# Patient Record
Sex: Male | Born: 1940 | Race: White | Hispanic: No | Marital: Married | Smoking: Former smoker
Health system: Southern US, Community
[De-identification: ages and names within clinical notes are randomized; demographics above are authoritative.]

## PROBLEM LIST (undated history)

## (undated) DIAGNOSIS — J449 Chronic obstructive pulmonary disease, unspecified: Secondary | ICD-10-CM

## (undated) DIAGNOSIS — D689 Coagulation defect, unspecified: Secondary | ICD-10-CM

## (undated) DIAGNOSIS — M1990 Unspecified osteoarthritis, unspecified site: Secondary | ICD-10-CM

## (undated) DIAGNOSIS — E785 Hyperlipidemia, unspecified: Secondary | ICD-10-CM

## (undated) DIAGNOSIS — I10 Essential (primary) hypertension: Secondary | ICD-10-CM

## (undated) DIAGNOSIS — I872 Venous insufficiency (chronic) (peripheral): Secondary | ICD-10-CM

## (undated) DIAGNOSIS — I719 Aortic aneurysm of unspecified site, without rupture: Secondary | ICD-10-CM

## (undated) DIAGNOSIS — K819 Cholecystitis, unspecified: Secondary | ICD-10-CM

## (undated) DIAGNOSIS — I714 Abdominal aortic aneurysm, without rupture, unspecified: Secondary | ICD-10-CM

## (undated) DIAGNOSIS — R918 Other nonspecific abnormal finding of lung field: Secondary | ICD-10-CM

## (undated) DIAGNOSIS — E669 Obesity, unspecified: Secondary | ICD-10-CM

## (undated) HISTORY — DX: Unspecified osteoarthritis, unspecified site: M19.90

## (undated) HISTORY — DX: Venous insufficiency (chronic) (peripheral): I87.2

## (undated) HISTORY — PX: CHOLECYSTECTOMY: SHX55

## (undated) HISTORY — DX: Other nonspecific abnormal finding of lung field: R91.8

## (undated) HISTORY — DX: Coagulation defect, unspecified: D68.9

## (undated) HISTORY — DX: Aortic aneurysm of unspecified site, without rupture: I71.9

## (undated) HISTORY — DX: Obesity, unspecified: E66.9

## (undated) HISTORY — DX: Abdominal aortic aneurysm, without rupture, unspecified: I71.40

## (undated) HISTORY — DX: Hyperlipidemia, unspecified: E78.5

## (undated) HISTORY — DX: Essential (primary) hypertension: I10

## (undated) HISTORY — DX: Abdominal aortic aneurysm, without rupture: I71.4

## (undated) HISTORY — DX: Cholecystitis, unspecified: K81.9

## (undated) HISTORY — PX: INGUINAL HERNIA REPAIR: SUR1180

---

## 2001-08-27 ENCOUNTER — Ambulatory Visit (HOSPITAL_COMMUNITY): Admission: RE | Admit: 2001-08-27 | Discharge: 2001-08-27 | Payer: Self-pay | Admitting: Internal Medicine

## 2001-08-27 ENCOUNTER — Encounter: Payer: Self-pay | Admitting: Internal Medicine

## 2001-09-27 ENCOUNTER — Ambulatory Visit (HOSPITAL_COMMUNITY): Admission: RE | Admit: 2001-09-27 | Discharge: 2001-09-27 | Payer: Self-pay | Admitting: Internal Medicine

## 2001-09-27 ENCOUNTER — Encounter: Payer: Self-pay | Admitting: Internal Medicine

## 2002-02-14 HISTORY — PX: TESTICLE SURGERY: SHX794

## 2002-04-06 ENCOUNTER — Emergency Department (HOSPITAL_COMMUNITY): Admission: EM | Admit: 2002-04-06 | Discharge: 2002-04-06 | Payer: Self-pay | Admitting: *Deleted

## 2002-06-04 ENCOUNTER — Ambulatory Visit (HOSPITAL_COMMUNITY): Admission: RE | Admit: 2002-06-04 | Discharge: 2002-06-04 | Payer: Self-pay | Admitting: Internal Medicine

## 2002-06-04 ENCOUNTER — Encounter: Payer: Self-pay | Admitting: Internal Medicine

## 2003-07-03 ENCOUNTER — Ambulatory Visit (HOSPITAL_COMMUNITY): Admission: RE | Admit: 2003-07-03 | Discharge: 2003-07-03 | Payer: Self-pay | Admitting: General Surgery

## 2003-07-04 ENCOUNTER — Ambulatory Visit (HOSPITAL_COMMUNITY): Admission: RE | Admit: 2003-07-04 | Discharge: 2003-07-04 | Payer: Self-pay | Admitting: General Surgery

## 2003-07-24 ENCOUNTER — Ambulatory Visit (HOSPITAL_COMMUNITY): Admission: RE | Admit: 2003-07-24 | Discharge: 2003-07-24 | Payer: Self-pay | Admitting: General Surgery

## 2003-12-29 ENCOUNTER — Ambulatory Visit (HOSPITAL_COMMUNITY): Admission: RE | Admit: 2003-12-29 | Discharge: 2003-12-29 | Payer: Self-pay | Admitting: Internal Medicine

## 2004-02-11 ENCOUNTER — Ambulatory Visit: Payer: Self-pay | Admitting: *Deleted

## 2004-03-15 ENCOUNTER — Ambulatory Visit (HOSPITAL_COMMUNITY): Admission: RE | Admit: 2004-03-15 | Discharge: 2004-03-15 | Payer: Self-pay | Admitting: Internal Medicine

## 2004-05-04 ENCOUNTER — Encounter (HOSPITAL_COMMUNITY): Admission: RE | Admit: 2004-05-04 | Discharge: 2004-06-03 | Payer: Self-pay | Admitting: Neurosurgery

## 2005-04-29 ENCOUNTER — Encounter: Payer: Self-pay | Admitting: Cardiology

## 2005-08-18 ENCOUNTER — Inpatient Hospital Stay (HOSPITAL_COMMUNITY): Admission: AD | Admit: 2005-08-18 | Discharge: 2005-08-27 | Payer: Self-pay | Admitting: Internal Medicine

## 2005-08-28 ENCOUNTER — Ambulatory Visit (HOSPITAL_COMMUNITY): Admission: RE | Admit: 2005-08-28 | Discharge: 2005-08-28 | Payer: Self-pay | Admitting: Urology

## 2005-08-29 ENCOUNTER — Ambulatory Visit (HOSPITAL_COMMUNITY): Payer: Self-pay | Admitting: Internal Medicine

## 2005-08-30 ENCOUNTER — Ambulatory Visit (HOSPITAL_COMMUNITY): Payer: Self-pay | Admitting: Oncology

## 2005-08-30 ENCOUNTER — Encounter (HOSPITAL_COMMUNITY): Admission: RE | Admit: 2005-08-30 | Discharge: 2005-09-29 | Payer: Self-pay | Admitting: Internal Medicine

## 2006-01-03 ENCOUNTER — Encounter (INDEPENDENT_AMBULATORY_CARE_PROVIDER_SITE_OTHER): Payer: Self-pay | Admitting: *Deleted

## 2006-01-03 ENCOUNTER — Ambulatory Visit (HOSPITAL_COMMUNITY): Admission: RE | Admit: 2006-01-03 | Discharge: 2006-01-03 | Payer: Self-pay | Admitting: Urology

## 2007-06-01 ENCOUNTER — Ambulatory Visit: Payer: Self-pay | Admitting: Vascular Surgery

## 2008-01-03 ENCOUNTER — Ambulatory Visit (HOSPITAL_COMMUNITY): Admission: RE | Admit: 2008-01-03 | Discharge: 2008-01-03 | Payer: Self-pay | Admitting: Ophthalmology

## 2008-07-18 ENCOUNTER — Ambulatory Visit: Payer: Self-pay | Admitting: Vascular Surgery

## 2008-10-11 ENCOUNTER — Inpatient Hospital Stay (HOSPITAL_COMMUNITY): Admission: EM | Admit: 2008-10-11 | Discharge: 2008-10-13 | Payer: Self-pay | Admitting: Emergency Medicine

## 2008-10-22 ENCOUNTER — Encounter (INDEPENDENT_AMBULATORY_CARE_PROVIDER_SITE_OTHER): Payer: Self-pay | Admitting: General Surgery

## 2008-10-22 ENCOUNTER — Ambulatory Visit (HOSPITAL_COMMUNITY): Admission: RE | Admit: 2008-10-22 | Discharge: 2008-10-22 | Payer: Self-pay | Admitting: General Surgery

## 2010-03-26 ENCOUNTER — Ambulatory Visit (HOSPITAL_COMMUNITY)
Admission: RE | Admit: 2010-03-26 | Discharge: 2010-03-26 | Disposition: A | Discharge status: Home / Self Care | Payer: Medicare Other | Source: Ambulatory Visit | Attending: Family Medicine | Admitting: Family Medicine

## 2010-03-26 ENCOUNTER — Encounter (HOSPITAL_COMMUNITY): Payer: Self-pay

## 2010-03-26 ENCOUNTER — Other Ambulatory Visit (HOSPITAL_COMMUNITY): Payer: Self-pay | Admitting: Family Medicine

## 2010-03-26 DIAGNOSIS — R0789 Other chest pain: Secondary | ICD-10-CM | POA: Insufficient documentation

## 2010-03-26 DIAGNOSIS — W19XXXA Unspecified fall, initial encounter: Secondary | ICD-10-CM | POA: Insufficient documentation

## 2010-03-26 DIAGNOSIS — S8000XA Contusion of unspecified knee, initial encounter: Secondary | ICD-10-CM | POA: Insufficient documentation

## 2010-03-26 DIAGNOSIS — M25569 Pain in unspecified knee: Secondary | ICD-10-CM | POA: Insufficient documentation

## 2010-03-26 DIAGNOSIS — S298XXA Other specified injuries of thorax, initial encounter: Secondary | ICD-10-CM | POA: Insufficient documentation

## 2010-04-12 ENCOUNTER — Other Ambulatory Visit (HOSPITAL_COMMUNITY): Payer: Self-pay | Admitting: Internal Medicine

## 2010-04-12 DIAGNOSIS — R0781 Pleurodynia: Secondary | ICD-10-CM

## 2010-04-14 ENCOUNTER — Ambulatory Visit (HOSPITAL_COMMUNITY)
Admission: RE | Admit: 2010-04-14 | Discharge: 2010-04-14 | Disposition: A | Discharge status: Home / Self Care | Payer: Medicare Other | Source: Ambulatory Visit | Attending: Internal Medicine | Admitting: Internal Medicine

## 2010-04-14 DIAGNOSIS — R0781 Pleurodynia: Secondary | ICD-10-CM

## 2010-04-14 DIAGNOSIS — S2249XA Multiple fractures of ribs, unspecified side, initial encounter for closed fracture: Secondary | ICD-10-CM | POA: Insufficient documentation

## 2010-04-14 DIAGNOSIS — R918 Other nonspecific abnormal finding of lung field: Secondary | ICD-10-CM | POA: Insufficient documentation

## 2010-04-14 DIAGNOSIS — W19XXXA Unspecified fall, initial encounter: Secondary | ICD-10-CM | POA: Insufficient documentation

## 2010-04-14 DIAGNOSIS — R0789 Other chest pain: Secondary | ICD-10-CM | POA: Insufficient documentation

## 2010-04-14 DIAGNOSIS — I712 Thoracic aortic aneurysm, without rupture, unspecified: Secondary | ICD-10-CM | POA: Insufficient documentation

## 2010-04-14 DIAGNOSIS — M549 Dorsalgia, unspecified: Secondary | ICD-10-CM | POA: Insufficient documentation

## 2010-04-14 MED ORDER — IOHEXOL 350 MG/ML SOLN: 100.0000 mL | Freq: Once | INTRAVENOUS | Status: AC | PRN

## 2010-04-21 ENCOUNTER — Encounter: Payer: Medicare Other | Admitting: Thoracic Surgery (Cardiothoracic Vascular Surgery)

## 2010-04-28 ENCOUNTER — Encounter (INDEPENDENT_AMBULATORY_CARE_PROVIDER_SITE_OTHER): Payer: Medicare Other | Admitting: Thoracic Surgery (Cardiothoracic Vascular Surgery)

## 2010-04-28 DIAGNOSIS — I712 Thoracic aortic aneurysm, without rupture: Secondary | ICD-10-CM

## 2010-04-28 DIAGNOSIS — D381 Neoplasm of uncertain behavior of trachea, bronchus and lung: Secondary | ICD-10-CM

## 2010-04-29 NOTE — Letter (Signed)
April 28, 2010  Madelin Rear. Sherwood Gambler, MD [address] [place], [place] [address]  Re:  [name], [name]                DOB:  [birth year]  Dear Dr. Sherwood Gambler:  Thank you very much for sending the patient for consultation.  As you know, he recently had a CT scan after suffering a fall, which showed a 4.2 cm ascending aorta.  He has already been followed for an abdominal aortic aneurysm by Dr. Gretta Began, but that has not which is necessitating surgery.  He also was noted to have a couple of small lung nodules 5 mm and 3 mm respectively on the CT scan.  There is no indication for surgery on either the lung nodules or the ascending aorta at this point in time, and I have recommended that we will repeat a CT angio in 6 months to look at both the aorta and lung nodules, and I will plan to see him back in the office at that time.  Again, thank you for referring the patient, and please do not hesitate to contact me if I could be of any further assistance with his care in the interim.  Salvatore Decent Dorris Fetch, M.D. Electronically Signed  SCH/MEDQ  D:  04/28/2010  T:  04/29/2010  Job:  [id number]  cc:   Ramon Dredge L. Juanetta Gosling, M.D.

## 2010-04-29 NOTE — Consult Note (Signed)
NEW PATIENT CONSULTATION  [name], [name] DOB:  May 31, 1940                                        April 28, 2010 CHART #:  [id number]  REASON FOR CONSULTATION:  Ascending aortic aneurysm.  HISTORY OF PRESENT ILLNESS:  The patient is a 70 year old gentleman with a history of abdominal aortic aneurysm which is followed by Dr. Tawanna Cooler Early, hypercholesterolemia, and DVT and venous insufficiency.  He was in his usual state of health until off 4 weeks ago when he lost his balance and fell, he hit his left knee, and the left side of his chest, he suffered multiple rib fractures, and evaluating him after that fall, a CT was done on April 14, 2010, which showed an enlarged ascending aorta at 4.2 cm.  Multiple rib fractures were noted, nondisplaced.  He was also noted to have 2 small lung nodules, one being 5 mm in size in the right lower lobe and one being 3 mm in size in the left lower lobe. There were no other significant findings.  PAST MEDICAL HISTORY:  Significant for abdominal aortic aneurysm, size unknown followed by Dr. Tawanna Cooler Early, hypercholesterolemia, DVT, varicose veins, chronic venous insufficiency, arthritis, remote tobacco abuse.  CURRENT MEDICATIONS: 1. Coumadin 9.5 mg daily. 2. Gabapentin 100 mg p.o. t.i.d. 3. Lasix 20 mg p.o. t.i.d.  He has allergies to PENICILLIN, SULFA, CLARITIN, and ASPIRIN.  FAMILY HISTORY:  Significant for his mother having heart attack at age 58.  SOCIAL HISTORY:  He is married with two grown children.  Lives with his wife.  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  He is a former smoker, he quit in 2001, and he does not drink alcohol.  REVIEW OF SYSTEMS:  Peripheral edema, pain in his legs, tiredness in his legs from varicose veins, back pain requiring gabapentin.  All other systems are negative.  PHYSICAL EXAMINATION:  GENERAL:  The patient is an obese 70 year old gentleman in no acute distress. VITAL  SIGNS:  Blood pressure is 116/81, pulse 51, respirations are 20, oxygen saturation is 94% on room air. NEUROLOGICAL:  He is alert and oriented x3 with no focal deficits. HEENT:  Unremarkable. NECK:  Supple without thyromegaly, adenopathy, or bruits. CARDIAC:  Has regular rate and rhythm.  Normal S1 and S2.  No murmurs, rubs, or gallops. LUNGS:  Clear with diminished, but equal breath sounds bilaterally.  He has severe venous insufficiency and severe varicosities in both lower extremities, not able to palpate dorsalis pedis or posterior tibial pulses in either leg.  CT of the chest is reviewed.  Findings as previously noted.  Other laboratory data showed a hemoglobin of 12.9, platelet count 339, sodium 140, potassium 4.6, BUN 21, creatinine 0.93, white count is 5.2.  TSH 0.771.  PSA 1.63, hemoglobin A1c 6.1%.  IMPRESSION:  The patient is a 70 year old gentleman, found on CT scan incidentally to have a 4.2-cm ascending aorta, this is definitely enlarged.  There is no indication for surgical treatment, but does need to be followed to rule out subsequent enlargement.  He is currently already being followed for an abdominal aortic aneurysm by Dr. Tawanna Cooler Early with 58-month ultrasounds.  He also was noted to have two small lung nodules; 5 mm in the right lower lobe, and 3 mm in the left lower lobe.  These lesions also  should be followed given his smoking history. Given the size of the lesions in his lungs and the smoking history, I think he should have a repeat CT scan in 6 months.  Apparently, Dr. Juanetta Gosling is recommend a scan at 3 months which is fine, but he will need a CT at 6 months for his aorta in any event, so hopefully we can just do one scan at that time, but we will defer to Dr. Juanetta Gosling, to see this physician following the lung lesions specifically.  I will plan to see the patient back in 6 months with a CT angio of the chest at that time.  Salvatore Decent Dorris Fetch, M.D. Electronically  Signed  SCH/MEDQ  D:  04/28/2010  T:  04/29/2010  Job:  [id number]  cc:   Madelin Rear. Sherwood Gambler, MD Oneal Deputy Juanetta Gosling, M.D.

## 2010-05-18 ENCOUNTER — Ambulatory Visit (HOSPITAL_COMMUNITY)
Admission: RE | Admit: 2010-05-18 | Discharge: 2010-05-18 | Disposition: A | Discharge status: Home / Self Care | Payer: Medicare Other | Source: Ambulatory Visit | Attending: Orthopaedic Surgery | Admitting: Orthopaedic Surgery

## 2010-05-18 DIAGNOSIS — S81809A Unspecified open wound, unspecified lower leg, initial encounter: Secondary | ICD-10-CM | POA: Insufficient documentation

## 2010-05-18 DIAGNOSIS — IMO0001 Reserved for inherently not codable concepts without codable children: Secondary | ICD-10-CM | POA: Insufficient documentation

## 2010-05-18 DIAGNOSIS — S81009A Unspecified open wound, unspecified knee, initial encounter: Secondary | ICD-10-CM | POA: Insufficient documentation

## 2010-05-20 ENCOUNTER — Ambulatory Visit (HOSPITAL_COMMUNITY)
Admission: RE | Admit: 2010-05-20 | Discharge: 2010-05-20 | Disposition: A | Discharge status: Home / Self Care | Payer: Medicare Other | Source: Ambulatory Visit | Admitting: Physical Therapy

## 2010-05-21 LAB — PROTIME-INR: INR: 1 (ref 0.00–1.49)

## 2010-05-22 LAB — URINALYSIS, MICROSCOPIC ONLY
Bilirubin Urine: NEGATIVE
Ketones, ur: NEGATIVE mg/dL
Leukocytes, UA: NEGATIVE
Nitrite: NEGATIVE
Specific Gravity, Urine: 1.035 — ABNORMAL HIGH (ref 1.005–1.030)
Urobilinogen, UA: 0.2 mg/dL (ref 0.0–1.0)

## 2010-05-22 LAB — DIFFERENTIAL
Basophils Absolute: 0 10*3/uL (ref 0.0–0.1)
Basophils Relative: 0 % (ref 0–1)
Eosinophils Absolute: 0 10*3/uL (ref 0.0–0.7)
Eosinophils Relative: 1 % (ref 0–5)
Lymphocytes Relative: 11 % — ABNORMAL LOW (ref 12–46)
Lymphs Abs: 0.9 10*3/uL (ref 0.7–4.0)
Monocytes Absolute: 0.5 10*3/uL (ref 0.1–1.0)
Monocytes Relative: 7 % (ref 3–12)
Neutrophils Relative %: 81 % — ABNORMAL HIGH (ref 43–77)

## 2010-05-22 LAB — BASIC METABOLIC PANEL
BUN: 12 mg/dL (ref 6–23)
Creatinine, Ser: 1.05 mg/dL (ref 0.4–1.5)
GFR calc non Af Amer: >60 mL/min (ref >60)
GFR calc non Af Amer: >60 mL/min (ref >60)
Glucose, Bld: 102 mg/dL — ABNORMAL HIGH (ref 70–99)
Glucose, Bld: 123 mg/dL — ABNORMAL HIGH (ref 70–99)
Potassium: 4.1 mEq/L (ref 3.5–5.1)
Potassium: 4.3 mEq/L (ref 3.5–5.1)
Sodium: 138 mEq/L (ref 135–145)

## 2010-05-22 LAB — HEPATIC FUNCTION PANEL
ALT: 20 U/L (ref 0–53)
AST: 32 U/L (ref 0–37)
Albumin: 3.6 g/dL (ref 3.5–5.2)
Alkaline Phosphatase: 41 U/L (ref 39–117)
Bilirubin, Direct: 0.2 mg/dL (ref 0.0–0.3)
Total Bilirubin: 1 mg/dL (ref 0.3–1.2)

## 2010-05-22 LAB — BRAIN NATRIURETIC PEPTIDE: Pro B Natriuretic peptide (BNP): 96 pg/mL (ref 0.0–100.0)

## 2010-05-22 LAB — CK TOTAL AND CKMB (NOT AT ARMC)
CK, MB: 1.9 ng/mL (ref 0.3–4.0)
Relative Index: 1.2 (ref 0.0–2.5)
Relative Index: 1.7 (ref 0.0–2.5)
Total CK: 163 U/L (ref 7–232)

## 2010-05-22 LAB — TROPONIN I
Troponin I: 0.01 ng/mL (ref 0.00–0.06)
Troponin I: 0.02 ng/mL (ref 0.00–0.06)

## 2010-05-22 LAB — CBC
HCT: 42.6 % (ref 39.0–52.0)
Hemoglobin: 14.4 g/dL (ref 13.0–17.0)
RDW: 14.6 % (ref 11.5–15.5)
WBC: 7.7 10*3/uL (ref 4.0–10.5)

## 2010-05-22 LAB — MAGNESIUM: Magnesium: 1.8 mg/dL (ref 1.5–2.5)

## 2010-05-22 LAB — POCT CARDIAC MARKERS: Troponin i, poc: <0.05 ng/mL (ref 0.00–0.09)

## 2010-05-22 LAB — PROTIME-INR: INR: 3 — ABNORMAL HIGH (ref 0.00–1.49)

## 2010-05-24 ENCOUNTER — Ambulatory Visit (HOSPITAL_COMMUNITY)
Admission: RE | Admit: 2010-05-24 | Discharge: 2010-05-24 | Disposition: A | Discharge status: Home / Self Care | Payer: Medicare Other | Source: Ambulatory Visit | Attending: *Deleted | Admitting: *Deleted

## 2010-05-26 ENCOUNTER — Ambulatory Visit (HOSPITAL_COMMUNITY)
Admission: RE | Admit: 2010-05-26 | Discharge: 2010-05-26 | Disposition: A | Discharge status: Home / Self Care | Payer: Medicare Other | Source: Ambulatory Visit | Attending: *Deleted | Admitting: *Deleted

## 2010-05-28 ENCOUNTER — Ambulatory Visit (HOSPITAL_COMMUNITY)
Admission: RE | Admit: 2010-05-28 | Discharge: 2010-05-28 | Disposition: A | Discharge status: Home / Self Care | Payer: Medicare Other | Source: Ambulatory Visit

## 2010-05-28 ENCOUNTER — Encounter: Payer: Self-pay | Admitting: Cardiology

## 2010-05-31 ENCOUNTER — Encounter: Payer: Self-pay | Admitting: Cardiology

## 2010-05-31 ENCOUNTER — Ambulatory Visit (INDEPENDENT_AMBULATORY_CARE_PROVIDER_SITE_OTHER): Payer: Medicare Other | Admitting: Cardiology

## 2010-05-31 ENCOUNTER — Ambulatory Visit (HOSPITAL_COMMUNITY)
Admission: RE | Admit: 2010-05-31 | Discharge: 2010-05-31 | Disposition: A | Discharge status: Home / Self Care | Payer: Medicare Other | Source: Ambulatory Visit | Attending: Internal Medicine | Admitting: Internal Medicine

## 2010-05-31 VITALS — BP 142/72 | HR 71 | Ht 70.0 in | Wt 272.0 lb

## 2010-05-31 DIAGNOSIS — R0600 Dyspnea, unspecified: Secondary | ICD-10-CM

## 2010-05-31 DIAGNOSIS — R0602 Shortness of breath: Secondary | ICD-10-CM

## 2010-05-31 NOTE — Patient Instructions (Addendum)
Your physician recommends that you schedule a follow-up appointment as needed   Your physician has requested that you have a lexiscan myoview. For further information please visit [email or web address]. Please follow instruction sheet, as given.    

## 2010-05-31 NOTE — Progress Notes (Signed)
   Patient ID: [name], male    DOB: Nov 20, 1940, 70 y.o.   MRN: [id number]  HPI Mr Buckwalter is a 70 yo MWM referred by Dr Sherwood Gambler for DOE. He has multiple CRF's including remote tobacco, obesity, family history, and sedentary life style. He recently fell in February and broke several ribs and injured his left knee. He has suffered from chronic lower extremity edema fro chronic venous insufficiency. He has a history of DVT and is on Coumadin. Recent Chest CT on Feb 29/12 showed no pulmonary embolus, AAA measuring 4.3 x 4.2 which is followed by Dr Dorris Fetch, and bilateral lowere lobe nodules followed by Dr Juanetta Gosling.  He denies angina but continues to have significant DOE.  EKG today shows NSR with LAD.    Review of Systems  Constitutional: Positive for activity change.  Eyes: Negative.   Respiratory: Positive for shortness of breath. Negative for chest tightness.   Cardiovascular: Negative for palpitations and leg swelling.  Genitourinary: Negative.   Musculoskeletal: Positive for gait problem.  Neurological: Negative for dizziness, syncope, speech difficulty, weakness and light-headedness.  Hematological: Negative.   Psychiatric/Behavioral: Negative.       Physical Exam  Constitutional: He is oriented to person, place, and time. He appears well-developed and well-nourished. No distress.       Morbidly obese  HENT:  Head: Atraumatic.  Eyes: EOM are normal. Pupils are equal, round, and reactive to light.  Neck: Normal range of motion. No JVD present. No tracheal deviation present. No thyromegaly present.       No bruits  Cardiovascular: Normal rate, regular rhythm, normal heart sounds and intact distal pulses.   No murmur heard. Pulmonary/Chest: Effort normal and breath sounds normal.  Abdominal: Soft. Bowel sounds are normal.       No bruit  Musculoskeletal: He exhibits edema.       Decreased ROM Chronic edematous changes  Neurological: He is alert and oriented to person,  place, and time.  Skin: Skin is warm and dry.

## 2010-06-02 ENCOUNTER — Ambulatory Visit (HOSPITAL_COMMUNITY)
Admission: RE | Admit: 2010-06-02 | Discharge: 2010-06-02 | Disposition: A | Discharge status: Home / Self Care | Payer: Medicare Other | Source: Ambulatory Visit | Attending: Internal Medicine | Admitting: Internal Medicine

## 2010-06-04 ENCOUNTER — Ambulatory Visit (HOSPITAL_COMMUNITY)
Admission: RE | Admit: 2010-06-04 | Discharge: 2010-06-04 | Disposition: A | Discharge status: Home / Self Care | Payer: Medicare Other | Source: Ambulatory Visit | Admitting: Physical Therapy

## 2010-06-07 ENCOUNTER — Ambulatory Visit (HOSPITAL_COMMUNITY)
Admission: RE | Admit: 2010-06-07 | Discharge: 2010-06-07 | Disposition: A | Discharge status: Home / Self Care | Payer: Medicare Other | Source: Ambulatory Visit | Attending: Internal Medicine | Admitting: Internal Medicine

## 2010-06-09 ENCOUNTER — Ambulatory Visit (HOSPITAL_COMMUNITY)
Admission: RE | Admit: 2010-06-09 | Discharge: 2010-06-09 | Disposition: A | Discharge status: Home / Self Care | Payer: Medicare Other | Source: Ambulatory Visit | Attending: Internal Medicine | Admitting: Internal Medicine

## 2010-06-09 ENCOUNTER — Other Ambulatory Visit (HOSPITAL_COMMUNITY): Payer: Medicare Other | Admitting: Radiology

## 2010-06-11 ENCOUNTER — Ambulatory Visit (HOSPITAL_COMMUNITY)
Admission: RE | Admit: 2010-06-11 | Discharge: 2010-06-11 | Disposition: A | Discharge status: Home / Self Care | Payer: Medicare Other | Source: Ambulatory Visit | Attending: Internal Medicine | Admitting: Internal Medicine

## 2010-06-15 ENCOUNTER — Ambulatory Visit (HOSPITAL_COMMUNITY)
Admission: RE | Admit: 2010-06-15 | Discharge: 2010-06-15 | Disposition: A | Discharge status: Home / Self Care | Payer: Medicare Other | Source: Ambulatory Visit | Attending: Orthopaedic Surgery | Admitting: Orthopaedic Surgery

## 2010-06-15 DIAGNOSIS — S81009A Unspecified open wound, unspecified knee, initial encounter: Secondary | ICD-10-CM | POA: Insufficient documentation

## 2010-06-15 DIAGNOSIS — IMO0001 Reserved for inherently not codable concepts without codable children: Secondary | ICD-10-CM | POA: Insufficient documentation

## 2010-06-16 ENCOUNTER — Ambulatory Visit (HOSPITAL_COMMUNITY)
Admission: RE | Admit: 2010-06-16 | Discharge: 2010-06-16 | Disposition: A | Discharge status: Home / Self Care | Payer: Medicare Other | Source: Ambulatory Visit | Attending: Internal Medicine | Admitting: Internal Medicine

## 2010-06-17 ENCOUNTER — Ambulatory Visit (HOSPITAL_COMMUNITY)
Admission: RE | Admit: 2010-06-17 | Discharge: 2010-06-17 | Disposition: A | Discharge status: Home / Self Care | Payer: Medicare Other | Source: Ambulatory Visit | Attending: Internal Medicine | Admitting: Internal Medicine

## 2010-06-21 ENCOUNTER — Ambulatory Visit (HOSPITAL_COMMUNITY)
Admission: RE | Admit: 2010-06-21 | Discharge: 2010-06-21 | Disposition: A | Discharge status: Home / Self Care | Payer: Medicare Other | Source: Ambulatory Visit | Attending: Internal Medicine | Admitting: Internal Medicine

## 2010-06-23 ENCOUNTER — Ambulatory Visit (HOSPITAL_COMMUNITY): Payer: Medicare Other | Admitting: *Deleted

## 2010-06-24 ENCOUNTER — Ambulatory Visit (HOSPITAL_COMMUNITY)
Admission: RE | Admit: 2010-06-24 | Discharge: 2010-06-24 | Disposition: A | Discharge status: Home / Self Care | Payer: Medicare Other | Source: Ambulatory Visit | Attending: Internal Medicine | Admitting: Internal Medicine

## 2010-06-25 ENCOUNTER — Ambulatory Visit (HOSPITAL_COMMUNITY): Payer: Medicare Other | Admitting: Physical Therapy

## 2010-06-28 ENCOUNTER — Ambulatory Visit (HOSPITAL_COMMUNITY): Payer: Medicare Other | Attending: Cardiology | Admitting: Radiology

## 2010-06-28 VITALS — Ht 69.5 in | Wt 280.0 lb

## 2010-06-28 DIAGNOSIS — R0602 Shortness of breath: Secondary | ICD-10-CM

## 2010-06-28 DIAGNOSIS — R0989 Other specified symptoms and signs involving the circulatory and respiratory systems: Secondary | ICD-10-CM | POA: Insufficient documentation

## 2010-06-28 DIAGNOSIS — R0609 Other forms of dyspnea: Secondary | ICD-10-CM | POA: Insufficient documentation

## 2010-06-28 DIAGNOSIS — R0600 Dyspnea, unspecified: Secondary | ICD-10-CM

## 2010-06-28 MED ORDER — TECHNETIUM TC 99M TETROFOSMIN IV KIT
33.0000 | PACK | Freq: Once | INTRAVENOUS | Status: AC | PRN
  Administered 2010-06-28: 33 via INTRAVENOUS

## 2010-06-28 MED ORDER — TECHNETIUM TC 99M TETROFOSMIN IV KIT
10.9000 | PACK | Freq: Once | INTRAVENOUS | Status: AC | PRN
  Administered 2010-06-28: 10.9 via INTRAVENOUS

## 2010-06-28 MED ORDER — REGADENOSON 0.4 MG/5ML IV SOLN
0.4000 mg | Freq: Once | INTRAVENOUS | Status: AC
  Administered 2010-06-28: 0.4 mg via INTRAVENOUS

## 2010-06-28 NOTE — Progress Notes (Signed)
[facility] SITE 3 NUCLEAR MED [address] [place] [address] [phone]  Cardiology Nuclear Med Study  [name] is a 70 y.o. male [id number] [birth year]   Nuclear Med Background Indication for Stress Test:  Evaluation for Ischemia History:  AAA  4.3 Cardiac Risk Factors: Family History - CAD, History of Smoking, Lipids and NIDDM  Symptoms:  DOE and SOB   Nuclear Pre-Procedure Caffeine/Decaff Intake:  None NPO After: 7:00am   Lungs:  clear IV 0.9% NS with Angio Cath:  20g  IV Site: R Antecubital  IV Started by:  Stanton Kidney, EMT-P  Chest Size (in):  52 Cup Size: n/a  Height: 5' 9.5" (1.765 m)  Weight:  280 lb (127.007 kg)  BMI:  Body mass index is 40.76 kg/(m^2). Tech Comments:  NA    Nuclear Med Study 1 or 2 day study: 1 day  Stress Test Type:  Eugenie Birks  Reading MD: Dietrich Pates, MD  Order Authorizing Provider:  T.Wall  Resting Radionuclide: Technetium 82m Tetrofosmin  Resting Radionuclide Dose: 10.9 mCi   Stress Radionuclide:  Technetium 60m Tetrofosmin  Stress Radionuclide Dose: 33 mCi           Stress Protocol Rest HR: 67 Stress HR: 76  Rest BP: 128/72 Stress BP: 135/71  Exercise Time (min): n/a METS: n/a   Predicted Max HR: 151 bpm % Max HR: 50.33 bpm Rate Pressure Product: 21308   Dose of Adenosine (mg):  n/a Dose of Lexiscan: 0.4 mg  Dose of Atropine (mg): n/a Dose of Dobutamine: n/a mcg/kg/min (at max HR)  Stress Test Technologist: Milana Na, EMT-P  Nuclear Technologist:  Domenic Polite, CNMT     Rest Procedure:  Myocardial perfusion imaging was performed at rest 45 minutes following the intravenous administration of Technetium 91m Tetrofosmin. Rest ECG: NSR  Stress Procedure:  The patient received IV Lexiscan 0.4 mg over 15-seconds.  Technetium 47m Tetrofosmin injected at 30-seconds.  There were no significant changes and occ pacs with Lexiscan.  Quantitative spect images were obtained after a 45 minute  delay. Stress ECG: No significant change from baseline ECG  QPS Raw Data Images:  Soft tissue (diaphragm) underlies heart. Stress Images:  Thinning with decreased counts in the inferior/inferolateral wall (base, mid); minimally at apex. Rest Images:  Comparison with the stress images reveals no significant change. Subtraction (SDS):  No evidence of ischemia. Transient Ischemic Dilatation (Normal <1.22):  1.19 Lung/Heart Ratio (Normal <0.45):  .28   Quantitative Gated Spect Images QGS EDV:  110 ml QGS ESV:  42 ml QGS cine images:  NL LV Function; NL Wall Motion QGS EF: 62%  Impression Exercise Capacity:  Lexiscan with no exercise. BP Response:  Normal blood pressure response. Clinical Symptoms:  No chest pain. ECG Impression:  No significant change from baseline. Comparison with Prior Nuclear Study: No previous nuclear study performed  Overall Impression:  Probable normal perfusion and mild soft tissue attenuation (diaphragm).  No ischemia.

## 2010-06-29 ENCOUNTER — Ambulatory Visit (HOSPITAL_COMMUNITY)
Admission: RE | Admit: 2010-06-29 | Discharge: 2010-06-29 | Disposition: A | Discharge status: Home / Self Care | Payer: Medicare Other | Source: Ambulatory Visit | Attending: Internal Medicine | Admitting: Internal Medicine

## 2010-06-29 NOTE — Discharge Summary (Signed)
NAMEADAMA, FERBER                ACCOUNT NO.:  [id number]   MEDICAL RECORD NO.:  [id number]          PATIENT TYPE:  INP   LOCATION:  3736                         FACILITY:  [facility]   PHYSICIAN:  Charlestine Massed, MDDATE OF BIRTH:  11/02/1940   DATE OF ADMISSION:  10/11/2008  DATE OF DISCHARGE:  10/13/2008                               DISCHARGE SUMMARY   PRIMARY CARE PHYSICIAN:  Madelin Rear. Sherwood Gambler, MD.   Leandra KernSandria Bales. Ezzard Standing, M.D.  Dalia Heading, M.D. at [facility].  Larina Earthly, M.D., vascular surgeon.   REASON FOR ADMISSION:  Pain in the epigastrium/chest.   DISCHARGE DIAGNOSIS:  1. Acute biliary colic, currently resolved.  2. Multiple gallstones with gallbladder outlet obstruction, currently      resolved, cholelithiasis.  3. Obesity.  4. Chronic lower extremity venous insufficiency on prophylactic      Coumadin therapy.  5. Abdominal aortic aneurysm at 3 cm, currently stable.  6. Previous history of smoking, currently no lung issues.  7. Previous history of left hydrocelectomy, circumcision and left      internal hernia repair.   DISCHARGE MEDICATIONS:  1. Cipro 500 mg p.o. b.i.d. for 3 days.  2. Tylenol 650 mg p.o. 4 times daily as needed for pain.  3. Coumadin 9.5 mg p.o. daily.  4. Lasix 60 mg p.o. daily.  5. Gabapentin 100 mg p.o. t.i.d.   HOSPITAL COURSE:  1. Cholelithiasis, acute biliary colic.  Mr. Pookela Sellin is a 70-year-      old gentleman who is obese and who came to the emergency room      because he experienced sudden onset of pain in the lower chest at      the epigastric area which was radiation between the shoulder blades      and to the midabdomen since the 11 o'clock on the night of the 27th      and so he came to the emergency room.  Pain was very sharp in      nature with a severity of 9-10/10 with no aggravating or relieving      factors.  He has not experienced this kind of pain before  The      patient was examined in the emergency  room.  The initial thought      was to rule out any dissection of the aneurysm as patient has prior      history of abdominal aortic aneurysm and the pain was in the chest      as well as the abdomen.  With the patient's history of lower      extremity venous insufficiency, a PE was also thought by the      emergency room but his INR was therapeutic.  The patient had a CT      chest done which was negative for pulmonary embolism and a CT      angiogram done for the dissection ruled out any thoracic or      abdominal aortic dissection and the aneurysm of his abdominal aorta      was stable.  The  patient's CAT scan also revealed evidence of a      distended gallbladder with a stone at the neck of the gallbladder.      So the diagnosis was made as acute biliary colic/chronic      cholecystitis.  There was no evidence of an acute cholecystitis      such as leukocytosis or fever or stranding of the pericholecystic      fluid.  Patient had a  HIDA scan done the next day which revealed  nonvisualization of gallbladder and gallbladder disease.  The patient  had an ultrasonogram done which also showed numerous gallstones and  stone in the neck of the gallbladder.  The patient was seen by Dr.  Ezzard Standing of [facility] who suggested the patient needs to  have a laparoscopic cholecystectomy.  In view of the fact that he is on  Coumadin, he needs to be admitted and reversed before he undergoes the  surgery.  The patient stated that he has had previous surgery with Dr.  Franky Macho at [facility] and he would like to have his  surgery over there.  So the patient currently is pain free, there is no  urgent need to transfer the patient to the hospital and he can have  elective surgery done over at [facility].  He has been advised to call  Dr. Lovell Sheehan today himself and get admitted for the surgery.  The patient  expressed understanding about it and he understands that the fact he can   get an other colic attack before he gets the surgery, at any time, and  he understands the urgency in calling the surgeon as soon as possible  and get himself operated.  1. Obesity.  The patient has been advised about weight loss and diet      instructions were to get diet counseling when he gets admitted      there.  We ordered counseling here and it is still pending.  2. Abdominal aortic aneurysm.  The patient has been followed up      routinely by ultrasound.  He had a CT scan that has been reported      and his last ultrasound reported it at 3.01 cm of the abdomen      aortic aneurysm.  The CAT scan done to rule out dissection also      showed there is no distention of the aortic aneurysm and it is at 3      cm at this time.  He will need to follow up with Dr. Tawanna Cooler Early      regularly for further follow-up of the aneurysm.  Blood pressures      were stable and was not hypertensive at any time.   DISCHARGE DISPOSITION:  Discharged back home.   FOLLOW UP:  1. Follow up with Dr. Franky Macho at [facility] for surgery      as soon as possible.  2. Follow up with Dr. Tawanna Cooler Early as instructed.  3. Follow up with Dr. Sherwood Gambler, PMD,  once the patient has completed      surgery.   DISCHARGE INSTRUCTIONS:  1. Call Dr. Franky Macho a.s.a.p. to get admitted for surgery.  2. Adhere to medications and if pain recurs go to the emergency room      a.s.a.p.   A total of 40 minutes spent on today's discharge.      Charlestine Massed, MD  Electronically Signed  UT/MEDQ  D:  10/13/2008  T:  10/13/2008  Job:  [id number]   cc:   Dalia Heading, M.D.  Madelin Rear. Sherwood Gambler, MD  Sandria Bales. Ezzard Standing, M.D.  Larina Earthly, M.D.

## 2010-06-29 NOTE — Consult Note (Signed)
[name], [name]                ACCOUNT NO.:  [id number]   MEDICAL RECORD NO.:  [id number]          PATIENT TYPE:  INP   LOCATION:  3736                         FACILITY:  [facility]   PHYSICIAN:  Sandria Bales. Ezzard Standing, M.D.  DATE OF BIRTH:  01/09/1941   DATE OF CONSULTATION:  10/12/2008  DATE OF DISCHARGE:                                 CONSULTATION   REASON FOR CONSULTATION:  Possible gallbladder disease.   CONSULTING PHYSICIAN:  Charlestine Massed, MD   HISTORY OF PRESENT ILLNESS:  Mr. Danis is a 70 year old white male and  who is followed by Dr. Sherwood Gambler in [place].  On Saturday, October 11, 2008, he developed severe chest pain, which went to his epigastrum and  radiated to his back.  He was admitted for a possible acute cardiac or  aortic event.  He had a myocardial infarction ruled out.  CT scan of his  abdomen showed a small aortic aneurysm about 3.1 cm, but not acute  process  (such as leaking), but he was found to have gallstones.   The gallstones are evaluated with a ultrasound, which does show  gallstones, but no thickening of gallbladder wall.  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  He denies any history of peptic ulcer  disease, liver disease, prior knowledge of gallbladder disease, or  pancreatic disease.  Apparently about 3 years ago, he was treated for  some diverticulitis by Dr. Sherwood Gambler, sound like as an outpatient.  He  underwent a colonoscopy about the same time by Dr. Franky Macho and he  was found to have a colonic polyp but recommended followup colonoscopy  in 5 years.   PAST MEDICAL HISTORY:  He is allergic to ASPIRIN, PENICILLIN, SULFA, AND  ANTIHISTAMINES.  His allergy to ASPIRIN caused prostate swelling some 40  years ago and PENICILLIN broke out over 40 years ago, and the SULFA  caused some urinary problems also.  On admission, he was on Coumadin, Lasix, and gabapentin.   REVIEW OF SYSTEMS:  NEUROLOGIC:  He has had no stroke or seizure.  He  does have chronic back pain with spinal problems.  He saw Dr. Hilda Lias about 3 years ago.  He takes gabapentin twice a day, he has been  doing this for about 7 months with no anticipated surgery.  PULMONARY:  He quit smoking about 9 years ago.  He has had no lung  disease or chest pain.  CARDIAC:  He has had no prior cardiac catheterization, heart attack.  He  was seen in [facility] about 3 years ago, just for an evaluation  by Dr. Dorethea Clan.  He is not chronically followed by [facility].  He has never  had a heart catheterization.  GASTROINTESTINAL:  See history of present illness.  UROLOGIC:  He had a left inguinal hernia by Dr. Lovell Sheehan in May 2005.  He  had a left hydrocelectomy and circumcision in November 2007 by Dr.  Jerre Simon.  He has had some urinary tract infections remotely.  He has had  circumcision.   He is retired from [employer] about 2-3 years ago.  His wife and  daughter are in the room on exam.   PHYSICAL EXAMINATION:  VITAL SIGNS:  His temperature is 97.8, pulse 63,  blood pressure 146/83.  GENERAL:  He is a well-nourished, but obese male.  He says his weight is  292, his height 5 feet 10 inches.  So, his calculated BMI is about 42.  HEENT:  Unremarkable.  NECK:  Supple.  No mass.  No thyromegaly.  LUNGS:  Clear to auscultation.  HEART:  Regular rate and rhythm without murmur or rub.  ABDOMEN:  Obese man with a pannus.  He has no localized tenderness,  guarding, or rebound.  EXTREMITIES:  He does have bruising on both arms and lower extremities  from the Coumadin he has been on and he has venous stasis disease of  both lower extremities.   LABORATORY DATA:  Labs that I have show a white blood count of 7700,  hemoglobin of 14.4, hematocrit of 42.6.  His creatinine is 1.15, serum  calcium 10.2.  He has had a series of troponins, most recently today was  0.02.  He had a lipase, which  was 35.  He has a bilirubin of 1.0 and  alkaline phosphatase of 42 with normal liver functions.   IMPRESSION:  1. 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  There is no evidence of any acute infection      and he does not need anything done acutely.  Since, he is on      Coumadin chronically, I think it will be worth him going home and      being set up for elective surgery in the next 3-6 weeks.   I discussed with him the indication and potential risk of gallbladder  surgery.  Potential risks include, but not limited to, bleeding,  infection, common bile duct injury and the possiblity of open surgery.  If we try to do a surgery laparoscopically, he would certainly be at  risk for open surgery and with his history of DVT, he will be at risk of  pulmonary embolism or cardiac issues.   He is familiar with Dr. Lovell Sheehan, appreciates his care in the past and he  is going to followup with Dr. Lovell Sheehan for his gallbladder surgery.  I  told him I would help in anyway to get information to Dr. Lovell Sheehan as  necessary, and Dr. Lovell Sheehan can access this information through the [facility]  System.  He will in contact with [place] only if he needs further assistance.  1. 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  2. Coumadin.  3. Chronic deep vein thrombosis.  His PT is 30, INR is 3.0.  I told      him he would probably have to stop his Coumadin 5-7 days      preoperatively and could probably restart a couple of days postop      depending on how the surgery goes.  4. Morbid obesity with a calculated BMI of approximately 42.  5. Chronic back pain.  He is probably seeing Dr. Jeral Fruit but it has      been a while.  6. Small abdominal aneurysm, which is asymptomatic, follow Dr. Arbie Cookey      on  an annual basis.  7. History of diverticulosis.      Sandria Bales. Ezzard Standing,  M.D.  Electronically Signed     DHN/MEDQ  D:  10/12/2008  T:  10/12/2008  Job:  [id number]   cc:   Charlestine Massed, MD  Dalia Heading, M.D.  Larina Earthly, M.D.  Hilda Lias, M.D.

## 2010-06-29 NOTE — H&P (Signed)
[name], [name]                ACCOUNT NO.:  [id number]   MEDICAL RECORD NO.:  [id number]          PATIENT TYPE:  AMB   LOCATION:  DAY                           FACILITY:  [facility]   PHYSICIAN:  Dalia Heading, M.D.  DATE OF BIRTH:  March 22, 1940   DATE OF ADMISSION:  10/14/2008  DATE OF DISCHARGE:  LH                              HISTORY & PHYSICAL   CHIEF COMPLAINT:  Cholecystitis, cholelithiasis.   HISTORY OF PRESENT ILLNESS:  The patient is a morbidly obese 70 year old  white male, who is referred for evaluation and treatment of biliary  colic secondary to cholelithiasis.  He was at [facility] for the past  few days with for workup of upper abdominal pain and chest pain.  Workup  was negative for pulmonary embolus or a cardiac etiology for his pain.  He was found to have cholelithiasis with a stone lodged the neck of the  gallbladder.  His liver enzyme tests were normal.  He was on Coumadin  for lower extremity stasis dermatitis and varicosities.  He has been  having intermittent right upper quadrant abdominal pain with radiation  to the flank, indigestion, and nausea for the past few months.  It was  made worse with fatty foods.  No fever, chills, or jaundice have been  noted.   PAST MEDICAL HISTORY:  1. Morbid obesity.  2. Venous insufficiency of the lower extremities.   PAST SURGICAL HISTORY:  Left inguinal herniorrhaphy in the remote past.   CURRENT MEDICATIONS:  1. Lasix 60 mg p.o. daily.  2. Warfarin 9.5 mg p.o. daily.  3. Gabapentin 1 tablet p.o. daily   ALLERGIES:  SULFA, PENICILLIN, ANTIHISTAMINES, ASPIRIN.   REVIEW OF SYSTEMS:  The patient denies drinking or smoking.  Denies any  recent MI, CVA, or diabetes mellitus.  Denies any bleeding disorders.   PHYSICAL EXAMINATION:  GENERAL:  The patient is a morbidly obese white  male in no acute distress.  HEENT:  No scleral icterus.  LUNGS:  Clear to auscultation with equal breath sounds bilaterally.  HEART:  Regular  rate and rhythm without S3, S4, or murmurs.  ABDOMEN:  Soft, nontender, nondistended.  No hepatosplenomegaly, masses,  hernias are identified.  CT scan of the abdomen reveals cholelithiasis  with a normal common bile duct.   IMPRESSION:  Cholecystitis, cholelithiasis.   PLAN:  The patient is scheduled for a laparoscopic cholecystectomy on  October 22, 2008.  Risks and benefits of the procedure including  bleeding, infection, hepatobiliary injury, cardiopulmonary difficulties,  the possibility of an open procedure were fully explained to the  patient, gave informed consent.  He is to stop his Coumadin on October 17, 2008, as he only takes it for a venous insufficiency of the lower  extremities.      Dalia Heading, M.D.  Electronically Signed     MAJ/MEDQ  D:  10/14/2008  T:  10/15/2008  Job:  [id number]   cc:   Madelin Rear. Sherwood Gambler, MD  Fax: [phone]

## 2010-06-29 NOTE — H&P (Signed)
[name], [name]                ACCOUNT NO.:  [id number]   MEDICAL RECORD NO.:  [id number]          PATIENT TYPE:  EMS   LOCATION:  MAJO                         FACILITY:  [facility]   PHYSICIAN:  Charlestine Massed, MDDATE OF BIRTH:  Apr 10, 1940   DATE OF ADMISSION:  10/11/2008  DATE OF DISCHARGE:                              HISTORY & PHYSICAL   PRIMARY CARE PHYSICIAN:  Unassigned to [facility].  PMD- Dr.Lawrence Bentley   VASCULAR SURGERY:  Being followed up  surgeons at [facility]- Dr. Tawanna Cooler Bentley   CHIEF COMPLAINT:  Pain in the chest and the lower epigastric area.   HISTORY OF PRESENT ILLNESS:  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 Shelva Bentley, of [facility] who  presented the emergency room with complaint of chest pain.  The patient  states that at 11:00 at night, suddenly he started getting pain in the  chest.  The pain is mainly present in the lower end of the sternum and  the epigastric area.  It is no radiating.  It is severity of 9 to 10 out  of 10 and the pain is constant.  There are no associated symptoms such  as shortness of breath, loss of consciousness or palpitations.  The pain  is sharp in nature and is radiating to the area between both shoulder  blades and it is also radiating to the lower abdomen and the back.  The  patient says the pain is continuously present and there was no relief  with anything he could do and with any medications.  There is no nausea,  vomiting or diarrhea.  No diaphoresis.  No palpitations.  No cough, no  sputum production.  No fever.  No chills.   The patient has never had this kind of chest pain before.  He states  that the pain really scared him because of the severity.   The patient called EMS and EMS transferred the patient over to here.  The patient says that he was walking  around with the pain yesterday  morning and there was no change in the pain and rest did not  take the  pain away and the pain did occur first at rest.   PAST MEDICAL HISTORY:  1. Obesity.  2. Venous insufficiency in both legs and has been put on continuous      Coumadin prophylaxis by vascular surgeon.  The patient has NEVER      been diagnosed with DVT or PE.   PAST SURGICAL HISTORY:  1. Left hydrocele, circumcision, hydrocelectomy on the left.  2. Left inguinal hernia repair.   SOCIAL HISTORY:  He  quit smoking 9 years ago.  Before that he smoked  for a total of 50 years, one pack per day.  No alcohol intake and no  drug use.  He lives with his wife and is totally independent.   FAMILY HISTORY:  Mother had MI at age of 73, but no other  significant  cardiac history.  No history of cancer in the family.   FAMILY HISTORY:  No family history of cancer.   ALLERGIES:  ASPIRIN, PENICILLIN, SULFA AND ANTIHISTAMINES.   REVIEW OF SYSTEMS:  A 10-point review of all systems done.  Positive  pertinent features mentioned in the history of present illness and is  negative otherwise.   PHYSICAL EXAMINATION:  VITAL SIGNS:  Blood pressure 143/65, heart rate  56 per minute, respirations 18-20 per minute, temperature 98.2, O2  saturation 96%.  GENERAL:  The patient is alert, awake, oriented, not in any distress.  Pain is much better after pain medications.  Afebrile.  HEAD AND NECK:  Pupils reactive to light bilaterally.  No JVD.  Thick  neck No bruit heard.  No bleeding seen on nasal mucosa.  No oral thrush.  CHEST:  Bilateral air entry is good anteriorly and posteriorly.  No  palpable tenderness on the chest.  There are no rales or wheeze heard.  CARDIAC:  S1-S2 heard, regular, no murmurs.  ABDOMEN:  Soft.  There is no reproducible tenderness in epigastrium.  There is no tenderness in the right upper quadrant.  Bowel sounds are  normally heard.  Palpation of the abdomen, midabdomen because  of  aneurysm was not performed because of the fear of rupture and  the  patient did not complain of pain in that area.  No costovertebral angle  tenderness.  EXTREMITIES:  There is  chronic pedal edema.  There are distal veins  seen.  No ulcerations seen.  Chronic edematous changes present.  No  tenderness present.  CNS:  No obvious motor or sensory deficits.  Speech and comprehension  intact.  MUSCULOSKELETAL:  No tenderness present on the cervical spine or the  thoracic spine or lumbar spine.  Range of motion normal in all  extremities tested.  PSYCHIATRIC:  Stable mental status.   LABORATORY DATA:  1. BNP is 96.  2. Lipase is 35.  3. LFTs, bilirubin 1.0, indirect  0.8, alk phos 41, AST 32, ALT 20,      total protein 7.2, albumin 2.6.  4. Prothrombin time INR is 2.6.  5. CK is 103, MB is 1.8.  6. Troponin is 0.01.  7. Sodium 138, potassium 4.6, chloride 100, bicarb  27, BUN 18,      creatinine 1.15,  glucose 123 and calcium 10.2.  8. CBC:  WBC 7.7, hemoglobin 14.4, hematocrit 42.6 and platelet count      188.   1. CAT scan of the chest done  with IV contrast,  rule out  PE:      Negative for pulmonary embolism,  mild cardiomegaly.  2. CAT scan chest, abdomen and pelvis to rule out thoracic  aneurysm      or dissection was done.  No evidence of dissection. Some small      nodules seen in the lower lobe, for follow-up CT scan in 85M.  The      gallbladder is distended and there is a stone at the neck of the      gallbladder present and there is some questionable stranding      present.  So possible  findings of cholecystitis and the abdominal      aortic aneurysm is stable at 3.0 cm.  No other abdominal pathology      identified.   ASSESSMENT/PLAN:  1. Chest pain rule out myocardial infarction.  Will check CK, troponin      in view  of the risk factors the patient has.  2. 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  The      patient has choledocholithiasis.  Will do HIDA scan in a.m. and      decide about surgery after speaking with the  surgery team      tomorrow.  The patient will eventually need laparoscopic      cholecystectomy.  3. Previous  history of lower extremity venous insufficiency on      Coumadin therapy.  Will continue Coumadin for now and if surgery is      not warranted then will put the patient on Lovenox and Lovenox      bridging with stopping of Coumadin.  4. DVT prophylaxis.  Currently on Coumadin AND inr therapeutic.  5. Previous history of radiculopathy.  Continue Gabapentin as before.      Further outpatient follow-up.  6. History of  abdominal aortic aneurysm.  No further management at      this time.  Routine follow-up with vascular surgery warranted.  7.      Education:  Will get a proper diet education for weight reduction.  7. To prevent  contrast nephropathy - the patient has had two CAT      scans today.  Will continue IV fluids at 150 ml/ hour for 24 hours.      Will check BMET in the morning and watch his labs.   Total of 1 hour spent on admission.      Charlestine Massed, MD  Electronically Signed     UT/MEDQ  D:  10/11/2008  T:  10/11/2008  Job:  [id number]   cc:   Dalia Heading, M.D.  Sandria Bales. Ezzard Standing, M.D.  Larina Earthly, M.D.  Madelin Rear. Sherwood Gambler, MD

## 2010-06-29 NOTE — Procedures (Signed)
DUPLEX ULTRASOUND OF ABDOMINAL AORTA   INDICATION:  Followup abdominal aortic aneurysm.   HISTORY:  Diabetes:  No.  Cardiac:  No.  Hypertension:  No.  Smoking:  Previous.  Connective Tissue Disorder:  Family History:  No.  Previous Surgery:  No.   DUPLEX EXAM:         AP (cm)                   TRANSVERSE (cm)  Proximal             2.15 cm                   2.05 cm  Mid                  2.76 cm                   3.01 cm  Distal               1.85 cm                   1.94 cm  Right Iliac          1.29 cm                   1.34 cm  Left Iliac           1.28 cm                   1.40 cm   PREVIOUS:  Date:  06/01/2007  AP:  2.62  TRANSVERSE:  3.04   IMPRESSION:  1. Stable abdominal aortic aneurysm with largest diameter of 2.76 cm x      3.01 cm.  2. Note, technically difficult study due to body habitus, vessel depth      and bowel gas.   ___________________________________________  Larina Earthly, M.D.   AS/MEDQ  D:  07/18/2008  T:  07/18/2008  Job:  [id number]

## 2010-06-29 NOTE — Progress Notes (Signed)
NUCLEAR STUDY ROUTED TO DR.WALL.  [name], [name]

## 2010-06-29 NOTE — Procedures (Signed)
DUPLEX ULTRASOUND OF ABDOMINAL AORTA   INDICATION:  Followup of known AAA.   HISTORY:  Diabetes:  No.  Cardiac:  No.  Hypertension:  No.  Smoking:  No.  Connective Tissue Disorder:  Family History:  No.  Previous Surgery:  No.   DUPLEX EXAM:         AP (cm)                   TRANSVERSE (cm)  Proximal             1.39 cm                   1.39 cm  Mid                  2.55 cm                   2.55 cm  Distal               2.62 cm                   3.04 cm  Right Iliac          1.13 cm                   1.19 cm  Left Iliac           1.06 cm                   1.06 cm   PREVIOUS:  Date: 05/02/2005  AP:  3.0  TRANSVERSE:  2.8   IMPRESSION:  1. Technically difficult study due to patient's body habitus.  2. Essentially stable abdominal aortic aneurysm with maximum diameter      measuring 2.62 cm (AP) x 3.04 cm.  3. Ectatic CIAs bilaterally, right greater than left.   ___________________________________________  Larina Earthly, M.D.   PB/MEDQ  D:  06/01/2007  T:  06/01/2007  Job:  [id number]

## 2010-06-29 NOTE — Assessment & Plan Note (Signed)
OFFICE VISIT   [name], [name]  DOB:  02-06-1941                                       07/18/2008  NWGNF#:[id number]   The patient presents today for continued followup of a small infrarenal  abdominal aortic aneurysm.  He reports that he continues to remain in  good health with no new major medical difficulties.  His main disability  is arthritis.  He is on chronic Coumadin therapy for multiple prior  lower extremity DVTs and is extremely compliant with his graduated  compression garments.   PHYSICAL EXAMINATION:  Blood pressure is 130/88, pulse 56, respirations  16.  His radial pulses are 2+.  He does have palpable femoral and  popliteal pulses.  He does have some mild swelling.  I do not palpate  pedal pulses.  Abdominal exam reveals obesity.  I do not feel an  aneurysm.   He underwent ultrasound today and this reveals maximal diameter of 3.1  cm.  This is unchanged from his prior studies even dating back to 2006.  I have recommended we see him for 2 year intervals for noninvasive  ultrasound followup of his aorta.  I explained that his risk for  difficulty with this small size approaches zero and would recommend  continued followup.  Also of note, the wife is here today and she  reports that she has had a positive moderate narrowing in her carotid  arteries at a [facility] screen and requests ultrasound here at our  office.  She reports that she has a strong family history of stroke and  is quite concerned regarding this.  We will schedule her for a followup  ultrasound lab only at her convenience.   Larina Earthly, M.D.  Electronically Signed   TFE/MEDQ  D:  07/18/2008  T:  07/18/2008  Job:  [id number]   cc:   Madelin Rear. Sherwood Gambler, MD

## 2010-07-01 ENCOUNTER — Ambulatory Visit (HOSPITAL_COMMUNITY)
Admission: RE | Admit: 2010-07-01 | Discharge: 2010-07-01 | Disposition: A | Discharge status: Home / Self Care | Payer: Medicare Other | Source: Ambulatory Visit | Attending: Internal Medicine | Admitting: Internal Medicine

## 2010-07-02 NOTE — H&P (Signed)
[name], [name]                            ACCOUNT NO.:  [id number]   MEDICAL RECORD NO.:  [id number]                  PATIENT TYPE:   LOCATION:                                       FACILITY:   PHYSICIAN:  Dalia Heading, M.D.               DATE OF BIRTH:  [birth year]   DATE OF ADMISSION:  DATE OF DISCHARGE:                                HISTORY & PHYSICAL   CHIEF COMPLAINT:  Hematochezia.   HISTORY OF PRESENT ILLNESS:  The patient is a 70 year old white male who is  referred for evaluation and treatment of hematochezia.  This has been  present intermittently for many months.  There is no family history of colon  carcinoma, diarrhea, melena, weight loss or abdominal pain.  There is no  history of hemorrhoidal disease.  Occasional constipation is noted.  He has  never had a colonoscopy.   PAST MEDICAL HISTORY:  Includes venous stasis disease and hypertension.   PAST SURGICAL HISTORY:  Urologic surgery by Dr. Rito Ehrlich.   CURRENT MEDICATIONS:  1. Coumadin 7.5 mg p.o. daily.  2. Lasix 60 mg p.o. daily.   ALLERGIES:  PENICILLIN, SULFA, CLARITIN, ANTIHISTAMINES.   REVIEW OF SYSTEMS:  The patient denies drinking or smoking.  Denies any  recent angina, MI, CVA, diabetes mellitus or bleeding disorders except for  being on Coumadin.   PHYSICAL EXAMINATION:  GENERAL:  The patient is a moderately-obese white  male, in no acute distress.  VITAL SIGNS:  He is afebrile.  Vital signs are stable.  LUNGS:  Clear to auscultation with equal breath sounds bilaterally.  HEART:  Examination reveals regular rate and rhythm without S3, S4 or  murmurs.  ABDOMEN:  Soft, nontender, nondistended.  A reducible left inguinal hernia  is noted.  No right inguinal hernia is noted.  GU/RECTAL:  Examinations deferred to the procedure.   IMPRESSION:  Hematochezia.   PLAN:  1. The patient was scheduled for a colonoscopy on Jul 03, 2003.  2. The risks and benefits of the procedure including bleeding,  infection and     perforation were fully explained to the patient who gave informed     consent.  He will be stopping his Coumadin on Jun 30, 2003.     ___________________________________________                                         Dalia Heading, M.D.   MAJ/MEDQ  D:  06/19/2003  T:  06/19/2003  Job:  [id number]   cc:   Madelin Rear. Sherwood Gambler, M.D.  [address]  [place]  [place] [address]  Fax: [phone]

## 2010-07-02 NOTE — H&P (Signed)
[name], [name]                ACCOUNT NO.:  [id number]   MEDICAL RECORD NO.:  [id number]          PATIENT TYPE:  INP   LOCATION:  A326                          FACILITY:  [facility]   PHYSICIAN:  Madelin Rear. Sherwood Gambler, MD  DATE OF BIRTH:  1940-05-18   DATE OF ADMISSION:  08/18/2005  DATE OF DISCHARGE:  LH                                HISTORY & PHYSICAL   CHIEF COMPLAINT:  Fever, rigors and chills.   HISTORY OF PRESENT ILLNESS:  The patient was in his usual state of health  and developed sudden onset of fever, dysuria and true rigors over a 12-24  period of time.  He denies any vomiting.  He has had a little bit of  achiness and a sense of a polymyalgia.  No arthralgia or arthritis.   PAST MEDICAL HISTORY:  Severe venous insufficiency due to chronic deep  system disease, maintained on Coumadin for life-prophylaxis.  He is known to  be allergic to PENICILLIN and SULFA drugs.  He has also had a documented  abdominal aortic aneurysm at 3.1 cm as of surveillance March 15, 2004.  He  has been having ultrasounds and being followed by Vascular Surgery for this.   SOCIAL HISTORY:  Nonsmoker and nondrinker.  Married and lives with his wife.   FAMILY HISTORY:  Positive for diabetes and peripheral vascular disease, as  well as coronary artery disease.  He does have a sibling with breast cancer.  He has also had arthritis run in the family.   REVIEW OF SYSTEMS:  Under HPI.  All else is negative.   PHYSICAL EXAMINATION:  GENERAL:  He appears ill in the office.  HEAD AND NECK:  No JVD or adenopathy.  Neck is supple.  CHEST:  Clear.  CARDIAC EXAM:  Regular rhythm without murmur, gallop or rub.  ABDOMEN:  Soft.  No organomegaly or masses.  EXTREMITIES:  Chronic stasis changes and marked edema, which is his  baseline.  NEURO EXAM:  Nonfocal.   LABORATORY:  Pending at present and will be reviewed when available.   IMPRESSION:  Urosepsis with rapid course and clinical illness.   The patient  will be admitted for prompt IV antibiotic coverage, pending  cultures.  Expectant observation on his other problems of deep venous  insufficiency, chronic edema and Coumadin use.      Madelin Rear. Sherwood Gambler, MD  Electronically Signed     LJF/MEDQ  D:  08/18/2005  T:  08/18/2005  Job:  [id number]

## 2010-07-02 NOTE — H&P (Signed)
NAME:  [name], [name]                ACCOUNT NO.:  [id number]   MEDICAL RECORD NO.:  [id number]          PATIENT TYPE:  AMB   LOCATION:                                FACILITY:  [facility]   PHYSICIAN:  Ky Barban, M.D.DATE OF BIRTH:  [birth year]   DATE OF ADMISSION:  DATE OF DISCHARGE:  LH                              HISTORY & PHYSICAL   CHIEF COMPLAINT:  Left hydrocele and phimosis.   HISTORY:  A 70 year old gentleman who was initially seen in the hospital  for his urinary tract infection, and he has recovered from that  completely.  He has no voiding problem.  He does have a large hydrocele  which has gradually gotten worse, and he wants something done about  that.  It is uncomfortable he states and also wanted to get circumcised  at the same time, so he is coming as an outpatient to undergo left  hydrocelectomy with circumcision under anesthesia as an outpatient.  I  have discussed in detail procedures, limitations, complications with the  patient and his wife, then the same wanted me to go ahead and schedule  that.   PAST MEDICAL HISTORY:  He had right hydrocelectomy done several years  ago.  No history of diabetes or hypertension.  He does have abdominal  aortic aneurysm, which he did not know about that.  He has severe venous  insufficiency in both legs and has been on Coumadin prophylaxis.   ALLERGIES:  PENICILLIN AND SULFA DRUGS.   PHYSICAL EXAMINATION:  GENERAL:  Moderately obese.  VITAL SIGNS:  Blood pressure 130/80.  Temperature is normal.  CENTRAL NERVOUS SYSTEM:  Negative.  HEAD, NECK, ENT:  Negative.  CHEST:  Clear.  HEART:  Regular sinus rhythm, no murmur.  ABDOMEN:  Soft, flat.  Liver, spleen, kidneys are not palpable.  EXTERNAL GENITALIA:  Uncircumcised, tight foreskin, meatus is adequate.  Right testicle is normal.  Left side has a large hydrocele which  transilluminates.  RECTAL:  Essentially negative.  EXTREMITIES:  He is cording over the lower extremity  from venous  insufficiency.   IMPRESSION:  Left hydrocele phimosis.   PLAN:  Circumcision and left hydrocelectomy under anesthesia as an  outpatient.      Ky Barban, M.D.  Electronically Signed     MIJ/MEDQ  D:  01/02/2006  T:  01/02/2006  Job:  [id number]

## 2010-07-02 NOTE — Op Note (Signed)
NAME:  [name], [name]                          ACCOUNT NO.:  [id number]   MEDICAL RECORD NO.:  [id number]                   PATIENT TYPE:  AMB   LOCATION:  DAY                                  FACILITY:  [facility]   PHYSICIAN:  Dalia Heading, M.D.               DATE OF BIRTH:  1940-09-23   DATE OF PROCEDURE:  07/04/2003  DATE OF DISCHARGE:                                 OPERATIVE REPORT   PREOPERATIVE DIAGNOSIS:  Left inguinal hernia.   POSTOPERATIVE DIAGNOSIS:  Left inguinal hernia, indirect.   PROCEDURE:  Left inguinal herniorrhaphy.   SURGEON:  Dalia Heading, M.D.   ANESTHESIA:  Spinal.   INDICATIONS FOR PROCEDURE:  The patient is a 70 year old white male who  presents with a symptomatic left inguinal hernia.  The risks and benefits of  the procedure, including bleeding, infection, and recurrence of the hernia  were fully explained to the patient who gave informed consent.   DESCRIPTION OF PROCEDURE:  The patient was placed in the supine position  after spinal anesthesia was administered.  The left groin region was prepped  and draped using the usual sterile technique with Betadine.  Surgical site  confirmation was performed.   A transverse incision was made in the left groin region down to the external  oblique aponeurosis.  The aponeurosis was incised to the external ring.  A  Penrose drain was placed around the spermatic cord.  The ilioinguinal nerve  was identified and retracted superiorly from the operative field.  A large  indirect hernia sac was freed away from the spermatic cord.  A high ligation  at the perineal reflection was performed using a 2-0 Prolene pursestring  suture.  The excess hernia sac was then excised and sent to pathology for  further examination.  A large size polypropylene plug was then placed in  this region.  An onlay polypropylene mesh patch was then placed along the  floor of the inguinal canal and secured superiorly to the conjoined tendon  and inferiorly to the shelving edge of Poupart's ligament using a 2-0  Novofil interrupted suture.  The internal ring was recreated using a 2-0  Novofil interrupted suture.  The external oblique aponeurosis was  reapproximated using a 2-0 Vicryl running suture.  The subcutaneous layer  was reapproximated using a 3-0 Vicryl interrupted suture.  Sensorcaine 0.5%  was instilled into the surrounding wound.  Staples, Betadine ointment, and a  dry sterile dressing were then applied.   All tape and needle counts were correct at the end of the procedure.  The  patient was transferred to the PACU in stable condition.   COMPLICATIONS:  None.   SPECIMENS:  Left inguinal hernia sac.   ESTIMATED BLOOD LOSS:  Minimal.      ___________________________________________  Dalia Heading, M.D.   MAJ/MEDQ  D:  07/04/2003  T:  07/04/2003  Job:  [id number]   cc:   Madelin Rear. Sherwood Gambler, M.D.  [address]  [place]  [place] [address]  Fax: [phone]

## 2010-07-02 NOTE — Discharge Summary (Signed)
[name], [name]                ACCOUNT NO.:  [id number]   MEDICAL RECORD NO.:  [id number]          PATIENT TYPE:  INP   LOCATION:  A326                          FACILITY:  [facility]   PHYSICIAN:  Madelin Rear. Sherwood Gambler, MD  DATE OF BIRTH:  Jan 15, 1941   DATE OF ADMISSION:  08/18/2005  DATE OF DISCHARGE:  07/14/2007LH                                 DISCHARGE SUMMARY   DIAGNOSIS:  1. Escherichia coli septicemia with positive blood cultures.  2. Systemic inflammatory response syndrome due to sepsis.  3. Long-term use of anticoagulants.  4. Abdominal aortic aneurysm, under surveillance.  5. Orchitis/epididymitis.  6. Hydrocele.  7. Prostatitis.   DISCHARGE MEDICATIONS:  1. Tylenol p.r.n.  2. Lasix 60 mg p.o. daily.  3. Coumadin 7.5 mg p.o. daily.  4. Cipro 500 mg p.o. b.i.d.  5. Flagyl 500 mg p.o. q.i.d.  6. Outpatient gentamycin x10 days.   SUMMARY:  The patient was admitted with fever, rigors and chills, and  abdominal pain.  He was noted on exam and laboratory studies to have acute  diverticulitis as well as a urinary tract infection.  He was seen in  consultation for a hydrocele and elevated PSA by Dr. Jerre Simon of urology.  During his course his fever gradually regressed.  He responded well to  antibiotics targeting both prostatitis and diverticulitis as well as urinary  tract infection.  Cultures showed E. coli positive blood culture.  He  continued to improve and was discharged on gentamycin outpatient and  followup in office with myself as well as Dr. Jerre Simon.      Madelin Rear. Sherwood Gambler, MD  Electronically Signed     LJF/MEDQ  D:  09/22/2005  T:  09/22/2005  Job:  [id number]

## 2010-07-02 NOTE — Consult Note (Signed)
[name], [name]                ACCOUNT NO.:  [id number]   MEDICAL RECORD NO.:  [id number]          PATIENT TYPE:  INP   LOCATION:  A326                          FACILITY:  [facility]   PHYSICIAN:  Ky Barban, M.D.DATE OF BIRTH:  [birth year]   DATE OF CONSULTATION:  DATE OF DISCHARGE:                                   CONSULTATION   CHIEF COMPLAINT:  High fever and chills.  A 70 year old gentleman was  admitted by Dr. Sherwood Gambler with high fever, chills, dysuria, and he said he is  not able to empty his bladder completely, but urinary flow is satisfactory.  He has no nocturia.  He had similar episode some time ago.  No history of  gross hematuria.  He was started on IV Levaquin.  His temperature is now  down.  He is feeling better.  His PSA was also found to be 22.67, markedly  elevated.  I was called in because of this, to see this patient.   PAST MEDICAL HISTORY:  1.  He had a similar episode several years ago.  2.  He had right hydrocelectomy several years ago, and now he feels that he      is having similar problems on the left side.  3.  No history of diabetes or hypertension.  4.  He does have abdominal aortic aneurysm, 3.1 cm, and being followed with      ultrasound by a vascular surgeon in [place].  5.  Severe venous insufficiency in both legs, and is on Coumadin for      prophylaxis.   ALLERGIES:  PENICILLIN AND SULFA DRUGS.   He is taking Coumadin.   PERSONAL HISTORY:  Does not smoke or drink.   FAMILY HISTORY:  Positive for diabetes, peripheral vascular disease, and  also coronary artery disease.   EXAMINATION:  Obese male, not in acute distress, fully conscious, alert and  oriented.  Blood pressure 125/76, temperature 99.9, pulse 76.  Abdomen is obese.  Liver, spleen, kidneys are not palpable.  External genitalia is circumcised, meatus adequate.  Right testicle is  normal.  Left side has high transilluminate hydrocele, slightly tender  testicle.  Rectal exam  is deferred.  Extremities show severe scarring in the lower legs from venous  insufficiency.   LABORATORY WORKUP:  Urine culture do show E. coli of 100,000 colonies.  Sensitivities are pending.  WBC count is 9000.  Initially on admission it  was 11,000.  Neutrophil count is still high at 90.  Hematocrit is 43.4.  Pro-  time is 19.9.  Electrolytes are sodium 135, potassium 3.9, chloride 102, CO2  is 26, BUN is 9, creatinine 1.1.  PSA is 22.9.   IMPRESSION:  1.  Urinary tract infection, possible benign prostatic hypertrophy.  2.  Elevated PSA.  3.  Left hydrocele.  4.  Chronic venous insufficiency, both extremities.   RECOMMENDATIONS:  CT scan, with and without contrast, continue IV  antibiotics.  Depending on CT report, will probably need cystoscopy and  prostate ultrasound and biopsy.   I appreciate Dr. Sherwood Gambler for letting me see this patient.  IMPRESSION:      Ky Barban, M.D.  Electronically Signed     MIJ/MEDQ  D:  08/21/2005  T:  08/21/2005  Job:  [id number]   cc:   Madelin Rear. Sherwood Gambler, MD  Fax: [phone]

## 2010-07-02 NOTE — Op Note (Signed)
[name], [name]                ACCOUNT NO.:  [id number]   MEDICAL RECORD NO.:  [id number]          PATIENT TYPE:  AMB   LOCATION:  DAY                           FACILITY:  [facility]   PHYSICIAN:  Ky Barban, M.D.DATE OF BIRTH:  10/08/1940   DATE OF PROCEDURE:  01/03/2006  DATE OF DISCHARGE:                               OPERATIVE REPORT   PREOPERATIVE DIAGNOSES:  1. Left hydrocele.  2. Phimosis.   POSTOPERATIVE DIAGNOSES:  1. Left hydrocele.  2. Phimosis.   PROCEDURES:  1. Left hydrocelectomy.  2. Circumcision.   ANESTHESIA:  Spinal.   The patient was given spinal anesthesia, placed in lithotomy position  after the usual prep and drape.  The left hemiscrotum was held by the  assistant.  Then using a #20 needle, it was aspirated clear.  Amber-  colored fluid came, out so I proceeded to do the hydrocelectomy.  A  transverse incision across the mid scrotum is made, carried down through  the fascial layers with the help of the cautery, the tunica vaginalis  exposed.  It was opened.  Amber-colored fluid came out.  The testicle  was delivered through this incision and the tunica vaginalis was  everted.  I can see there is still considerable inflammation on the  epididymis.  The testicle grossly looks normal.  The appendix testis was  excised.  The everted tunica vaginalis, I proceeded to imbricate that  with a running stitch of 2-0 Vicryl, and it was completely indicated in  complete circumference.  There was no bleeding going on.  The testicle  was replaced into the scrotum.  Fascial layers were closed with a  continuous stitch of 2-0 Vicryl.  Skin is closed with 2-0 chromic  horizontal mattress stitches.  The wound was cleaned and then applied  bacitracin ointment.  Now I proceeded to do the circumcision.  The  dorsal slit is made and the redundant prepuce circumferentially excised  leaving about 5 mm mucosa.  The bleeders were coagulated.  The skin and  the mucosa was  closed together with interrupted sutures of 2-0 chromic.  At the end the wound was cleaned and bacitracin was applied and it was  wrapped in a Vaseline gauze and then 2-inch plain.  The patient left the  operating room in satisfactory condition.      Ky Barban, M.D.  Electronically Signed    MIJ/MEDQ  D:  01/03/2006  T:  01/03/2006  Job:  [id number]

## 2010-07-05 ENCOUNTER — Ambulatory Visit (HOSPITAL_COMMUNITY)
Admission: RE | Admit: 2010-07-05 | Discharge: 2010-07-05 | Disposition: A | Discharge status: Home / Self Care | Payer: Medicare Other | Source: Ambulatory Visit | Admitting: Physical Therapy

## 2010-07-07 ENCOUNTER — Telehealth: Payer: Self-pay | Admitting: Cardiology

## 2010-07-07 NOTE — Telephone Encounter (Signed)
Pt had stress test a week ago Monday and has not gotten any test results.  Please call pt with same.

## 2010-07-08 ENCOUNTER — Ambulatory Visit (HOSPITAL_COMMUNITY)
Admission: RE | Admit: 2010-07-08 | Discharge: 2010-07-08 | Disposition: A | Discharge status: Home / Self Care | Payer: Medicare Other | Source: Ambulatory Visit | Admitting: *Deleted

## 2010-07-08 NOTE — Telephone Encounter (Signed)
Spoke with Dorathy Daft in medical records who received phone call from pt's wife requesting results of stress test.  No comments on stress test from Dr. Daleen Squibb. Will review with DOD or Dr. Tenny Craw who read test and call wife with results.  Wife's number is [phone].

## 2010-07-08 NOTE — Telephone Encounter (Signed)
Results reviewed with Dr. Riley Kill. Also paged Dr. Daleen Squibb to discuss. Left message for pt to call back.

## 2010-07-08 NOTE — Telephone Encounter (Signed)
Patient given  echo results. Patient aware that Dr. Daleen Squibb needs to review results and make recommendations in needed.

## 2010-07-09 NOTE — Telephone Encounter (Signed)
Pt rtn call from yesterday [phone]

## 2010-07-14 ENCOUNTER — Ambulatory Visit (HOSPITAL_COMMUNITY)
Admission: RE | Admit: 2010-07-14 | Discharge: 2010-07-14 | Disposition: A | Discharge status: Home / Self Care | Payer: Medicare Other | Source: Ambulatory Visit | Attending: Internal Medicine | Admitting: Internal Medicine

## 2010-07-21 ENCOUNTER — Ambulatory Visit (HOSPITAL_COMMUNITY)
Admission: RE | Admit: 2010-07-21 | Discharge: 2010-07-21 | Disposition: A | Discharge status: Home / Self Care | Payer: Medicare Other | Source: Ambulatory Visit | Attending: Orthopaedic Surgery | Admitting: Orthopaedic Surgery

## 2010-07-21 DIAGNOSIS — S81009A Unspecified open wound, unspecified knee, initial encounter: Secondary | ICD-10-CM | POA: Insufficient documentation

## 2010-07-21 DIAGNOSIS — S91009A Unspecified open wound, unspecified ankle, initial encounter: Secondary | ICD-10-CM | POA: Insufficient documentation

## 2010-07-21 DIAGNOSIS — IMO0001 Reserved for inherently not codable concepts without codable children: Secondary | ICD-10-CM | POA: Insufficient documentation

## 2010-07-28 ENCOUNTER — Inpatient Hospital Stay (HOSPITAL_COMMUNITY)
Admission: RE | Admit: 2010-07-28 | Discharge: 2010-07-28 | Disposition: A | Discharge status: Home / Self Care | Payer: Medicare Other | Source: Ambulatory Visit | Attending: Physical Therapy | Admitting: Physical Therapy

## 2010-08-04 ENCOUNTER — Ambulatory Visit (HOSPITAL_COMMUNITY)
Admission: RE | Admit: 2010-08-04 | Discharge: 2010-08-04 | Disposition: A | Discharge status: Home / Self Care | Payer: Medicare Other | Source: Ambulatory Visit | Attending: Internal Medicine | Admitting: Internal Medicine

## 2010-08-05 ENCOUNTER — Encounter (INDEPENDENT_AMBULATORY_CARE_PROVIDER_SITE_OTHER): Payer: Medicare Other

## 2010-08-05 DIAGNOSIS — I714 Abdominal aortic aneurysm, without rupture: Secondary | ICD-10-CM

## 2010-08-11 ENCOUNTER — Other Ambulatory Visit (HOSPITAL_COMMUNITY): Payer: Self-pay | Admitting: Pulmonary Disease

## 2010-08-11 DIAGNOSIS — R911 Solitary pulmonary nodule: Secondary | ICD-10-CM

## 2010-08-13 ENCOUNTER — Ambulatory Visit (HOSPITAL_COMMUNITY)
Admission: RE | Admit: 2010-08-13 | Discharge: 2010-08-13 | Disposition: A | Discharge status: Home / Self Care | Payer: Medicare Other | Source: Ambulatory Visit | Attending: Pulmonary Disease | Admitting: Pulmonary Disease

## 2010-08-13 DIAGNOSIS — Z87891 Personal history of nicotine dependence: Secondary | ICD-10-CM | POA: Insufficient documentation

## 2010-08-13 DIAGNOSIS — J984 Other disorders of lung: Secondary | ICD-10-CM | POA: Insufficient documentation

## 2010-08-13 DIAGNOSIS — R911 Solitary pulmonary nodule: Secondary | ICD-10-CM

## 2010-08-20 NOTE — Procedures (Unsigned)
DUPLEX ULTRASOUND OF ABDOMINAL AORTA  INDICATION:  AAA.  HISTORY: Diabetes:  No. Cardiac:  No. Hypertension:  No. Smoking:  Previously. Connective Tissue Disorder: Family History:  No. Previous Surgery:  No. Other:  Hyperlipidemia.  DUPLEX EXAM:         AP (cm)                   TRANSVERSE (cm) Proximal             Not visualized            Not visualized Mid                  2.53 cm                   2.50 cm Distal               1.86 cm                   1.75 cm Right Iliac          Not visualized            Not visualized Left Iliac           Not visualized            Not visualized  PREVIOUS:  Date: 07/18/2008  AP:  2.76  TRANSVERSE:  3.01  IMPRESSION: 1. Aneurysmal dilatation of the mid abdominal aorta with largest     measurements today of 2.53 X 2.50 cm. 2. Limited visualization due to patient body habitus, vessel depth,     and overlying bowel gas.  ___________________________________________ Larina Earthly, M.D.  EM/MEDQ  D:  08/05/2010  T:  08/05/2010  Job:  [id number]

## 2010-10-06 ENCOUNTER — Other Ambulatory Visit: Payer: Self-pay | Admitting: Thoracic Surgery (Cardiothoracic Vascular Surgery)

## 2010-10-06 DIAGNOSIS — I712 Thoracic aortic aneurysm, without rupture: Secondary | ICD-10-CM

## 2010-10-06 DIAGNOSIS — R911 Solitary pulmonary nodule: Secondary | ICD-10-CM

## 2010-10-14 ENCOUNTER — Encounter (HOSPITAL_COMMUNITY): Payer: Self-pay

## 2010-10-14 ENCOUNTER — Encounter (HOSPITAL_COMMUNITY)
Admission: RE | Admit: 2010-10-14 | Discharge: 2010-10-14 | Disposition: A | Discharge status: Home / Self Care | Payer: Medicare Other | Source: Ambulatory Visit | Attending: Ophthalmology | Admitting: Ophthalmology

## 2010-10-14 LAB — CBC
Hemoglobin: 15.1 g/dL (ref 13.0–17.0)
MCHC: 33.3 g/dL (ref 30.0–36.0)
Platelets: 209 10*3/uL (ref 150–400)
RDW: 14.7 % (ref 11.5–15.5)

## 2010-10-14 LAB — BASIC METABOLIC PANEL
GFR calc Af Amer: >60 mL/min (ref >60)
GFR calc non Af Amer: >60 mL/min (ref >60)
Potassium: 4 mEq/L (ref 3.5–5.1)
Sodium: 136 mEq/L (ref 135–145)

## 2010-10-14 NOTE — Patient Instructions (Addendum)
20 [name]  10/14/2010   Your procedure is scheduled on:  10/21/2010  Report to [facility] at  615  AM.  Call this number if you have problems the morning of surgery: [phone]   Remember:   Do not eat food:After Midnight.  Do not drink clear liquids: After Midnight.  Take these medicines the morning of surgery with A SIP OF WATER: prilosec   Do not wear jewelry, make-up or nail polish.  Do not wear lotions, powders, or perfumes. You may wear deodorant.  Do not shave 48 hours prior to surgery.  Do not bring valuables to the hospital.  Contacts, dentures or bridgework may not be worn into surgery.  Leave suitcase in the car. After surgery it may be brought to your room.  For patients admitted to the hospital, checkout time is 11:00 AM the day of discharge.   Patients discharged the day of surgery will not be allowed to drive home.  Name and phone number of your driver: none  Special Instructions: N/A   Please read over the following fact sheets that you were given: Pain Booklet, Surgical Site Infection Prevention, Anesthesia Post-op Instructions and Care and Recovery After Surgery PATIENT INSTRUCTIONS POST-ANESTHESIA  IMMEDIATELY FOLLOWING SURGERY:  Do not drive or operate machinery for the first twenty four hours after surgery.  Do not make any important decisions for twenty four hours after surgery or while taking narcotic pain medications or sedatives.  If you develop intractable nausea and vomiting or a severe headache please notify your doctor immediately.  FOLLOW-UP:  Please make an appointment with your surgeon as instructed. You do not need to follow up with anesthesia unless specifically instructed to do so.  WOUND CARE INSTRUCTIONS (if applicable):  Keep a dry clean dressing on the anesthesia/puncture wound site if there is drainage.  Once the wound has quit draining you may leave it open to air.  Generally you should leave the bandage intact for twenty four hours unless  there is drainage.  If the epidural site drains for more than 36-48 hours please call the anesthesia department.  QUESTIONS?:  Please feel free to call your physician or the hospital operator if you have any questions, and they will be happy to assist you.     [facility] Anesthesia Department [address] [place] [phone]

## 2010-10-21 ENCOUNTER — Encounter (HOSPITAL_COMMUNITY)
Admission: RE | Disposition: A | Discharge status: Home / Self Care | Payer: Self-pay | Source: Ambulatory Visit | Attending: Ophthalmology

## 2010-10-21 ENCOUNTER — Ambulatory Visit (HOSPITAL_COMMUNITY): Payer: Medicare Other | Admitting: Anesthesiology

## 2010-10-21 ENCOUNTER — Encounter (HOSPITAL_COMMUNITY): Payer: Self-pay | Admitting: Anesthesiology

## 2010-10-21 ENCOUNTER — Encounter (HOSPITAL_COMMUNITY): Payer: Self-pay | Admitting: *Deleted

## 2010-10-21 ENCOUNTER — Ambulatory Visit (HOSPITAL_COMMUNITY)
Admission: RE | Admit: 2010-10-21 | Discharge: 2010-10-21 | Disposition: A | Discharge status: Home / Self Care | Payer: Medicare Other | Source: Ambulatory Visit | Attending: Ophthalmology | Admitting: Ophthalmology

## 2010-10-21 ENCOUNTER — Encounter (HOSPITAL_COMMUNITY): Payer: Self-pay | Admitting: Ophthalmology

## 2010-10-21 DIAGNOSIS — H2510 Age-related nuclear cataract, unspecified eye: Secondary | ICD-10-CM | POA: Insufficient documentation

## 2010-10-21 DIAGNOSIS — E119 Type 2 diabetes mellitus without complications: Secondary | ICD-10-CM | POA: Insufficient documentation

## 2010-10-21 DIAGNOSIS — Z7901 Long term (current) use of anticoagulants: Secondary | ICD-10-CM | POA: Insufficient documentation

## 2010-10-21 DIAGNOSIS — Z01810 Encounter for preprocedural cardiovascular examination: Secondary | ICD-10-CM | POA: Insufficient documentation

## 2010-10-21 DIAGNOSIS — Z01812 Encounter for preprocedural laboratory examination: Secondary | ICD-10-CM | POA: Insufficient documentation

## 2010-10-21 DIAGNOSIS — E785 Hyperlipidemia, unspecified: Secondary | ICD-10-CM | POA: Insufficient documentation

## 2010-10-21 HISTORY — PX: CATARACT EXTRACTION W/PHACO: SHX586

## 2010-10-21 SURGERY — PHACOEMULSIFICATION, CATARACT, WITH IOL INSERTION
Anesthesia: Monitor Anesthesia Care | Site: Eye | Laterality: Left | Wound class: Clean

## 2010-10-21 MED ORDER — CYCLOPENTOLATE-PHENYLEPHRINE 0.2-1 % OP SOLN
1.0000 [drp] | OPHTHALMIC | Status: AC
  Administered 2010-10-21 (×3): 1 [drp] via OPHTHALMIC

## 2010-10-21 MED ORDER — LACTATED RINGERS IV SOLN
INTRAVENOUS | Status: DC
  Administered 2010-10-21: 1000 mL via INTRAVENOUS

## 2010-10-21 MED ORDER — NEOMYCIN-POLYMYXIN-DEXAMETH 3.5-10000-0.1 OP OINT
TOPICAL_OINTMENT | OPHTHALMIC | Status: AC
  Filled 2010-10-21: qty 3.5

## 2010-10-21 MED ORDER — LIDOCAINE HCL 3.5 % OP GEL
1.0000 "application " | Freq: Once | OPHTHALMIC | Status: AC
  Administered 2010-10-21: 1 via OPHTHALMIC

## 2010-10-21 MED ORDER — CARBACHOL 0.01 % IO SOLN
INTRAOCULAR | Status: AC
  Filled 2010-10-21: qty 1.5

## 2010-10-21 MED ORDER — CYCLOPENTOLATE-PHENYLEPHRINE 0.2-1 % OP SOLN
OPHTHALMIC | Status: AC
  Administered 2010-10-21: 1 [drp] via OPHTHALMIC
  Filled 2010-10-21: qty 2

## 2010-10-21 MED ORDER — BSS IO SOLN
INTRAOCULAR | Status: DC | PRN
  Administered 2010-10-21: 15 mL via OPHTHALMIC

## 2010-10-21 MED ORDER — NEOMYCIN-POLYMYXIN-DEXAMETH 0.1 % OP OINT
TOPICAL_OINTMENT | OPHTHALMIC | Status: DC | PRN
  Administered 2010-10-21: 1 via OPHTHALMIC

## 2010-10-21 MED ORDER — PHENYLEPHRINE HCL 2.5 % OP SOLN
OPHTHALMIC | Status: AC
  Administered 2010-10-21: 1 [drp] via OPHTHALMIC
  Filled 2010-10-21: qty 2

## 2010-10-21 MED ORDER — GLYCOPYRROLATE 0.2 MG/ML IJ SOLN
INTRAMUSCULAR | Status: DC | PRN
  Administered 2010-10-21: .2 mg via INTRAVENOUS

## 2010-10-21 MED ORDER — MIDAZOLAM HCL 2 MG/2ML IJ SOLN
1.0000 mg | INTRAMUSCULAR | Status: DC | PRN
  Administered 2010-10-21: 2 mg via INTRAVENOUS

## 2010-10-21 MED ORDER — TETRACAINE HCL 0.5 % OP SOLN
1.0000 [drp] | OPHTHALMIC | Status: AC
  Administered 2010-10-21 (×3): 1 [drp] via OPHTHALMIC

## 2010-10-21 MED ORDER — LACTATED RINGERS IV SOLN: INTRAVENOUS | Status: DC

## 2010-10-21 MED ORDER — PROVISC 10 MG/ML IO SOLN
INTRAOCULAR | Status: DC | PRN
  Administered 2010-10-21: 8.5 mg via OPHTHALMIC

## 2010-10-21 MED ORDER — MIDAZOLAM HCL 2 MG/2ML IJ SOLN
INTRAMUSCULAR | Status: AC
  Filled 2010-10-21: qty 2

## 2010-10-21 MED ORDER — LIDOCAINE HCL (PF) 1 % IJ SOLN
INTRAMUSCULAR | Status: DC | PRN
  Administered 2010-10-21: .3 mL

## 2010-10-21 MED ORDER — EPINEPHRINE HCL 1 MG/ML IJ SOLN
INTRAMUSCULAR | Status: AC
  Filled 2010-10-21: qty 1

## 2010-10-21 MED ORDER — LIDOCAINE HCL 3.5 % OP GEL
OPHTHALMIC | Status: DC | PRN
  Administered 2010-10-21: 1 via OPHTHALMIC

## 2010-10-21 MED ORDER — TETRACAINE HCL 0.5 % OP SOLN
OPHTHALMIC | Status: AC
  Administered 2010-10-21: 1 [drp] via OPHTHALMIC
  Filled 2010-10-21: qty 2

## 2010-10-21 MED ORDER — GLYCOPYRROLATE 0.2 MG/ML IJ SOLN
INTRAMUSCULAR | Status: AC
  Filled 2010-10-21: qty 1

## 2010-10-21 MED ORDER — LIDOCAINE HCL 3.5 % OP GEL
OPHTHALMIC | Status: AC
  Administered 2010-10-21: 1 via OPHTHALMIC
  Filled 2010-10-21: qty 5

## 2010-10-21 MED ORDER — PHENYLEPHRINE HCL 2.5 % OP SOLN
1.0000 [drp] | OPHTHALMIC | Status: AC
  Administered 2010-10-21 (×3): 1 [drp] via OPHTHALMIC

## 2010-10-21 MED ORDER — LIDOCAINE HCL (PF) 1 % IJ SOLN
INTRAMUSCULAR | Status: AC
  Filled 2010-10-21: qty 2

## 2010-10-21 MED ORDER — EPINEPHRINE HCL 1 MG/ML IJ SOLN
INTRAOCULAR | Status: DC | PRN
  Administered 2010-10-21: 08:00:00

## 2010-10-21 SURGICAL SUPPLY — 32 items
CAPSULAR TENSION RING-AMO (OPHTHALMIC RELATED) IMPLANT
CLOTH BEACON ORANGE TIMEOUT ST (SAFETY) ×1 IMPLANT
DUOVISC SYSTEM (INTRAOCULAR LENS)
EYE SHIELD UNIVERSAL CLEAR (GAUZE/BANDAGES/DRESSINGS) ×1 IMPLANT
GLOVE BIO SURGEON STRL SZ 6.5 (GLOVE) IMPLANT
GLOVE BIOGEL PI IND STRL 6.5 (GLOVE) IMPLANT
GLOVE BIOGEL PI IND STRL 7.0 (GLOVE) IMPLANT
GLOVE BIOGEL PI IND STRL 7.5 (GLOVE) IMPLANT
GLOVE BIOGEL PI INDICATOR 6.5 (GLOVE) ×1
GLOVE BIOGEL PI INDICATOR 7.0 (GLOVE)
GLOVE BIOGEL PI INDICATOR 7.5 (GLOVE)
GLOVE ECLIPSE 6.5 STRL STRAW (GLOVE) IMPLANT
GLOVE ECLIPSE 7.0 STRL STRAW (GLOVE) IMPLANT
GLOVE ECLIPSE 7.5 STRL STRAW (GLOVE) IMPLANT
GLOVE EXAM NITRILE LRG STRL (GLOVE) ×1 IMPLANT
GLOVE EXAM NITRILE MD LF STRL (GLOVE) IMPLANT
GLOVE SKINSENSE NS SZ6.5 (GLOVE)
GLOVE SKINSENSE NS SZ7.0 (GLOVE)
GLOVE SKINSENSE STRL SZ6.5 (GLOVE) IMPLANT
GLOVE SKINSENSE STRL SZ7.0 (GLOVE) IMPLANT
KIT VITRECTOMY (OPHTHALMIC RELATED) IMPLANT
PAD ARMBOARD 7.5X6 YLW CONV (MISCELLANEOUS) ×1 IMPLANT
PROC W NO LENS (INTRAOCULAR LENS)
PROC W SPEC LENS (INTRAOCULAR LENS)
PROCESS W NO LENS (INTRAOCULAR LENS) IMPLANT
PROCESS W SPEC LENS (INTRAOCULAR LENS) IMPLANT
RING MALYGIN (MISCELLANEOUS) IMPLANT
SIGHTPATH CAT PROC W REG LENS (Ophthalmic Related) ×2 IMPLANT
SYR TB 1ML LL NO SAFETY (SYRINGE) ×1 IMPLANT
SYSTEM DUOVISC (INTRAOCULAR LENS) IMPLANT
VISCOELASTIC ADDITIONAL (OPHTHALMIC RELATED) IMPLANT
WATER STERILE IRR 250ML POUR (IV SOLUTION) ×1 IMPLANT

## 2010-10-21 NOTE — Anesthesia Preprocedure Evaluation (Addendum)
Anesthesia Evaluation  Name, MR# and DOB Patient awake  General Assessment Comment  Reviewed: Allergy & Precautions, H&P , NPO status , Patient's Chart, lab work & pertinent test results  Airway Mallampati: II      Dental  (+) Edentulous Upper   Pulmonary (+) shortness of breath COPD  clear to auscultation  breath sounds clear to auscultation none    Cardiovascular + Peripheral Vascular Disease (AA repaired) Regular Normal    Neuro/Psych   GI/Hepatic/Renal        GERD      Endo/Other    Abdominal   Musculoskeletal   Hematology   Peds  Reproductive/Obstetrics    Anesthesia Other Findings             Anesthesia Physical Anesthesia Plan  ASA: III  Anesthesia Plan: MAC   Post-op Pain Management:    Induction: Intravenous  Airway Management Planned: Nasal Cannula  Additional Equipment:   Intra-op Plan:   Post-operative Plan:   Informed Consent: I have reviewed the patients History and Physical, chart, labs and discussed the procedure including the risks, benefits and alternatives for the proposed anesthesia with the patient or authorized representative who has indicated his/her understanding and acceptance.     Plan Discussed with:   Anesthesia Plan Comments:         Anesthesia Quick Evaluation

## 2010-10-21 NOTE — Anesthesia Postprocedure Evaluation (Signed)
  Anesthesia Post-op Note  Patient: [name]  Procedure(s) Performed:  CATARACT EXTRACTION PHACO AND INTRAOCULAR LENS PLACEMENT (IOC) - CDE:16.80  Patient Location:  Short Stay  Anesthesia Type: MAC  Level of Consciousness: awake  Airway and Oxygen Therapy: Patient Spontanous Breathing  Post-op Pain: none  Post-op Assessment: Post-op Vital signs reviewed, Patient's Cardiovascular Status Stable, Respiratory Function Stable, Patent Airway, No signs of Nausea or vomiting and Pain level controlled  Post-op Vital Signs: Reviewed and stable  Complications: No apparent anesthesia complications

## 2010-10-21 NOTE — OR Nursing (Signed)
Phase II Aldrete score 10

## 2010-10-21 NOTE — Anesthesia Procedure Notes (Addendum)
Procedure Name: MAC Date/Time: 10/21/2010 7:25 AM Performed by: Minerva Areola Oxygen Delivery Method: Nasal Cannula   Performed by: Minerva Areola Pre-anesthesia Checklist: Patient identified, Patient being monitored, Emergency Drugs available, Timeout performed and Suction available

## 2010-10-21 NOTE — Op Note (Signed)
NAMEBRAULIO, [name]                ACCOUNT NO.:  [id number]  MEDICAL RECORD NO.:  [id number]  LOCATION:  APPO                          FACILITY:  [facility]  PHYSICIAN:  Susanne Greenhouse, MD       DATE OF BIRTH:  1940-09-18  DATE OF PROCEDURE:  10/21/2010 DATE OF DISCHARGE:  10/21/2010                              OPERATIVE REPORT   PREOPERATIVE DIAGNOSIS:  Nuclear cataract, left eye, diagnosis code 366.16.  POSTOPERATIVE DIAGNOSIS:  Nuclear cataract, left eye, diagnosis code 366.16.  OPERATION PERFORMED:  Phacoemulsification with posterior chamber intraocular lens implantation, left eye.  SURGEON:  Bonne Dolores. Khadijah Mastrianni, MD  ANESTHESIA:  General endotracheal anesthesia.  OPERATIVE SUMMARY:  In the preoperative area, dilating drops were placed into the left eye.  The patient was then brought into the operating room where he was placed under general anesthesia.  The eye was then prepped and draped.  Beginning with a 75 blade, a paracentesis port was made at the surgeon's 2 o'clock position.  The anterior chamber was then filled with a 1% nonpreserved lidocaine solution with epinephrine.  This was followed by Viscoat to deepen the chamber.  A small fornix-based peritomy was performed superiorly.  Next, a single iris hook was placed through the limbus superiorly.  A 2.4-mm keratome blade was then used to make a clear corneal incision over the iris hook.  A bent cystotome needle and Utrata forceps were used to create a continuous tear capsulotomy.  Hydrodissection was performed using balanced salt solution on a fine cannula.  The lens nucleus was then removed using phacoemulsification in a quadrant cracking technique.  The cortical material was then removed with irrigation and aspiration.  The capsular bag and anterior chamber were refilled with Provisc.  The wound was widened to approximately 3 mm and a posterior chamber intraocular lens was placed into the capsular bag without difficulty using  an [employer] lens injecting system.  A single 10-0 nylon suture was then used to close the incision as well as stromal hydration.  The Provisc was removed from the anterior chamber and capsular bag with irrigation and aspiration.  At this point, the wounds were tested for leak, which were negative.  The anterior chamber remained deep and stable.  The patient tolerated the procedure well.  There were no operative complications, and he awoke from general anesthesia without problem.  No surgical specimens.  Prosthetic device used is a Alcon AcrySof posterior chamber lens model [id number], power of 19.5, serial number is [id number].          ______________________________ Susanne Greenhouse, MD     KEH/MEDQ  D:  10/21/2010  T:  10/21/2010  Job:  [id number]

## 2010-10-21 NOTE — Brief Op Note (Signed)
Pre-Op Dx: Cataract OS Post-Op Dx: Cataract OS Surgeon: Dayannara Pascal Anesthesia: Topical with MAC Implant: Lenstec, Model Softec HD Specimen: None Complications: None 

## 2010-10-21 NOTE — Transfer of Care (Signed)
Immediate Anesthesia Transfer of Care Note  Patient: [name]  Procedure(s) Performed:  CATARACT EXTRACTION PHACO AND INTRAOCULAR LENS PLACEMENT (IOC) - CDE:16.80  Patient Location: Shortstay  Anesthesia Type: MAC  Level of Consciousness: awake  Airway & Oxygen Therapy: Patient Spontanous Breathing   Post-op Assessment: Report given to PACU RN, Post -op Vital signs reviewed and stable and Patient moving all extremities  Post vital signs: Reviewed and stable  Complications: No apparent anesthesia complications

## 2010-10-21 NOTE — H&P (Signed)
I have evaluated the patient preoperatively, and have identified no interval changes in medical condition and plan of care since the history and physical of record 

## 2010-10-22 MED ORDER — ONDANSETRON HCL 4 MG/2ML IJ SOLN: 4.0000 mg | Freq: Once | INTRAMUSCULAR | Status: AC | PRN

## 2010-10-22 MED ORDER — FENTANYL CITRATE 0.05 MG/ML IJ SOLN: 25.0000 ug | INTRAMUSCULAR | Status: AC | PRN

## 2010-10-27 ENCOUNTER — Encounter (HOSPITAL_COMMUNITY): Payer: Self-pay | Admitting: Ophthalmology

## 2010-11-16 LAB — BASIC METABOLIC PANEL
BUN: 23
Chloride: 101
Creatinine, Ser: 1.08

## 2010-11-16 LAB — HEMOGLOBIN AND HEMATOCRIT, BLOOD: Hemoglobin: 14.8

## 2010-11-23 ENCOUNTER — Encounter: Payer: Medicare Other | Admitting: Thoracic Surgery (Cardiothoracic Vascular Surgery)

## 2010-11-29 DIAGNOSIS — D689 Coagulation defect, unspecified: Secondary | ICD-10-CM | POA: Insufficient documentation

## 2010-11-29 DIAGNOSIS — M1990 Unspecified osteoarthritis, unspecified site: Secondary | ICD-10-CM | POA: Insufficient documentation

## 2010-11-29 DIAGNOSIS — I719 Aortic aneurysm of unspecified site, without rupture: Secondary | ICD-10-CM | POA: Insufficient documentation

## 2010-11-29 DIAGNOSIS — R918 Other nonspecific abnormal finding of lung field: Secondary | ICD-10-CM | POA: Insufficient documentation

## 2010-11-29 DIAGNOSIS — I714 Abdominal aortic aneurysm, without rupture: Secondary | ICD-10-CM | POA: Insufficient documentation

## 2010-11-29 DIAGNOSIS — E785 Hyperlipidemia, unspecified: Secondary | ICD-10-CM | POA: Insufficient documentation

## 2010-11-30 ENCOUNTER — Ambulatory Visit
Admission: RE | Admit: 2010-11-30 | Discharge: 2010-11-30 | Disposition: A | Discharge status: Home / Self Care | Payer: Medicare Other | Source: Ambulatory Visit | Attending: Thoracic Surgery (Cardiothoracic Vascular Surgery) | Admitting: Thoracic Surgery (Cardiothoracic Vascular Surgery)

## 2010-11-30 ENCOUNTER — Encounter: Payer: Self-pay | Admitting: Thoracic Surgery (Cardiothoracic Vascular Surgery)

## 2010-11-30 ENCOUNTER — Other Ambulatory Visit: Payer: Self-pay | Admitting: Thoracic Surgery (Cardiothoracic Vascular Surgery)

## 2010-11-30 ENCOUNTER — Ambulatory Visit (INDEPENDENT_AMBULATORY_CARE_PROVIDER_SITE_OTHER): Payer: Medicare Other | Admitting: Thoracic Surgery (Cardiothoracic Vascular Surgery)

## 2010-11-30 VITALS — BP 110/74 | HR 76 | Resp 16 | Ht 69.0 in | Wt 266.0 lb

## 2010-11-30 DIAGNOSIS — I712 Thoracic aortic aneurysm, without rupture: Secondary | ICD-10-CM

## 2010-11-30 DIAGNOSIS — R918 Other nonspecific abnormal finding of lung field: Secondary | ICD-10-CM

## 2010-11-30 DIAGNOSIS — R911 Solitary pulmonary nodule: Secondary | ICD-10-CM

## 2010-11-30 MED ORDER — IOHEXOL 300 MG/ML  SOLN
100.0000 mL | Freq: Once | INTRAMUSCULAR | Status: AC | PRN
  Administered 2010-11-30: 100 mL via INTRAVENOUS

## 2010-11-30 NOTE — Progress Notes (Signed)
PCP is Cassell Smiles., MD Referring Provider is Cassell Smiles., MD  Chief Complaint  Patient presents with  . Follow-up    TAA and LUNG NODULES WITH CTA CHEST-6 MONTH    HPI: [name] returns today for a 6 month followup visit. He was seen in the office on March 14 of this year after a CT scan showed a 4.2 cm a sitting aorta and 2 small lung nodules 5 mm and 3 mm respectively. [name] states that about 2 weeks ago he developed cough, sore throat and congestion, that has now resolved. He denies any chest pain or shortness of breath. He has been exercising on a regular basis and has lost about 40 pounds.  Past Medical History  Diagnosis Date  . Diabetes mellitus     type II  . Obesity   . Osteoarthritis   . Venous insufficiency     lower extremities  . Cholecystitis   . AAA (abdominal aortic aneurysm)     followed by Dr. Tawanna Cooler Early  . Hyperlipidemia   . Clotting disorder     history of DVT  . Arthritis   . Lung nodules   . Aortic aneurysm without rupture     Past Surgical History  Procedure Date  . Cholecystectomy   . Inguinal hernia repair   . Testicle surgery 2004    Dr. Jerre Simon  . Cataract extraction w/phaco 10/21/2010    Procedure: CATARACT EXTRACTION PHACO AND INTRAOCULAR LENS PLACEMENT (IOC);  Surgeon: Gemma Payor;  Location: [facility];  Service: Ophthalmology;  Laterality: Left;  CDE:16.80    Family History  Problem Relation Age of Onset  . Heart disease Mother   . Cervical cancer Mother   . Stroke Father   . Diabetes Father   . Arthritis Sister   . Cancer Brother     bladder  . Cancer Sister     cervical  . Pancreatic cancer Other   . Prostate cancer Other   . Anesthesia problems Neg Hx   . Hypotension Neg Hx   . Malignant hyperthermia Neg Hx   . Pseudochol deficiency Neg Hx     Social History History  Substance Use Topics  . Smoking status: Former Smoker    Quit date: 07/24/1999  . Smokeless tobacco: Not on file  . Alcohol Use: No     Current Outpatient Prescriptions  Medication Sig Dispense Refill  . atorvastatin (LIPITOR) 20 MG tablet Take 20 mg by mouth at bedtime.        . furosemide (LASIX) 20 MG tablet Take 60 mg by mouth daily.       Marland Kitchen gabapentin (NEURONTIN) 100 MG tablet Take 100 mg by mouth 2 (two) times daily.       Marland Kitchen omeprazole (PRILOSEC) 20 MG capsule Take 20 mg by mouth daily.        . pentazocine-acetaminophen (TALACEN) 25-650 MG TABS Take 1 tablet by mouth as needed. For pain      . warfarin (COUMADIN) 5 MG tablet Take 5 mg by mouth daily. Takes a 7.5 mg and a 2 mg to make  9.5 mg      . HYDROcodone-acetaminophen (LORTAB) 10-500 MG per tablet        No current facility-administered medications for this visit.   Facility-Administered Medications Ordered in Other Visits  Medication Dose Route Frequency Provider Last Rate Last Dose  . fentaNYL (SUBLIMAZE) injection 25-50 mcg  25-50 mcg Intravenous Q5 min PRN Laurene Footman      .  iohexol (OMNIPAQUE) 300 MG/ML injection 100 mL  100 mL Intravenous Once PRN Medication Radiologist   100 mL at 11/30/10 1158    Allergies  Allergen Reactions  . Aspirin Other (See Comments)    Swells my prostate  . Claritin-D (Loratadine-Pseudoephedrine)   . Penicillins   . Sulfa Antibiotics     Review of Systems: 40 pound weight loss, intentional. Cough congestion and sore throat-resolving Denies chest pain or shortness of breath Persistent peripheral edema  BP 110/74  Pulse 76  Resp 16  Ht 5\' 9"  (1.753 m)  Wt 266 lb (120.657 kg)  BMI 39.28 kg/m2  SpO2 93% Physical Exam: Neurologic A. and O. x3, no focal deficits Neck no carotid bruit Cardiac regular rate and rhythm normal S1 and S2 no murmur Lungs clear Extremities 2-3+ edema bilaterally  Diagnostic Tests: CT of the chest shows no change in the ascending aorta approximately 4.1 x 4.2 cm, no change in lower lobe nodules, still measure 5 and 3 mm respectively. Parenchyma opacity right lower lobe question  pneumonia.  Impression: [name] is a 70 year old gentleman with a 4.2 cm ascending aortic aneurysm and 2 small lung nodules were found incidentally on CT was done after a fall. These have not changed in 6 months. The only new finding on his CT today is possible very small area of pneumonia in the right lower lobe. I recommended to Mr. and Mrs. Viner that we get a repeat CT scan in 6 months, to assess the aneurysm, the lung nodules, as well as his parenchymal opacity.  Plan: Return in 6 months CT angio of chest in 6 months

## 2011-04-20 ENCOUNTER — Other Ambulatory Visit: Payer: Self-pay | Admitting: Thoracic Surgery (Cardiothoracic Vascular Surgery)

## 2011-04-20 DIAGNOSIS — I712 Thoracic aortic aneurysm, without rupture: Secondary | ICD-10-CM

## 2011-04-20 DIAGNOSIS — D381 Neoplasm of uncertain behavior of trachea, bronchus and lung: Secondary | ICD-10-CM

## 2011-05-19 ENCOUNTER — Ambulatory Visit
Admission: RE | Admit: 2011-05-19 | Discharge: 2011-05-19 | Disposition: A | Discharge status: Home / Self Care | Payer: Medicare Other | Source: Ambulatory Visit | Attending: Thoracic Surgery (Cardiothoracic Vascular Surgery) | Admitting: Thoracic Surgery (Cardiothoracic Vascular Surgery)

## 2011-05-19 DIAGNOSIS — D381 Neoplasm of uncertain behavior of trachea, bronchus and lung: Secondary | ICD-10-CM

## 2011-05-19 DIAGNOSIS — I712 Thoracic aortic aneurysm, without rupture: Secondary | ICD-10-CM

## 2011-05-19 MED ORDER — IOHEXOL 350 MG/ML SOLN
100.0000 mL | Freq: Once | INTRAVENOUS | Status: AC | PRN
  Administered 2011-05-19: 100 mL via INTRAVENOUS

## 2011-05-26 ENCOUNTER — Ambulatory Visit (INDEPENDENT_AMBULATORY_CARE_PROVIDER_SITE_OTHER): Payer: Medicare Other | Admitting: Thoracic Surgery (Cardiothoracic Vascular Surgery)

## 2011-05-26 ENCOUNTER — Encounter: Payer: Self-pay | Admitting: Thoracic Surgery (Cardiothoracic Vascular Surgery)

## 2011-05-26 VITALS — BP 120/60 | HR 72 | Resp 18 | Ht 69.0 in | Wt 265.0 lb

## 2011-05-26 DIAGNOSIS — I712 Thoracic aortic aneurysm, without rupture: Secondary | ICD-10-CM

## 2011-05-26 NOTE — Progress Notes (Signed)
HPI:  [name] returns today for followup. He was found to have a 4.1 cm ascending aortic aneurysm and 2 small nodules in his right lower lobe CT of the chest done in August of 2010. He had followup scans done in February of 2011 and in October of 2011 which showed no change in the nodules or the aorta. He now returns for a scheduled followup CT chest.  In the interim he has not had any new medical issues. He feels well. He denies any chest pain or claudication type symptoms.  Past Medical History  Diagnosis Date  . Diabetes mellitus     type II  . Obesity   . Osteoarthritis   . Venous insufficiency     lower extremities  . Cholecystitis   . AAA (abdominal aortic aneurysm)     followed by Dr. Tawanna Cooler Early  . Hyperlipidemia   . Clotting disorder     history of DVT  . Arthritis   . Lung nodules   . Aortic aneurysm without rupture      Current Outpatient Prescriptions  Medication Sig Dispense Refill  . atorvastatin (LIPITOR) 20 MG tablet Take 20 mg by mouth at bedtime.        . furosemide (LASIX) 20 MG tablet Take 60 mg by mouth daily.       Marland Kitchen gabapentin (NEURONTIN) 100 MG tablet Take 100 mg by mouth 2 (two) times daily.       Marland Kitchen HYDROcodone-acetaminophen (LORTAB) 10-500 MG per tablet       . omeprazole (PRILOSEC) 20 MG capsule Take 20 mg by mouth daily.        Marland Kitchen warfarin (COUMADIN) 5 MG tablet Take 5 mg by mouth daily. Takes a 7.5 mg and a 2 mg to make  9.5 mg       No current facility-administered medications for this visit.   Facility-Administered Medications Ordered in Other Visits  Medication Dose Route Frequency Provider Last Rate Last Dose  . fentaNYL (SUBLIMAZE) injection 25-50 mcg  25-50 mcg Intravenous Q5 min PRN Laurene Footman, MD        Physical Exam BP 120/60  Pulse 72  Resp 18  Ht 5\' 9"  (1.753 m)  Wt 265 lb (120.203 kg)  BMI 39.13 kg/m2  SpO2 93% Gen. overweight but no acute distress Neuro alert and oriented x3 no focal deficits Cardiac regular rate and  rhythm, normal S1 and S2, no rubs or murmurs Lungs clear with equal breath sounds bilaterally Palpable pulses 2+ both upper and lower chimneys  Diagnostic Tests: *RADIOLOGY REPORT*  Clinical Data: Follow-up of aneurysmal disease of the ascending  thoracic aorta.  CT ANGIOGRAPHY CHEST  Technique: Multidetector CT imaging of the chest using the  standard protocol during bolus administration of intravenous  contrast. Multiplanar reconstructed images including MIPs were  obtained and reviewed to evaluate the vascular anatomy.  Contrast: 100 ml Omnipaque 350 IV  Comparison: 11/30/2010  Findings: There is stable mild aneurysmal dilatation of the  ascending thoracic aorta above the sinotubular junction with  maximal diameter of approximately 4.1 - 4.2 cm. There is no  evidence of aneurysmal disease at the level of the sinuses of  Valsalva. The aortic arch and descending thoracic aorta remain of  normal caliber. Proximal great vessels again demonstrate bovine  branching anatomy and normal patency. There is calcified  atherosclerotic plaque at the origin of the left subclavian artery  without subclavian stenosis.  Calcified atherosclerotic plaque is noted in the distribution of  the left anterior descending coronary artery. The heart size is  normal. No pleural or pericardial fluid identified. There is  atelectasis at the right lung base with associated elevation of the  right hemidiaphragm. This appears chronic when reviewing prior  studies.  Stable 4 mm nodules in both lower lobes which are stable since  2010. No enlarged lymph nodes identified.  IMPRESSION:  1. Stable mild aneurysmal dilatation of the ascending thoracic  aorta measuring 4.1 - 4.2 cm.  2. Stable benign appearing 4 mm bilateral lower lobe lung nodules.   Impression: 71 year old gentleman with mildly enlarged ascending aorta. This is been stable for almost 3 years now. I think at this point a safe to go do annual  followup. I also think that it would be best for long-term followup if we began following him with serial MR angios to avoid the ionizing radiation associated with repeated CT scans. As his lung nodules have remained stable and are less than 6 mm there is no need to continue to follow those.  Plan: Return in one year with an MR angio of the chest.

## 2012-05-04 ENCOUNTER — Other Ambulatory Visit: Payer: Self-pay

## 2012-05-04 DIAGNOSIS — I712 Thoracic aortic aneurysm, without rupture: Secondary | ICD-10-CM

## 2012-06-04 ENCOUNTER — Ambulatory Visit
Admission: RE | Admit: 2012-06-04 | Discharge: 2012-06-04 | Disposition: A | Discharge status: Home / Self Care | Payer: Medicare Other | Source: Ambulatory Visit | Attending: Thoracic Surgery (Cardiothoracic Vascular Surgery) | Admitting: Thoracic Surgery (Cardiothoracic Vascular Surgery)

## 2012-06-04 DIAGNOSIS — I712 Thoracic aortic aneurysm, without rupture: Secondary | ICD-10-CM

## 2012-06-04 MED ORDER — GADOBENATE DIMEGLUMINE 529 MG/ML IV SOLN
20.0000 mL | Freq: Once | INTRAVENOUS | Status: AC | PRN
  Administered 2012-06-04: 20 mL via INTRAVENOUS

## 2012-06-05 ENCOUNTER — Ambulatory Visit (INDEPENDENT_AMBULATORY_CARE_PROVIDER_SITE_OTHER): Payer: Medicare Other | Admitting: Thoracic Surgery (Cardiothoracic Vascular Surgery)

## 2012-06-05 VITALS — BP 135/78 | HR 80 | Resp 16 | Ht 69.0 in | Wt 275.0 lb

## 2012-06-05 DIAGNOSIS — I712 Thoracic aortic aneurysm, without rupture: Secondary | ICD-10-CM

## 2012-06-05 DIAGNOSIS — R918 Other nonspecific abnormal finding of lung field: Secondary | ICD-10-CM

## 2012-06-05 NOTE — Progress Notes (Signed)
HPI:  Mr. Centola returns for one year followup visit. He is a 72 year old gentleman who we have been following since 2010 for an enlarged ascending aorta. This is been stable at about 4.2 cm in diameter. He also had 2 small lung nodules one in each lower lobe proximally 4 mm in diameter. These have been stable over time as well therefore recommended that we do an MR angiogram on this occasion to just look at the aorta.  Mr. Tomey states that in the interim since his last visit he's been doing well. He's not had any new major medical problems. Has not been having any chest pain or shortness of breath. His weight has been stable.  Past Medical History  Diagnosis Date  . Diabetes mellitus     type II  . Obesity   . Osteoarthritis   . Venous insufficiency     lower extremities  . Cholecystitis   . AAA (abdominal aortic aneurysm)     followed by Dr. Tawanna Cooler Early  . Hyperlipidemia   . Clotting disorder     history of DVT  . Arthritis   . Lung nodules   . Aortic aneurysm without rupture      Current Outpatient Prescriptions  Medication Sig Dispense Refill  . atorvastatin (LIPITOR) 20 MG tablet Take 20 mg by mouth at bedtime.        . furosemide (LASIX) 20 MG tablet Take 60 mg by mouth daily.       Marland Kitchen gabapentin (NEURONTIN) 100 MG tablet Take 100 mg by mouth 2 (two) times daily.       Marland Kitchen HYDROcodone-acetaminophen (LORTAB) 10-500 MG per tablet       . omeprazole (PRILOSEC) 20 MG capsule Take 20 mg by mouth daily.        . predniSONE (DELTASONE) 20 MG tablet Take 20 mg by mouth daily. 2 TABS PRN      . testosterone enanthate (DELATESTRYL) 200 MG/ML injection Inject into the muscle every 14 (fourteen) days. For IM use only      . warfarin (COUMADIN) 5 MG tablet Take 5 mg by mouth daily. Takes a 7.5 mg and a 2 mg to make  9.5 mg       No current facility-administered medications for this visit.   Facility-Administered Medications Ordered in Other Visits  Medication Dose Route Frequency  Provider Last Rate Last Dose  . fentaNYL (SUBLIMAZE) injection 25-50 mcg  25-50 mcg Intravenous Q5 min PRN Laurene Footman, MD        Physical Exam BP 135/78  Pulse 80  Resp 16  Ht 5\' 9"  (1.753 m)  Wt 275 lb (124.739 kg)  BMI 40.59 kg/m2  SpO2 27% Obese 72 year old male in no acute distress Neurologic alert and oriented x3 Lungs equal but diminished breath sounds bilaterally Cardiac regular rate and rhythm, normal S1 and S2, 2/6 systolic murmur No carotid bruits  Diagnostic Tests: MR angiogram chest for 2114 *RADIOLOGY REPORT*  Clinical Data: Known ascending thoracic aortic aneurysm  MRA CHEST WITH OR WITHOUT CONTRAST  Technique: Angiographic images of the chest were obtained using MRA  technique without and with intravenous contrast.  Contrast: 20mL MULTIHANCE GADOBENATE DIMEGLUMINE 529 MG/ML IV SOLN  BUN and creatinine were obtained on site at [facility] at  [address]  Results: BUN 17 mg/dL, Creatinine 0.9 mg/dL.  Comparison: 05/19/2011  Findings: There is again noted aneurysmal dilatation of the  ascending thoracic aorta just above the sinotubular junction.  Maximum diameter is  approximately 4.3 cm and roughly stable from  prior exam. The proximal portions of the great vessels are within  normal limits. The origin of the left subclavian artery shows  normal flow void although signal dropout is noted due to heavy  calcification.  The visualized portions of the upper abdomen are within normal  limits. No significant hilar or mediastinal adenopathy is noted.  No other focal abnormality is seen.  IMPRESSION:  Stable ascending thoracic aortic aneurysm.   Impression: 72 year old male with a dilated ascending aorta. This has been stable since 2010. There is no indication for surgical intervention at this time.  His wife remains very concerned about the lung nodules. I reviewed with her that those had been stable over time. She is requesting that we do a CT  scan on his next visit take one more look at the lung nodules.  Plan: Return in one year with CT angiogram of the chest reassess ascending aortic aneurysm

## 2012-07-16 ENCOUNTER — Ambulatory Visit: Payer: Medicare Other | Admitting: Neurosurgery

## 2012-08-15 ENCOUNTER — Other Ambulatory Visit: Payer: Self-pay | Admitting: *Deleted

## 2012-08-15 DIAGNOSIS — I714 Abdominal aortic aneurysm, without rupture: Secondary | ICD-10-CM

## 2012-08-20 ENCOUNTER — Encounter: Payer: Self-pay | Admitting: Vascular Surgery

## 2012-08-21 ENCOUNTER — Ambulatory Visit (INDEPENDENT_AMBULATORY_CARE_PROVIDER_SITE_OTHER): Payer: Medicare Other | Admitting: Vascular Surgery

## 2012-08-21 ENCOUNTER — Encounter (INDEPENDENT_AMBULATORY_CARE_PROVIDER_SITE_OTHER): Payer: Medicare Other | Admitting: *Deleted

## 2012-08-21 ENCOUNTER — Encounter: Payer: Self-pay | Admitting: Vascular Surgery

## 2012-08-21 VITALS — BP 143/84 | HR 63 | Resp 16 | Ht 70.0 in | Wt 289.0 lb

## 2012-08-21 DIAGNOSIS — I714 Abdominal aortic aneurysm, without rupture: Secondary | ICD-10-CM

## 2012-08-21 NOTE — Progress Notes (Signed)
The patient presents today for followup of his small abdominal aortic aneurysm. This is been followed since 2007 has been in the 3 cm range. He does have morbid obesity and is always a difficult study with ultrasound. Last ultrasound was 2 years ago. He is on chronic Coumadin therapy for recurrent DVT. He has no new major medical difficulties and certainly no symptoms from a small aneurysm  Past Medical History  Diagnosis Date  . Diabetes mellitus     type II  . Obesity   . Osteoarthritis   . Venous insufficiency     lower extremities  . Cholecystitis   . AAA (abdominal aortic aneurysm)     followed by Dr. Tawanna Cooler Hania Cerone  . Hyperlipidemia   . Clotting disorder     history of DVT  . Arthritis   . Lung nodules   . Aortic aneurysm without rupture     History  Substance Use Topics  . Smoking status: Former Smoker    Types: Cigarettes    Quit date: 07/24/1999  . Smokeless tobacco: Never Used  . Alcohol Use: No    Family History  Problem Relation Age of Onset  . Heart disease Mother   . Cervical cancer Mother   . Stroke Father   . Diabetes Father   . Arthritis Sister   . Cancer Brother     bladder  . Cancer Sister     cervical  . Pancreatic cancer Other   . Prostate cancer Other   . Anesthesia problems Neg Hx   . Hypotension Neg Hx   . Malignant hyperthermia Neg Hx   . Pseudochol deficiency Neg Hx     Allergies  Allergen Reactions  . Aspirin Other (See Comments)    Swells my prostate  . Claritin-D (Loratadine-Pseudoephedrine Er)   . Penicillins   . Sulfa Antibiotics     Current outpatient prescriptions:atorvastatin (LIPITOR) 20 MG tablet, Take 20 mg by mouth at bedtime.  , Disp: , Rfl: ;  furosemide (LASIX) 20 MG tablet, Take 60 mg by mouth daily. , Disp: , Rfl: ;  gabapentin (NEURONTIN) 100 MG tablet, Take 100 mg by mouth 2 (two) times daily. , Disp: , Rfl: ;  HYDROcodone-acetaminophen (LORTAB) 10-500 MG per tablet, , Disp: , Rfl: ;  omeprazole (PRILOSEC) 20 MG  capsule, Take 20 mg by mouth daily.  , Disp: , Rfl:  predniSONE (DELTASONE) 20 MG tablet, Take 20 mg by mouth daily. 2 TABS PRN, Disp: , Rfl: ;  testosterone enanthate (DELATESTRYL) 200 MG/ML injection, Inject into the muscle every 14 (fourteen) days. For IM use only, Disp: , Rfl: ;  warfarin (COUMADIN) 5 MG tablet, Take 5 mg by mouth daily. Takes a 7.5 mg and a 2 mg to make  9.5 mg, Disp: , Rfl:  No current facility-administered medications for this visit. Facility-Administered Medications Ordered in Other Visits: fentaNYL (SUBLIMAZE) injection 25-50 mcg, 25-50 mcg, Intravenous, Q5 min PRN, Laurene Footman, MD  BP 143/84  Pulse 63  Resp 16  Ht 5\' 10"  (1.778 m)  Wt 289 lb (131.09 kg)  BMI 41.47 kg/m2  Body mass index is 41.47 kg/(m^2).       Physical exam: Well developed well-nourished gentleman in no acute distress with obesity Abdomen soft and nontender no masses noted no aneurysm palpable Pulse status 2+ radial 2+ popliteal and 2+ dorsalis pedis pulses with no evidence of peripheral artery aneurysm Respirations equal and nonlabored Carotid arteries without bruits bilaterally Heart regular rate and rhythm  Ultrasound  today again had some technical difficulty due to the size. This does appear to have a maximal size of approximately 3 cm.  Impression and plan small infrarenal abdominal aortic aneurysm. I have explained that since we have been following this for approximately 8 years with no change after comfortable looking at it in 3 years with CT scan to rule out enlargement.

## 2012-08-21 NOTE — Addendum Note (Signed)
Addended by: Sharee Pimple on: 08/21/2012 11:21 AM   Modules accepted: Orders

## 2013-01-24 ENCOUNTER — Encounter (HOSPITAL_COMMUNITY)
Admission: RE | Admit: 2013-01-24 | Discharge: 2013-01-24 | Disposition: A | Discharge status: Home / Self Care | Payer: Medicare Other | Source: Ambulatory Visit | Attending: "Endocrinology | Admitting: "Endocrinology

## 2013-01-24 DIAGNOSIS — E348 Other specified endocrine disorders: Secondary | ICD-10-CM | POA: Insufficient documentation

## 2013-01-24 MED ORDER — SODIUM CHLORIDE 0.9 % IJ SOLN: 10.0000 mL | INTRAMUSCULAR | Status: DC | PRN

## 2013-01-24 MED ORDER — SODIUM CHLORIDE 0.9 % IJ SOLN
10.0000 mL | INTRAMUSCULAR | Status: AC | PRN
  Administered 2013-01-24: 10 mL

## 2013-01-24 MED ORDER — COSYNTROPIN 0.25 MG IJ SOLR
INTRAMUSCULAR | Status: AC
  Filled 2013-01-24: qty 0.25

## 2013-01-24 MED ORDER — COSYNTROPIN 0.25 MG IJ SOLR
0.2500 mg | Freq: Once | INTRAMUSCULAR | Status: AC
  Administered 2013-01-24: 0.25 mg via INTRAVENOUS

## 2013-01-25 LAB — ACTH STIMULATION, 3 TIME POINTS
Cortisol, 30 Min: 14.4 ug/dL — ABNORMAL LOW (ref >20.0)
Cortisol, Base: 8.9 ug/dL

## 2013-01-25 NOTE — Progress Notes (Signed)
Results for [name], [name] (MRN [id number]) as of 01/25/2013 08:56  Ref. Range 01/24/2013 09:00  Cortisol, Base No range found 8.9  Cortisol, 30 Min Latest Range: >20.0 ug/dL 40.9 (L)  Cortisol, 60 Min Latest Range: >20 ug/dL 81.1 (L)  ACTH stimulation test tolerated well

## 2013-04-30 ENCOUNTER — Other Ambulatory Visit: Payer: Self-pay

## 2013-04-30 DIAGNOSIS — D381 Neoplasm of uncertain behavior of trachea, bronchus and lung: Secondary | ICD-10-CM

## 2013-04-30 DIAGNOSIS — I712 Thoracic aortic aneurysm, without rupture, unspecified: Secondary | ICD-10-CM

## 2013-05-01 ENCOUNTER — Other Ambulatory Visit: Payer: Self-pay

## 2013-05-01 DIAGNOSIS — I712 Thoracic aortic aneurysm, without rupture, unspecified: Secondary | ICD-10-CM

## 2013-05-01 DIAGNOSIS — D381 Neoplasm of uncertain behavior of trachea, bronchus and lung: Secondary | ICD-10-CM

## 2013-06-07 ENCOUNTER — Other Ambulatory Visit: Payer: Self-pay | Admitting: Thoracic Surgery (Cardiothoracic Vascular Surgery)

## 2013-06-07 LAB — BUN: BUN: 15 mg/dL (ref 6–23)

## 2013-06-07 LAB — CREATININE, SERUM: Creat: 0.91 mg/dL (ref 0.50–1.35)

## 2013-06-11 ENCOUNTER — Ambulatory Visit (INDEPENDENT_AMBULATORY_CARE_PROVIDER_SITE_OTHER): Payer: Medicare Other | Admitting: Thoracic Surgery (Cardiothoracic Vascular Surgery)

## 2013-06-11 ENCOUNTER — Ambulatory Visit
Admission: RE | Admit: 2013-06-11 | Discharge: 2013-06-11 | Disposition: A | Discharge status: Home / Self Care | Payer: Medicare Other | Source: Ambulatory Visit | Attending: Thoracic Surgery (Cardiothoracic Vascular Surgery) | Admitting: Thoracic Surgery (Cardiothoracic Vascular Surgery)

## 2013-06-11 ENCOUNTER — Encounter: Payer: Self-pay | Admitting: Thoracic Surgery (Cardiothoracic Vascular Surgery)

## 2013-06-11 VITALS — BP 148/83 | HR 77 | Resp 20 | Ht 70.0 in | Wt 296.0 lb

## 2013-06-11 DIAGNOSIS — I712 Thoracic aortic aneurysm, without rupture, unspecified: Secondary | ICD-10-CM

## 2013-06-11 DIAGNOSIS — R918 Other nonspecific abnormal finding of lung field: Secondary | ICD-10-CM

## 2013-06-11 DIAGNOSIS — D381 Neoplasm of uncertain behavior of trachea, bronchus and lung: Secondary | ICD-10-CM

## 2013-06-11 MED ORDER — IOHEXOL 350 MG/ML SOLN
80.0000 mL | Freq: Once | INTRAVENOUS | Status: AC | PRN
  Administered 2013-06-11: 80 mL via INTRAVENOUS

## 2013-06-11 NOTE — Progress Notes (Signed)
HPI:  Mr. Ryner returns for followup of his ascending aortic aneurysm.  I first saw Mr. Fly in March of 2012. At that time he had a 4.1 cm of ascending aortic aneurysm and 2 small (4 mm) lower lobe lung nodules. We followed him at six-month intervals. There is no significant change in the aneurysm and the lung nodules also remained unchanged. His most recent office visit was a year ago at which time he was doing well and MRA showed that the aneurysm was unchanged.  He said he's been feeling poorly recently. He has recently been diagnosed with adrenal insufficiency and started on prednisone. He does still low better since the prednisone was started. He has not had any chest pain or shortness of breath. He now is taking Dilaudid for his arthritic pain.  Past Medical History  Diagnosis Date  . Diabetes mellitus     type II  . Obesity   . Osteoarthritis   . Venous insufficiency     lower extremities  . Cholecystitis   . AAA (abdominal aortic aneurysm)     followed by Dr. Sherren Mocha Early  . Hyperlipidemia   . Clotting disorder     history of DVT  . Arthritis   . Lung nodules   . Aortic aneurysm without rupture       Current Outpatient Prescriptions  Medication Sig Dispense Refill  . atorvastatin (LIPITOR) 20 MG tablet Take 20 mg by mouth at bedtime.        . furosemide (LASIX) 20 MG tablet Take 60 mg by mouth daily.       Marland Kitchen gabapentin (NEURONTIN) 100 MG tablet Take 100 mg by mouth 2 (two) times daily.       Marland Kitchen HYDROmorphone (DILAUDID) 4 MG tablet Take 4 mg by mouth every 6 (six) hours as needed.       Marland Kitchen omeprazole (PRILOSEC) 20 MG capsule Take 20 mg by mouth daily.        . predniSONE (DELTASONE) 20 MG tablet Take 5 mg by mouth daily with breakfast. 2 TABS PRN      . testosterone enanthate (DELATESTRYL) 200 MG/ML injection Inject into the muscle every 14 (fourteen) days. For IM use only      . warfarin (COUMADIN) 5 MG tablet Take 5 mg by mouth daily. Takes a 7.5 mg and a 2 mg to  make  9.5 mg       No current facility-administered medications for this visit.   Facility-Administered Medications Ordered in Other Visits  Medication Dose Route Frequency Provider Last Rate Last Dose  . fentaNYL (SUBLIMAZE) injection 25-50 mcg  25-50 mcg Intravenous Q5 min PRN Lerry Liner, MD        Physical Exam BP 148/83  Pulse 77  Resp 20  Ht 5\' 10"  (1.778 m)  Wt 296 lb (134.265 kg)  BMI 42.47 kg/m2  SpO44 12% 73 year old man in no acute distress Obese Alert and oriented x3 with no focal deficits No carotid bruits, no cervical or supraclavicular adenopathy Cardiac regular rate and rhythm normal S1 and S2 no rubs or murmurs Lungs clear with equal breath sounds bilaterally  Diagnostic Tests: CT of chest 06/11/2013 FINDINGS:  There is stable mild aneurysmal dilatation of the ascending thoracic  aorta, measuring 4.1 cm in greatest diameter. No associated  dissection. There is no involvement of the sinuses of Valsalva. The  proximal arch measures 3.7 cm and the distal arch 2.9 cm. The  descending thoracic aorta measures 2.7 cm. Proximal great  vessels  show stable bovine branching pattern normal patency.  Stable calcified plaque is identified in the LAD and also  potentially is small focal calcified plaque in the left main  coronary artery. The heart size is normal. No pleural or pericardial  fluid is seen.  There is stable elevation right hemidiaphragm with associated volume  loss of the right lung. Lungs show no evidence of edema,  consolidation or nodule. No enlarged lymph nodes are seen.  Visualized upper abdominal structures are unremarkable. Stable  degenerative changes are present of the spine.  Review of the MIP images confirms the above findings.  IMPRESSION:  Stable mild aneurysmal dilatation of the ascending thoracic aorta  measuring 4.1 cm in greatest diameter. Stable calcified coronary  artery plaque.  Electronically Signed  By: Aletta Edouard M.D.  On:  06/11/2013 11:19  Impression: Mr. Robarts is a 73 year old gentleman who is followed for an ascending aortic aneurysm as well as bilateral lung nodules. His aneurysm has not changed significantly in size and can continue to be followed. On the other hand it does appear that the nodule in the left lower lobe may have increased in size in comparison to a CT scan from 2013. This is a relatively small increase over a rather long period of time, but I do think given that it has increased we should repeat a CT in about 6 months.  His wife asked about going ahead and taking the nodule at this time. In taking into consideration the very slow growth rate and still small size of the nodule and his comorbidities, I do not think that is a good idea. At this point his operative risk would outweigh the risk from following the nodule. Certainly if the nodule continues to grow then that could change the equation.  Plan: Return in 6 months with a noncontrast CT of chest to followup left lower lobe nodule

## 2013-06-20 ENCOUNTER — Encounter: Payer: Self-pay | Admitting: *Deleted

## 2013-06-21 ENCOUNTER — Institutional Professional Consult (permissible substitution): Payer: Self-pay | Admitting: Neurology

## 2013-06-28 ENCOUNTER — Institutional Professional Consult (permissible substitution): Payer: Self-pay | Admitting: Neurology

## 2013-07-01 ENCOUNTER — Ambulatory Visit (INDEPENDENT_AMBULATORY_CARE_PROVIDER_SITE_OTHER): Payer: Medicare Other | Admitting: Neurology

## 2013-07-01 ENCOUNTER — Encounter: Payer: Self-pay | Admitting: Neurology

## 2013-07-01 VITALS — BP 140/88 | HR 74 | Resp 18 | Ht 68.5 in | Wt 296.0 lb

## 2013-07-01 DIAGNOSIS — E291 Testicular hypofunction: Secondary | ICD-10-CM

## 2013-07-01 DIAGNOSIS — R0902 Hypoxemia: Secondary | ICD-10-CM | POA: Insufficient documentation

## 2013-07-01 DIAGNOSIS — E271 Primary adrenocortical insufficiency: Secondary | ICD-10-CM | POA: Insufficient documentation

## 2013-07-01 DIAGNOSIS — R0683 Snoring: Secondary | ICD-10-CM | POA: Insufficient documentation

## 2013-07-01 DIAGNOSIS — R0609 Other forms of dyspnea: Secondary | ICD-10-CM

## 2013-07-01 DIAGNOSIS — E2749 Other adrenocortical insufficiency: Secondary | ICD-10-CM

## 2013-07-01 DIAGNOSIS — R0989 Other specified symptoms and signs involving the circulatory and respiratory systems: Secondary | ICD-10-CM

## 2013-07-01 DIAGNOSIS — R61 Generalized hyperhidrosis: Secondary | ICD-10-CM

## 2013-07-01 NOTE — Patient Instructions (Signed)

## 2013-07-01 NOTE — Progress Notes (Addendum)
[facility]  Provider:  Larey Seat, M D  Referring Provider: Redmond School, MD Primary Care Physician:  Glo Herring., MD  Chief Complaint  Patient presents with  . New Evaluation    Room 11  . Sleep consult    HPI:  [name] is a 73 y.o. married , right  handed  male , who is seen here as a referral  from Dr. Gerarda Fraction for a sleep evaluation;   [name] presents today after a pulse oximetry performed on room air on 06-08-13 revealed severe nocturnal hypoxemia. He also had tachybradycardia arrhythmia the highest pulse rate or 100 the lowest at 28 beats per minute. The over all baseline was only 81.5. At or below 89% saturations were 7 hours and 59 minutes. Also there is a high risk of artifact in the nocturnal pulse oximetry the tachybradycardia arrhythmia indicates that he truly may half apnea this hypoxemia on top.  Complicating is a history of pulmonary nodules that have been followed every 6 months by his primary care physician and since all for her to have been stationary. He also has gained weight over the last 5 years partially due to being treated for arthritis with prednisone. 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 He does not have nocturia, saw the 6 periods of severe hypoxemia as documented in his pulse oximetry report unlikely REM-dependent hypoxemia.  He was just recently started on prednisone again and he is taking testosterone supplements. He is also on Coumadin for the treatment and prevention of DVTs.  He is on Lipitor, Prilosec.  He has hypertension which appears to be controlled he has chronic pain which Dr.FUSCO  has treated and he has a narcotic contract with his provider.  The patient is a former smoker, he  quit 14 years ago and he does not drink alcohol.  he has no true bed time, sleeps on and off all day and night. He doesn't have a rise time, either. It can be  3 AM to 8 AM.  He  Is orthopneic. When in bed, he sleeps on 3 pillows.  Dr. Gerarda Fraction already ordered  Oxygen at 2 liters at night .  [name] goes to sleep in a recliner,at 5 pm, often all afternoon and into the night. His sleep is fragmented into 90 minute blocks, due to pain . His pain medication makes him sleepy, he cannot exercise and gained over 35 pounds. He snores loudly in the recliner, and his wife witnessed severe apneas. He doesn't kick a lot at night. He breathes shallow during the day. He uses narcotics just at bedtime, but doesn't Use his gabapentin more than twice a day ( prescribed as TID).  No parasomnia activity, no dreams reported. No family history of OSA .    Review of Systems: Out of a complete 14 system review, the patient complains of only the following symptoms, and all other reviewed systems are negative. Patient endorsed the geriatric depression score at points at Epworth sleepiness score at 12 points, and fatigue severity score at 48 points.       History   Social History  . Marital Status: Married    Spouse Name: [name]    Number of Children: 2  . Years of Education: 12   Occupational History  .     Social History Main Topics  . Smoking status: Former Smoker    Types: Cigarettes    Quit date: 07/24/1999  .  Smokeless tobacco: Never Used  . Alcohol Use: No  . Drug Use: No  . Sexual Activity: Not on file   Other Topics Concern  . Not on file   Social History Narrative   Patient is married Administrator, [employer]) and lives at home with his wife.   Patient has two children.   Patient is retired.   Patient has a high school education.   Patient is right-handed.   Patient drinks 4 cups of coffee and 2 cups of diet soda daily./    Family History  Problem Relation Age of Onset  . Heart disease Mother   . Cervical cancer Mother   . Stroke Father   . Diabetes Father   . Arthritis Sister   . Cancer Brother     bladder  . Cancer Sister     cervical  . Pancreatic cancer  Other   . Prostate cancer Other   . Anesthesia problems Neg Hx   . Hypotension Neg Hx   . Malignant hyperthermia Neg Hx   . Pseudochol deficiency Neg Hx     Past Medical History  Diagnosis Date  . Diabetes mellitus     type II  . Obesity   . Osteoarthritis   . Venous insufficiency     lower extremities  . Cholecystitis   . AAA (abdominal aortic aneurysm)     followed by Dr. Sherren Mocha Early  . Hyperlipidemia   . Clotting disorder     history of DVT  . Arthritis   . Lung nodules   . Aortic aneurysm without rupture   . Hypertension     Past Surgical History  Procedure Laterality Date  . Cholecystectomy    . Inguinal hernia repair    . Testicle surgery  2004    Dr. [name]  . Cataract extraction w/phaco  10/21/2010    Procedure: CATARACT EXTRACTION PHACO AND INTRAOCULAR LENS PLACEMENT (IOC);  Surgeon: Tonny Branch;  Location: [facility];  Service: Ophthalmology;  Laterality: Left;  CDE:16.80    Current Outpatient Prescriptions  Medication Sig Dispense Refill  . atorvastatin (LIPITOR) 20 MG tablet Take 20 mg by mouth at bedtime.        . furosemide (LASIX) 20 MG tablet Take 60 mg by mouth daily.       Marland Kitchen gabapentin (NEURONTIN) 100 MG tablet Take 100 mg by mouth 2 (two) times daily.       Marland Kitchen HYDROmorphone (DILAUDID) 4 MG tablet Take 4 mg by mouth every 6 (six) hours as needed.       Marland Kitchen omeprazole (PRILOSEC) 20 MG capsule Take 20 mg by mouth daily.        . predniSONE (DELTASONE) 20 MG tablet Take 5 mg by mouth daily with breakfast. 2 TABS PRN      . testosterone enanthate (DELATESTRYL) 200 MG/ML injection Inject into the muscle every 14 (fourteen) days. For IM use only      . warfarin (COUMADIN) 5 MG tablet Take 5 mg by mouth daily. Takes a 7.5 mg and a 2 mg to make  9.5 mg       No current facility-administered medications for this visit.   Facility-Administered Medications Ordered in Other Visits  Medication Dose Route Frequency Provider Last Rate Last Dose  . fentaNYL (SUBLIMAZE)  injection 25-50 mcg  25-50 mcg Intravenous Q5 min PRN Lerry Liner, MD        Allergies as of 07/01/2013 - Review Complete 07/01/2013  Allergen Reaction Noted  . Aspirin Other (See  Comments) 10/14/2010  . Claritin-d [loratadine-pseudoephedrine er]  05/28/2010  . Penicillins  03/26/2010  . Sulfa antibiotics  03/26/2010    Vitals: BP 140/88  Pulse 74  Resp 18  Ht 5' 8.5" (1.74 m)  Wt 296 lb (134.265 kg)  BMI 44.35 kg/m2 Last Weight:  Wt Readings from Last 1 Encounters:  07/01/13 296 lb (134.265 kg)   Last Height:   Ht Readings from Last 1 Encounters:  07/01/13 5' 8.5" (1.74 m)    Physical exam:  General: The patient is awake, alert and appears not in acute distress. The patient is well groomed. Head: Normocephalic, atraumatic. Neck is supple. Mallampati 4 , neck circumference:20, no TMJ ,nasal passage unobstructed.  Cardiovascular:  Regular rate and rhythm, with  murmurs or carotid bruit, and without distended neck veins. Respiratory: Lungs are clear to auscultation. Apex/ excavatum. Barrel chest. Skin:   evidence of  Ankle edema, petechia , varicose veins. He wears  Compression stockings.  Trunk: BMI is  elevated and patient  has developed a hunched posture,  Neurologic exam : The patient is awake and alert, oriented to place and time.  Memory subjective described as intact.  There is a normal attention span & concentration ability. Speech is fluent without  dysarthria, dysphonia or aphasia.  Mood and affect are appropriate, he is a little testy when his wife interrupts him.  Cranial nerves: Pupils are equal and briskly reactive to light. Funduscopic exam without  evidence of pallor or edema.  Extraocular movements  in vertical and horizontal planes intact and without nystagmus. Visual fields by finger perimetry are intact. Hearing to finger rub intact.  Facial sensation intact to fine touch. Facial motor strength is symmetric and tongue and uvula move midline.  Motor  exam:   Normal tone , reduced  muscle bulk and symmetric  strength in all extremities.  Sensory:  Fine touch, pinprick and vibration were tested in all extremities. Proprioception is tested in the upper extremities only. This was normal.  Coordination: Rapid alternating movements in the fingers/hands is tested and normal.  -to-nose maneuver tested and normal without evidence of ataxia, dysmetria or tremor.  Gait and station: Patient walks without assistive device .Stance is stable when wide based .  Tandem gait is fragmented. Romberg testing is normal.  Deep tendon reflexes: in the  upper and lower extremities are  attenuated, symmetric . Babinski maneuver response is downgoing.   Assessment:  After physical and neurologic examination, review of laboratory studies, imaging, neurophysiology testing and pre-existing records, assessment is   1)  Hypoventilation syndrome with hypoxemia.  2) Possible central apnea overlay from narcotics, and OSA from obesity . He has an underlying pulmonary disease.  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 Hypersomnia . Wife reports personality changes, irritability and impulse control problems - likely related to Narcotics.   4) depression.   Plan:  Treatment plan and additional workup : SPLIT at AHI 15 , score at 4%  Oxygen titration as possible, patient uses 2 liters at home, we don't have a ONO on 02 yet. Weight loss discussed, CPAP explained. 60 minute visit . He needs to take meds as prescribed. He will need CO2 monitoring . Consider using gabapentin at 600 mg at night in exchange for narcotic meds.

## 2013-07-02 ENCOUNTER — Ambulatory Visit (INDEPENDENT_AMBULATORY_CARE_PROVIDER_SITE_OTHER): Payer: Medicare Other | Admitting: Neurology

## 2013-07-02 DIAGNOSIS — R0902 Hypoxemia: Secondary | ICD-10-CM

## 2013-07-02 DIAGNOSIS — R61 Generalized hyperhidrosis: Secondary | ICD-10-CM

## 2013-07-02 DIAGNOSIS — E271 Primary adrenocortical insufficiency: Secondary | ICD-10-CM

## 2013-07-02 DIAGNOSIS — R0683 Snoring: Secondary | ICD-10-CM

## 2013-07-02 DIAGNOSIS — E291 Testicular hypofunction: Secondary | ICD-10-CM

## 2013-07-02 DIAGNOSIS — G4733 Obstructive sleep apnea (adult) (pediatric): Secondary | ICD-10-CM

## 2013-07-11 ENCOUNTER — Encounter: Payer: Self-pay | Admitting: *Deleted

## 2013-07-11 ENCOUNTER — Telehealth: Payer: Self-pay | Admitting: *Deleted

## 2013-07-11 ENCOUNTER — Other Ambulatory Visit: Payer: Self-pay | Admitting: *Deleted

## 2013-07-11 DIAGNOSIS — E662 Morbid (severe) obesity with alveolar hypoventilation: Secondary | ICD-10-CM

## 2013-07-11 DIAGNOSIS — G4733 Obstructive sleep apnea (adult) (pediatric): Secondary | ICD-10-CM

## 2013-07-11 DIAGNOSIS — R0902 Hypoxemia: Secondary | ICD-10-CM

## 2013-07-11 NOTE — Telephone Encounter (Signed)
No answer, tried twice.  Tried mobile number under wife's name and it is disconnected.  Called and found out that patient has oxygen through [facility], will send CPAP orders here.  A copy of the sleep study interpretive report as well as a letter with info regarding contact info for the DME company, the importance of CPAP compliance, and the date of the follow up appointment info will be mailed to the patient's home.

## 2013-07-15 ENCOUNTER — Telehealth: Payer: Self-pay | Admitting: Neurology

## 2013-07-15 NOTE — Telephone Encounter (Signed)
Patient's spouse called wanting to know her husband's sleep study results. I informed the patient's spouse that the study reveal obstructive sleep apnea with CO2 retention. Dr. Brett Fairy recommend CPAP therapy at home with oxygen. I informed the patient's spouse that the order has been sent to [facility]. I also informed the patient's spouse that Jeanene Erb tried calling the home number twice, but there was no answer and tried calling the cell phone which was disconnected. Patient's spouse that she seen where [facility] called and she called back last week and no one called her back. I informed the patient's spouse that the results went out on 07-11-2013 with a follow up instruction letter.

## 2013-11-07 ENCOUNTER — Other Ambulatory Visit: Payer: Self-pay | Admitting: *Deleted

## 2013-11-07 DIAGNOSIS — R918 Other nonspecific abnormal finding of lung field: Secondary | ICD-10-CM

## 2013-12-03 ENCOUNTER — Ambulatory Visit
Admission: RE | Admit: 2013-12-03 | Discharge: 2013-12-03 | Disposition: A | Discharge status: Home / Self Care | Payer: Medicare Other | Source: Ambulatory Visit | Attending: Thoracic Surgery (Cardiothoracic Vascular Surgery) | Admitting: Thoracic Surgery (Cardiothoracic Vascular Surgery)

## 2013-12-03 ENCOUNTER — Encounter: Payer: Self-pay | Admitting: Thoracic Surgery (Cardiothoracic Vascular Surgery)

## 2013-12-03 ENCOUNTER — Ambulatory Visit (INDEPENDENT_AMBULATORY_CARE_PROVIDER_SITE_OTHER): Payer: Medicare Other | Admitting: Thoracic Surgery (Cardiothoracic Vascular Surgery)

## 2013-12-03 VITALS — BP 119/81 | HR 91 | Ht 68.0 in | Wt 296.0 lb

## 2013-12-03 DIAGNOSIS — I77810 Thoracic aortic ectasia: Secondary | ICD-10-CM

## 2013-12-03 DIAGNOSIS — R918 Other nonspecific abnormal finding of lung field: Secondary | ICD-10-CM

## 2013-12-03 DIAGNOSIS — I712 Thoracic aortic aneurysm, without rupture: Secondary | ICD-10-CM

## 2013-12-03 NOTE — Progress Notes (Signed)
HPI:  Mr. Horrigan returns for a scheduled 6 month followup visit.  He is a 73 year old man with a 4.1 cm ascending aortic aneurysm and small (4-5 mm) bilateral lower lobe lung nodules. I have been following him for the aneurysm for several years and it has not changed. On his last visit it appeared that the nodule in the left lower lobe may have increased in size slightly so we recommended a 6 month interim scan.  He says his been doing well. He is still on prednisone for adrenal insufficiency. He does have some problems with arthritis. He has not had any respiratory issues, nor chest pain.  Past Medical History  Diagnosis Date  . Diabetes mellitus     type II  . Obesity   . Osteoarthritis   . Venous insufficiency     lower extremities  . Cholecystitis   . AAA (abdominal aortic aneurysm)     followed by Dr. Sherren Mocha Early  . Hyperlipidemia   . Clotting disorder     history of DVT  . Arthritis   . Lung nodules   . Aortic aneurysm without rupture   . Hypertension       Current Outpatient Prescriptions  Medication Sig Dispense Refill  . atorvastatin (LIPITOR) 20 MG tablet Take 20 mg by mouth at bedtime.        . furosemide (LASIX) 20 MG tablet Take 60 mg by mouth daily.       Marland Kitchen gabapentin (NEURONTIN) 100 MG tablet Take 100 mg by mouth 2 (two) times daily.       Marland Kitchen HYDROmorphone (DILAUDID) 4 MG tablet Take 4 mg by mouth every 6 (six) hours as needed.       Marland Kitchen omeprazole (PRILOSEC) 20 MG capsule Take 20 mg by mouth daily.        . predniSONE (DELTASONE) 20 MG tablet Take 5 mg by mouth daily with breakfast. 2 TABS PRN      . testosterone enanthate (DELATESTRYL) 200 MG/ML injection Inject into the muscle every 14 (fourteen) days. For IM use only      . warfarin (COUMADIN) 5 MG tablet Take 5 mg by mouth daily. Takes a 7.5 mg and a 2 mg to make  9.5 mg      . ciprofloxacin (CIPRO) 500 MG tablet        No current facility-administered medications for this visit.   Facility-Administered  Medications Ordered in Other Visits  Medication Dose Route Frequency Provider Last Rate Last Dose  . fentaNYL (SUBLIMAZE) injection 25-50 mcg  25-50 mcg Intravenous Q5 min PRN Lerry Liner, MD        Physical Exam BP 119/81  Pulse 91  Ht 5\' 8"  (1.727 m)  Wt 296 lb (134.265 kg)  BMI 45.02 kg/m2  SpO2 7% Obese 73 year old man in no acute distress Alert oriented x3 with no focal neurologic deficit Cardiac regular rate and rhythm normal S1-S2 Lungs clear with equal size bilaterally  Diagnostic Tests: CT CHEST WITHOUT CONTRAST  TECHNIQUE:  Multidetector CT imaging of the chest was performed following the  standard protocol without IV contrast.  COMPARISON: 06/11/2013. 05/19/2011.  FINDINGS:  5 mm left lower lobe pulmonary nodule on image 27, stable since most  recent study, not significantly changed when compared to 2013 study  when this measured 4 mm. 4 mm nodule in the right lower lobe on  image 27 is stable dating back to 2013. Node new pulmonary nodules.  No pleural effusions.  Heart is upper  limits normal in size. Coronary artery and aortic  calcifications present. 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 No mediastinal, hilar, or axillary adenopathy. Chest wall  soft tissues are unremarkable. Imaging into the upper abdomen shows  no acute findings.  No acute bony abnormality or focal bone lesion.  IMPRESSION:  Stable bilateral lower lobe pulmonary nodules, dating back to 2013,  compatible with benign nodules.  Stable slight aneurysmal dilatation of the ascending thoracic aorta,  4.1 cm.  Coronary artery disease.  Electronically Signed  By: Rolm Baptise M.D.  On: 12/03/2013 11:49   Impression: 73 year old man with a 4.1 cm ascending aorta and bilateral lower lobe lung nodules. These findings are all stable.  Given that the lung nodules have not changed significantly in size it is safe to go back and will followup. I do want do a CT angiogram on his next  visit to check the lung nodule the last time. There remained stable after that we can hopefully go back to doing MR angiograms  Plan:  Return in one year with CT angiogram of chest

## 2013-12-05 ENCOUNTER — Ambulatory Visit: Payer: Medicare Other | Admitting: Cardiothoracic Surgery

## 2013-12-05 ENCOUNTER — Other Ambulatory Visit: Payer: Medicare Other

## 2014-09-15 ENCOUNTER — Encounter (HOSPITAL_COMMUNITY): Payer: Self-pay | Admitting: *Deleted

## 2014-09-15 ENCOUNTER — Encounter (HOSPITAL_COMMUNITY)
Admission: RE | Disposition: A | Discharge status: Home / Self Care | Payer: Self-pay | Source: Ambulatory Visit | Attending: Ophthalmology

## 2014-09-15 ENCOUNTER — Ambulatory Visit (HOSPITAL_COMMUNITY)
Admission: RE | Admit: 2014-09-15 | Discharge: 2014-09-15 | Disposition: A | Discharge status: Home / Self Care | Payer: Medicare Other | Source: Ambulatory Visit | Attending: Ophthalmology | Admitting: Ophthalmology

## 2014-09-15 DIAGNOSIS — Z86718 Personal history of other venous thrombosis and embolism: Secondary | ICD-10-CM | POA: Insufficient documentation

## 2014-09-15 DIAGNOSIS — H26491 Other secondary cataract, right eye: Secondary | ICD-10-CM | POA: Insufficient documentation

## 2014-09-15 DIAGNOSIS — Z79899 Other long term (current) drug therapy: Secondary | ICD-10-CM | POA: Diagnosis not present

## 2014-09-15 DIAGNOSIS — E119 Type 2 diabetes mellitus without complications: Secondary | ICD-10-CM | POA: Insufficient documentation

## 2014-09-15 DIAGNOSIS — Z7901 Long term (current) use of anticoagulants: Secondary | ICD-10-CM | POA: Insufficient documentation

## 2014-09-15 HISTORY — PX: YAG LASER APPLICATION: SHX6189

## 2014-09-15 SURGERY — TREATMENT, USING YAG LASER
Anesthesia: LOCAL | Laterality: Right

## 2014-09-15 MED ORDER — TROPICAMIDE 1 % OP SOLN
1.0000 [drp] | OPHTHALMIC | Status: AC
  Administered 2014-09-15 (×2): 1 [drp] via OPHTHALMIC

## 2014-09-15 MED ORDER — TETRACAINE HCL 0.5 % OP SOLN
1.0000 [drp] | Freq: Once | OPHTHALMIC | Status: AC
  Administered 2014-09-15: 1 [drp] via OPHTHALMIC

## 2014-09-15 MED ORDER — TROPICAMIDE 1 % OP SOLN
OPHTHALMIC | Status: AC
  Filled 2014-09-15: qty 3

## 2014-09-15 MED ORDER — TETRACAINE HCL 0.5 % OP SOLN
OPHTHALMIC | Status: AC
  Filled 2014-09-15: qty 2

## 2014-09-15 NOTE — Brief Op Note (Signed)
[name] 09/15/2014  [name], MD  Pre-op Diagnosis:  secondary cataract right eye  Post-op Diagnosis: * No post-op diagnosis entered *  Yag laser self-test completed: Yes.    Indications:  See scanned office H&P  Procedure:   YAG  Posterior capsulotomy  right eye  Eye protection worn by staff:  Yes.   Laser In Use sign on door:  Yes.    Laser:  {LUMENIS YAG/SLT LASER: selecta duet  Power Setting:  1.7 mJ/burst Anatomical site treated:  Posterior capsule OD Number of applications:  49 Total energy delivered: 80.64 mJ Results:  Clear visual axis, with large free floater  The patient was discharged home with instructions to continue all his current glaucoma medications in the un-operated eye, and discontinue all his current glaucoma medications, if any.  Patient was instructed to go to the office, as previously scheduled, for intraocular pressure:  Yes.    Patient verbalizes understanding of discharge instructions:  Yes.    Notes:  Pt. Tolerated procedure well.  No complications

## 2014-09-15 NOTE — Discharge Instructions (Signed)
CRIT OBREMSKI  09/15/2014     Instructions    Activity: No Restrictions.   Diet: Resume Diet you were on at home.   Pain Medication: Tylenol if Needed.   CONTACT YOUR DOCTOR IF YOU HAVE PAIN, REDNESS IN YOUR EYE, OR DECREASED VISION.   Follow-up: 10/07/2014 at 1:30  with [name], MD.    Dr. Iona Hansen: [phone]     If you find that you cannot contact your physician, but feel that your signs and   Symptoms warrant a physician's attention, call the Emergency Room at   [phone] [phone].   Marland Kitchen

## 2014-09-15 NOTE — H&P (Signed)
I have reviewed the pre printed H&P, the patient was re-examined, and I have identified no significant interval changes in the patient's medical condition.  There is no change in the plan of care since the history and physical of record. 

## 2014-09-16 ENCOUNTER — Encounter (HOSPITAL_COMMUNITY): Payer: Self-pay | Admitting: Ophthalmology

## 2014-10-06 DIAGNOSIS — R0602 Shortness of breath: Secondary | ICD-10-CM | POA: Insufficient documentation

## 2014-11-03 ENCOUNTER — Telehealth: Payer: Self-pay | Admitting: Neurology

## 2014-11-03 NOTE — Telephone Encounter (Signed)
Patient called and said his doctor from [facility] wants him to have another sleep study.  I see where he had one back in May but is it a CPAP that he needs.  Please let the patient know because he is confused and if he needs one then please place the order in the system.

## 2014-11-03 NOTE — Telephone Encounter (Signed)
I called pt back. Pt is adamant that he did NOT call our office and we called him to set up a sleep study because Dr. Gerarda Fraction wanted it. I advised him that there are not orders in the system for him to have another sleep study, and I offered him an appt with Dr. Brett Fairy to discuss what he needs. He refused, saying that he "is not coming here just to talk". He demanded that we call Dr. Nolon Rod office and discuss it with him. I asked if he is already on cpap, and he verified that he is.   I will sent to our sleep lab manager to discuss with pt.

## 2014-11-04 ENCOUNTER — Telehealth: Payer: Self-pay

## 2014-11-11 NOTE — Telephone Encounter (Signed)
Talked with patient about oximetry download. Explained that this could be due to having obstructions while on cpap. Advised him to bring machine so we could download chip. Can you call and make him an appointment. I will scan oximetry results in. He would like help on co-pay for his visit.

## 2014-11-11 NOTE — Telephone Encounter (Signed)
Dr. Brett Fairy informed of situation. She advises a f/u appt.  Called pt's home phone, no answering machine, just keeps ringing. Left a message on pt's cell phone to call me back to schedule an appt.

## 2014-11-12 NOTE — Telephone Encounter (Signed)
Called pt's home phone, keeps ringing.

## 2014-11-12 NOTE — Telephone Encounter (Signed)
Pt is requesting co-pay assistance for his appointment on 12/11/14.

## 2014-11-12 NOTE — Telephone Encounter (Signed)
Pt called back and made an appt for 12/11/14.

## 2014-11-19 ENCOUNTER — Other Ambulatory Visit: Payer: Self-pay | Admitting: *Deleted

## 2014-11-19 DIAGNOSIS — I712 Thoracic aortic aneurysm, without rupture, unspecified: Secondary | ICD-10-CM

## 2014-12-08 ENCOUNTER — Other Ambulatory Visit: Payer: Self-pay | Admitting: Thoracic Surgery (Cardiothoracic Vascular Surgery)

## 2014-12-08 LAB — CREATININE, ISTAT: Creatinine, IStat: 1.1 mg/dL (ref 0.6–1.3)

## 2014-12-09 ENCOUNTER — Ambulatory Visit (INDEPENDENT_AMBULATORY_CARE_PROVIDER_SITE_OTHER): Payer: Medicare Other | Admitting: Thoracic Surgery (Cardiothoracic Vascular Surgery)

## 2014-12-09 ENCOUNTER — Ambulatory Visit
Admission: RE | Admit: 2014-12-09 | Discharge: 2014-12-09 | Disposition: A | Discharge status: Home / Self Care | Payer: Medicare Other | Source: Ambulatory Visit | Attending: Thoracic Surgery (Cardiothoracic Vascular Surgery) | Admitting: Thoracic Surgery (Cardiothoracic Vascular Surgery)

## 2014-12-09 ENCOUNTER — Encounter: Payer: Self-pay | Admitting: Thoracic Surgery (Cardiothoracic Vascular Surgery)

## 2014-12-09 VITALS — BP 150/90 | HR 90 | Resp 20 | Ht 68.0 in | Wt 295.0 lb

## 2014-12-09 DIAGNOSIS — R918 Other nonspecific abnormal finding of lung field: Secondary | ICD-10-CM | POA: Diagnosis not present

## 2014-12-09 DIAGNOSIS — I77810 Thoracic aortic ectasia: Secondary | ICD-10-CM

## 2014-12-09 DIAGNOSIS — I712 Thoracic aortic aneurysm, without rupture, unspecified: Secondary | ICD-10-CM

## 2014-12-09 MED ORDER — IOPAMIDOL (ISOVUE-370) INJECTION 76%
75.0000 mL | Freq: Once | INTRAVENOUS | Status: AC | PRN
  Administered 2014-12-09: 75 mL via INTRAVENOUS

## 2014-12-09 MED ORDER — METOPROLOL SUCCINATE ER 25 MG PO TB24: 25.0000 mg | ORAL_TABLET | Freq: Every day | ORAL | Status: AC

## 2014-12-09 NOTE — Progress Notes (Signed)
[address]       [place] [address]             [phone]       HPI: Mr. Dorner returns for a scheduled 1 year follow-up visit regarding his ascending aneurysm.  Mr. 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 I have been following him for several years. About 18 months ago a CT showed a questionable increase in size of one of the lung nodules, so we did a follow-up at 6 months. The nodule is unchanged and we went back to annual follow-up.  In the interim since his last visit he was diagnosed with Addison's disease. He is on Decadron and testosterone.  He is not having chest pain. He does get short of breath with exertion, but it is unchanged. He does get swelling in his legs. Bruises very easily.  Past Medical History  Diagnosis Date  . Diabetes mellitus     type II  . Obesity   . Osteoarthritis   . Venous insufficiency     lower extremities  . Cholecystitis   . AAA (abdominal aortic aneurysm) ([place])     followed by Dr. Sherren Mocha Early  . Hyperlipidemia   . Clotting disorder ([place])     history of DVT  . Arthritis   . Lung nodules   . Aortic aneurysm without rupture ([place])   . Hypertension       Current Outpatient Prescriptions  Medication Sig Dispense Refill  . acetaminophen (TYLENOL) 650 MG CR tablet Take 650 mg by mouth every 8 (eight) hours as needed for pain.    Marland Kitchen atorvastatin (LIPITOR) 20 MG tablet Take 20 mg by mouth at bedtime.      . clotrimazole-betamethasone (LOTRISONE) cream Apply 1 application topically 2 (two) times daily.    Marland Kitchen dexamethasone (DECADRON) 0.75 MG tablet Take 0.75 mg by mouth daily.    . furosemide (LASIX) 20 MG tablet Take 60 mg by mouth daily.     Marland Kitchen gabapentin (NEURONTIN) 100 MG tablet Take 100 mg by mouth 2 (two) times daily.     Marland Kitchen HYDROmorphone (DILAUDID) 4 MG tablet Take 4 mg by mouth every 6 (six) hours as needed for moderate pain.     Marland Kitchen  omeprazole (PRILOSEC) 20 MG capsule Take 20 mg by mouth daily.      . Potassium 99 MG TABS Take 1 tablet by mouth daily.    Marland Kitchen testosterone enanthate (DELATESTRYL) 200 MG/ML injection Inject into the muscle every 14 (fourteen) days. For IM use only    . warfarin (COUMADIN) 2 MG tablet Take 2 mg by mouth daily. Takes with a 7.5 mg tablet for total dose of 9.5 mg    . warfarin (COUMADIN) 7.5 MG tablet Take 7.5 mg by mouth daily. Takes with a 2 mg tablet for total dose of 9.5 mg    . metoprolol succinate (TOPROL XL) 25 MG 24 hr tablet Take 1 tablet (25 mg total) by mouth daily. 30 tablet 6   No current facility-administered medications for this visit.   Facility-Administered Medications Ordered in Other Visits  Medication Dose Route Frequency Provider Last Rate Last Dose  . fentaNYL (SUBLIMAZE) injection 25-50 mcg  25-50 mcg Intravenous Q5 min PRN Lerry Liner, MD        Physical Exam BP 150/90 mmHg  Pulse 90  Resp 20  Ht 5\' 8"  (  1.727 m)  Wt 295 lb (133.811 kg)  BMI 44.86 kg/m2  SpO2 55% Obese 74 year old man in no acute distress Alert and oriented 3 with no focal neurologic deficits Cardiac regular rate and rhythm with a 2/6 systolic murmur Lungs clear with equal breath sounds bilaterally  Diagnostic Tests: I personally reviewed the CT chest and concur with the findings as noted below.  CT ANGIOGRAPHY CHEST WITH CONTRAST  TECHNIQUE: Multidetector CT imaging of the chest was performed using the standard protocol during bolus administration of intravenous contrast. Multiplanar CT image reconstructions and MIPs were obtained to evaluate the vascular anatomy.  CONTRAST: 75 mL Isovue 370  COMPARISON: 12/03/2013  FINDINGS: The lungs are well aerated bilaterally without focal infiltrate or sizable effusion. Bilateral small lower lobe nodules are again seen and stable from the prior study. These again have been stable from 2013 and are consistent with benign  nodules.  The thoracic inlet is within normal limits. The thoracic aorta demonstrates some mild calcific changes. Mild aneurysmal dilatation is noted to 4.2 cm. Given some slight variation in the imaging technique this likely is stable in appearance. No evidence of dissection is identified. The descending thoracic aorta tapers in a normal fashion. The origins of the celiac axis and superior mesenteric artery are within normal limits.  The pulmonary artery shows no evidence of pulmonary emboli. No hilar or mediastinal adenopathy is noted.  Visualized upper abdomen reveals no acute abnormality. Degenerative changes of the thoracic spine are seen. No acute bony abnormality is noted.  Review of the MIP images confirms the above findings.  IMPRESSION: Stable aneurysmal dilatation of the ascending aorta. No dissection is noted.  Stable lung nodules bilaterally. These have been stable for 3 years and no further follow-up is necessary.   Electronically Signed  By: Inez Catalina M.D.  On: 12/09/2014 13:10  Impression: 74 year old man with a 4.2 cm ascending aneurysm. This is unchanged.  Of concern is that his blood pressure is elevated at 150/90 today. He says it was checked about 2 weeks ago and the diastolic pressure was 90 at that time as well. I think he should be on a beta blocker at this point. I talked him about prescribing that for him following up with Dr. Gerarda Fraction. He wanted me to go ahead and prescribe it for him. I gave her a prescription for Toprol-XL 25 mg daily, 30 tablets, 6 refills. He does need to follow-up with Dr. Gerarda Fraction in the next month or so regarding his blood pressure.  His small bilateral lung nodules are unchanged over 3 years. They are benign.  Plan:  Start Toprol-XL 25 mg daily  Return in one year with CT angiogram of chest  Melrose Nakayama, MD [facility] [phone]

## 2014-12-11 ENCOUNTER — Ambulatory Visit (INDEPENDENT_AMBULATORY_CARE_PROVIDER_SITE_OTHER): Payer: Medicare Other | Admitting: Neurology

## 2014-12-11 ENCOUNTER — Encounter: Payer: Self-pay | Admitting: Neurology

## 2014-12-11 ENCOUNTER — Encounter (INDEPENDENT_AMBULATORY_CARE_PROVIDER_SITE_OTHER): Payer: Self-pay

## 2014-12-11 VITALS — BP 138/80 | HR 68 | Resp 20 | Ht 68.0 in | Wt 292.0 lb

## 2014-12-11 DIAGNOSIS — E662 Morbid (severe) obesity with alveolar hypoventilation: Secondary | ICD-10-CM | POA: Diagnosis not present

## 2014-12-11 DIAGNOSIS — F1190 Opioid use, unspecified, uncomplicated: Secondary | ICD-10-CM

## 2014-12-11 DIAGNOSIS — Z9114 Patient's other noncompliance with medication regimen: Secondary | ICD-10-CM

## 2014-12-11 DIAGNOSIS — E271 Primary adrenocortical insufficiency: Secondary | ICD-10-CM

## 2014-12-11 DIAGNOSIS — G4737 Central sleep apnea in conditions classified elsewhere: Secondary | ICD-10-CM | POA: Diagnosis not present

## 2014-12-11 DIAGNOSIS — J9611 Chronic respiratory failure with hypoxia: Secondary | ICD-10-CM | POA: Insufficient documentation

## 2014-12-11 DIAGNOSIS — J9612 Chronic respiratory failure with hypercapnia: Secondary | ICD-10-CM | POA: Diagnosis not present

## 2014-12-11 NOTE — Progress Notes (Signed)
[facility]  Provider:  Larey Seat, [place] Referring Provider: Redmond School, MD Primary Care Physician:  Glo Herring., MD  Chief Complaint  Patient presents with  . Nocturnal Hypoxemia    [name] is here with his wife [name] for f/u of nocturnal hypoxemia and snoring.  He uses both CPAP and Oxygen.  Sts. he is compliant with CPAP but feels he sleeps better without it (mask makes him feel claustrophobic).  Sts. he feels he does better with nocturnal oxygen alone/fim  . Snoring    HPI:  [name] is a 74 y.o. married , right  handed  male , who is seen here as a referral  from Dr. Gerarda Fraction for a sleep evaluation;   [name] presents today after a pulse oximetry performed on room air on 06-08-13 revealed severe nocturnal hypoxemia. He also had tachybradycardia arrhythmia the highest pulse rate or 100 the lowest at 28 beats per minute. The over all baseline was only 81.5. At or below 89% saturations were 7 hours and 59 minutes. Also there is a high risk of artifact in the nocturnal pulse oximetry the tachybradycardia arrhythmia indicates that he truly may half apnea this hypoxemia on top.  Complicating is a history of pulmonary nodules that have been followed every 6 months by his primary care physician and since all for her to have been stationary. He also has gained weight over the last 5 years partially due to being treated for arthritis with prednisone. 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 He does not have nocturia, saw the 6 periods of severe hypoxemia as documented in his pulse oximetry report unlikely REM-dependent hypoxemia.  He was just recently started on prednisone again and he is taking testosterone supplements. He is also on Coumadin for the treatment and prevention of DVTs.  He is on Lipitor,  Prilosec.  He has hypertension which appears to be controlled he has chronic pain which Dr.FUSCO  has treated and he has a narcotic contract with his provider.  The patient is a former smoker, he  quit 14 years ago and he does not drink alcohol.  he has no true bed time, sleeps on and off all day and night. He doesn't have a rise time, either. It can be  3 AM to 8 AM. He  Is orthopneic. When in bed, he sleeps on 3 pillows.  Dr. Gerarda Fraction already ordered  Oxygen at 2 liters at night .  [name] goes to sleep in a recliner,at 5 pm, often all afternoon and into the night. His sleep is fragmented into 90 minute blocks, due to pain . His pain medication makes him sleepy, he cannot exercise and gained over 35 pounds. He snores loudly in the recliner, and his wife witnessed severe apneas. He doesn't kick a lot at night. He breathes shallow during the day. He uses narcotics just at bedtime, but doesn't Use his gabapentin more than twice a day ( prescribed as TID).  No  parasomnia activity, no dreams reported. No family history of OSA .   PLAN : SPLIT at AHI 15 , score at 4%  Oxygen titration as possible, patient uses 2 liters at home, we don't have a ONO on 02 yet. Weight loss discussed, CPAP explained. 60 minute visit . He needs to take meds as prescribed. He will need CO2 monitoring . Consider using gabapentin at 600 mg at night in exchange for narcotic meds.   12-11-14,  Interval history [name] returns today for a new sleep consultation. He is a patient with known sleep apnea will had undergone a sleep test and had been placed on a CPAP machine. On 07/02/2013 the patient underwent a polysomnography which revealed a very mild apnea of 6.3 RDI 6.3, REM accentuated AHI was 60. The main concern was that this patient retained CO2 and his oxygen nadir was 67% he was desaturated 4 total time of 95.5 minutes. CO2 rose during REM sleep to 57.54. Torr. He had no periodic limb movements and his heart rate  was stable between 80 and 91 bpm. With CPAP titrated to 11 cm water his hypoxemia did not improve much 3 L of oxygen had to be bled into the CPAP machine still a total desaturation time of 276 minutes was noted. The AHI was reduced to 1.6 and the oxygen nadir rose to 82% which is still not satisfactory. The patient had been fitted with a so-called pico nasal mask of medium size, but she found at home not to be tolerable. He changed to a full face mask but had air leaks. He is a habitual mouth breather he also has full facial hair. He has been unable to use the CPAP more than 3 hours on average at night and recently became noncompliant. Dr. Riley Kill repeated an overnight pulse oximetry while the patient was on CPAP at least for part of the night as he recalls. This was on 09-19-14 the patient's oxygen saturation at or below 89% was 39 minutes 56 seconds or 10% of the night. He does qualify for continues oxygen use. Given the patient's clinical experience and the data I would consider discontinuing CPAP therapy and placing him on oxygen only.    Review of Systems: Out of a complete 14 system review, the patient complains of only the following symptoms, and all other reviewed systems are negative. The patient reports insomnia often laying awake a couple of hours before going to sleep, his fatigue severity score is 61 and his Epworth sleepiness score is still 12 points. He has not been compliant with CPAP use, uses 3 liters  Nasal canula  Oxygen. He endorsed the geriatric depression score today at 4 points.     Social History   Social History  . Marital Status: Married    Spouse Name: [name]  . Number of Children: 2  . Years of Education: 12   Occupational History  .     Social History Main Topics  . Smoking status: Former Smoker    Types: Cigarettes    Quit date: 07/24/1999  . Smokeless tobacco: Never Used  . Alcohol Use: No  . Drug Use: No  . Sexual Activity: Not on file   Other Topics  Concern  . Not on file   Social History Narrative   Patient is married Administrator, [employer]) and lives at home with his wife.   Patient has two children.   Patient is retired.   Patient has a high school education.   Patient  is right-handed.   Patient drinks 4 cups of coffee and 2 cups of diet soda daily./    Family History  Problem Relation Age of Onset  . Heart disease Mother   . Cervical cancer Mother   . Stroke Father   . Diabetes Father   . Arthritis Sister   . Cancer Brother     bladder  . Cancer Sister     cervical  . Pancreatic cancer Other   . Prostate cancer Other   . Anesthesia problems Neg Hx   . Hypotension Neg Hx   . Malignant hyperthermia Neg Hx   . Pseudochol deficiency Neg Hx     Past Medical History  Diagnosis Date  . Diabetes mellitus     type II  . Obesity   . Osteoarthritis   . Venous insufficiency     lower extremities  . Cholecystitis   . AAA (abdominal aortic aneurysm) ([place])     followed by Dr. Sherren Mocha Early  . Hyperlipidemia   . Clotting disorder ([place])     history of DVT  . Arthritis   . Lung nodules   . Aortic aneurysm without rupture ([place])   . Hypertension     Past Surgical History  Procedure Laterality Date  . Cholecystectomy    . Inguinal hernia repair    . Testicle surgery  2004    Dr. [name]  . Cataract extraction w/phaco  10/21/2010    Procedure: CATARACT EXTRACTION PHACO AND INTRAOCULAR LENS PLACEMENT (IOC);  Surgeon: Tonny Branch;  Location: [facility];  Service: Ophthalmology;  Laterality: Left;  CDE:16.80  . Yag laser application Right 10/20/6211    Procedure: YAG LASER APPLICATION;  Surgeon: [name], MD;  Location: [facility];  Service: Ophthalmology;  Laterality: Right;    Current Outpatient Prescriptions  Medication Sig Dispense Refill  . atorvastatin (LIPITOR) 20 MG tablet Take 20 mg by mouth at bedtime.      . clotrimazole-betamethasone (LOTRISONE) cream Apply 1 application topically 2 (two) times daily.    . furosemide (LASIX) 20  MG tablet Take 60 mg by mouth daily.     Marland Kitchen gabapentin (NEURONTIN) 100 MG tablet Take 100 mg by mouth 2 (two) times daily.     Marland Kitchen HYDROmorphone (DILAUDID) 4 MG tablet Take 4 mg by mouth every 6 (six) hours as needed for moderate pain.     . metoprolol succinate (TOPROL XL) 25 MG 24 hr tablet Take 1 tablet (25 mg total) by mouth daily. 30 tablet 6  . omeprazole (PRILOSEC) 20 MG capsule Take 20 mg by mouth daily.      . Potassium 99 MG TABS Take 1 tablet by mouth daily.    . predniSONE (DELTASONE) 10 MG tablet Take 10 mg by mouth daily with breakfast. Take 1 in the morning and 1/2 at hs.    . warfarin (COUMADIN) 2 MG tablet Take 2 mg by mouth daily. Takes with a 7.5 mg tablet for total dose of 9.5 mg    . warfarin (COUMADIN) 7.5 MG tablet Take 7.5 mg by mouth daily. Takes with a 2 mg tablet for total dose of 9.5 mg    . acetaminophen (TYLENOL) 650 MG CR tablet Take 650 mg by mouth every 8 (eight) hours as needed for pain.    Marland Kitchen dexamethasone (DECADRON) 0.75 MG tablet Take 0.75 mg by mouth daily.    Marland Kitchen testosterone enanthate (DELATESTRYL) 200 MG/ML injection Inject into the muscle every 14 (fourteen) days. For IM use only  No current facility-administered medications for this visit.   Facility-Administered Medications Ordered in Other Visits  Medication Dose Route Frequency Provider Last Rate Last Dose  . fentaNYL (SUBLIMAZE) injection 25-50 mcg  25-50 mcg Intravenous Q5 min PRN Lerry Liner, MD        Allergies as of 12/11/2014 - Review Complete 12/11/2014  Allergen Reaction Noted  . Aspirin Other (See Comments) 10/14/2010  . Claritin-d [loratadine-pseudoephedrine er]  05/28/2010  . Penicillins  03/26/2010  . Sulfa antibiotics  03/26/2010    Vitals: BP 138/80 mmHg  Pulse 68  Resp 20  Ht _0  (1.727 m)  Wt 292 lb (132.45 kg)  BMI 44.41 kg/m2 Last Weight:  Wt Readings from Last 1 Encounters:  12/11/14 292 lb (132.45 kg)   Last Height:   Ht Readings from Last 1 Encounters:   12/11/14 _1  (1.727 m)   Physical exam: General: The patient is awake, alert and appears not in acute distress. The patient is well groomed. Head: Normocephalic, atraumatic. Neck is supple. Mallampati 4 , neck circumference:20, no TMJ ,nasal passage unobstructed.  Cardiovascular:  Regular rate and rhythm, with right sided  carotid bruit,  without distended neck veins. Respiratory: Lungs are clear to auscultation. Apex/ excavatum. Barrel chest. Skin:   evidence of  Ankle edema, petechia , varicose veins. He wears compression stockings.  He has a pressure ulcer on the left ankle.  Trunk: BMI is  elevated and patient  has developed a hunched posture,  Neurologic exam : The patient is awake and alert, oriented to place and time.   Memory subjective described as intact.  There is a normal attention span & concentration ability. Speech is fluent without  dysarthria, dysphonia or aphasia.  Mood and affect are appropriate, he is a little testy when his wife interrupts him.  Cranial nerves: Pupils are equal and briskly reactive to light. Funduscopic exam without  evidence of pallor or edema.  Extraocular movements  in vertical and horizontal planes intact and without nystagmus. Visual fields by finger perimetry are intact. Hearing to finger rub intact.  Facial sensation intact to fine touch. Facial motor strength is symmetric and tongue and uvula move midline. Motor exam:  Normal tone, reduced muscle bulk and symmetric strength in all extremities. Sensory:  Fine touch, pinprick and vibration were tested in all extremities. Proprioception is tested in the upper extremities only. This was normal. Coordination: Rapid alternating movements in the fingers/hands is tested and normal.  -to-nose maneuver tested and normal without evidence of ataxia, dysmetria or tremor. Gait and station: Patient walks with assistive device, he walks with a cane - new since last year .closes in both hips, both knees .Stance  is stable, but wide based .  Tandem gait is deferred. Deep tendon reflexes: in the  upper and lower extremities are attenuated, symmetrically. Babinski maneuver response is equivocal.    Assessment:  After physical and neurologic examination, review of laboratory studies, imaging, neurophysiology testing and pre-existing records, 35 minute  Assessment with more than 50% of the face to face time dedicated to discussion, education and coordination of care.  1)  Hypoventilation syndrome with hypoxemia. Confirmed CO2 retainer. Narcotic pain medication continued, hydromorphone- along with obesity the cause of hypoventilation, decreased respiratory drive. Plus, he has an underlying pulmonary disease.  2) his sleep study confirmed a very mild apnea but significant hypoxemia for prolonged period of time and CO2 retention. CPAP did not reduce the hypoxemia to such a significant degree. I think the only  way to decrease hypoxemia is to give him oxygen and to change his narcotic pain regimen away from nighttime dosing.  3) Hypersomnia . Wife reports personality changes, irritability and impulse control problems - likely related to Narcotics,  depression. Epworth 12 . FSS very high.  4) Addison's disease- causing additional fatigue. Causing changes in muscle bulk and tone.   Plan:  Treatment plan and additional workup :   I will give Scott. Detwiler medical permission to discontinue his CPAP use.  I wish he would have met shortly after his sleep study to discuss the results and the treatment outcome for him.  I do understand that the benefits for his sleep patterns were marginal. He continues to take daytime naps in a recliner and sleeps in bed only at night- never wore CPAP during nap time. He reports deeper , more restfull sleep in the recliner. 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   I propose to continue oxygen use. I am not quite sure about the validity of his  home oxygen study because I'm not sure how long he used CPAP that night reportedly he did not use it all night while the study was done,  Wore oxygen for about 4 hours at night. I suugested a daytime trial of CPAP while in the recliner with oxygen atached. He will report to me in 30 days.  Still desaturating. The test were discussed in detail.  I would like for the patient to use oxygen when he sleeps in his recliner as well as at night. I recommend to advance the last intake of narcotics to at least 3 hours prior to sleep time.   CC;  Dr Gerarda Fraction.  Rv  30 days, with CPAP. I changed the setting to auto 5-10 cm water with 3 cm EPR .

## 2014-12-11 NOTE — Addendum Note (Signed)
Addended by: Larey Seat on: 12/11/2014 10:33 AM   Modules accepted: Orders

## 2014-12-16 ENCOUNTER — Ambulatory Visit: Payer: Medicare Other | Admitting: Neurology

## 2015-01-15 ENCOUNTER — Ambulatory Visit: Payer: Medicare Other | Admitting: Adult Health

## 2015-01-30 ENCOUNTER — Ambulatory Visit (HOSPITAL_COMMUNITY): Payer: Medicare Other

## 2015-01-30 ENCOUNTER — Other Ambulatory Visit (HOSPITAL_COMMUNITY): Payer: Self-pay | Admitting: Family Medicine

## 2015-01-30 DIAGNOSIS — M4802 Spinal stenosis, cervical region: Secondary | ICD-10-CM

## 2015-01-30 DIAGNOSIS — M48061 Spinal stenosis, lumbar region without neurogenic claudication: Secondary | ICD-10-CM

## 2015-02-03 ENCOUNTER — Telehealth: Payer: Self-pay

## 2015-02-03 ENCOUNTER — Ambulatory Visit: Payer: Medicare Other | Admitting: Neurology

## 2015-02-03 NOTE — Telephone Encounter (Signed)
Pt did not show for their appt with Dr. Dohmeier today.  

## 2015-02-04 ENCOUNTER — Encounter: Payer: Self-pay | Admitting: Neurology

## 2015-02-19 DIAGNOSIS — E271 Primary adrenocortical insufficiency: Secondary | ICD-10-CM | POA: Diagnosis not present

## 2015-02-19 DIAGNOSIS — I10 Essential (primary) hypertension: Secondary | ICD-10-CM | POA: Diagnosis not present

## 2015-02-19 DIAGNOSIS — J441 Chronic obstructive pulmonary disease with (acute) exacerbation: Secondary | ICD-10-CM | POA: Diagnosis not present

## 2015-02-19 DIAGNOSIS — Z87891 Personal history of nicotine dependence: Secondary | ICD-10-CM | POA: Diagnosis not present

## 2015-02-19 DIAGNOSIS — Z86718 Personal history of other venous thrombosis and embolism: Secondary | ICD-10-CM | POA: Diagnosis not present

## 2015-02-19 DIAGNOSIS — M4806 Spinal stenosis, lumbar region: Secondary | ICD-10-CM | POA: Diagnosis not present

## 2015-02-19 DIAGNOSIS — Z7901 Long term (current) use of anticoagulants: Secondary | ICD-10-CM | POA: Diagnosis not present

## 2015-02-20 DIAGNOSIS — Z86718 Personal history of other venous thrombosis and embolism: Secondary | ICD-10-CM | POA: Diagnosis not present

## 2015-02-20 DIAGNOSIS — I10 Essential (primary) hypertension: Secondary | ICD-10-CM | POA: Diagnosis not present

## 2015-02-20 DIAGNOSIS — Z7901 Long term (current) use of anticoagulants: Secondary | ICD-10-CM | POA: Diagnosis not present

## 2015-02-20 DIAGNOSIS — E271 Primary adrenocortical insufficiency: Secondary | ICD-10-CM | POA: Diagnosis not present

## 2015-02-20 DIAGNOSIS — Z87891 Personal history of nicotine dependence: Secondary | ICD-10-CM | POA: Diagnosis not present

## 2015-02-20 DIAGNOSIS — J441 Chronic obstructive pulmonary disease with (acute) exacerbation: Secondary | ICD-10-CM | POA: Diagnosis not present

## 2015-02-20 DIAGNOSIS — M4806 Spinal stenosis, lumbar region: Secondary | ICD-10-CM | POA: Diagnosis not present

## 2015-02-24 DIAGNOSIS — M4806 Spinal stenosis, lumbar region: Secondary | ICD-10-CM | POA: Diagnosis not present

## 2015-02-24 DIAGNOSIS — Z87891 Personal history of nicotine dependence: Secondary | ICD-10-CM | POA: Diagnosis not present

## 2015-02-24 DIAGNOSIS — J441 Chronic obstructive pulmonary disease with (acute) exacerbation: Secondary | ICD-10-CM | POA: Diagnosis not present

## 2015-02-24 DIAGNOSIS — E271 Primary adrenocortical insufficiency: Secondary | ICD-10-CM | POA: Diagnosis not present

## 2015-02-24 DIAGNOSIS — Z86718 Personal history of other venous thrombosis and embolism: Secondary | ICD-10-CM | POA: Diagnosis not present

## 2015-02-24 DIAGNOSIS — Z7901 Long term (current) use of anticoagulants: Secondary | ICD-10-CM | POA: Diagnosis not present

## 2015-02-24 DIAGNOSIS — I10 Essential (primary) hypertension: Secondary | ICD-10-CM | POA: Diagnosis not present

## 2015-02-25 DIAGNOSIS — G4733 Obstructive sleep apnea (adult) (pediatric): Secondary | ICD-10-CM | POA: Diagnosis not present

## 2015-02-25 DIAGNOSIS — J441 Chronic obstructive pulmonary disease with (acute) exacerbation: Secondary | ICD-10-CM | POA: Diagnosis not present

## 2015-02-25 DIAGNOSIS — Z87891 Personal history of nicotine dependence: Secondary | ICD-10-CM | POA: Diagnosis not present

## 2015-02-25 DIAGNOSIS — Z882 Allergy status to sulfonamides status: Secondary | ICD-10-CM | POA: Diagnosis not present

## 2015-02-25 DIAGNOSIS — K219 Gastro-esophageal reflux disease without esophagitis: Secondary | ICD-10-CM | POA: Diagnosis not present

## 2015-02-25 DIAGNOSIS — Z888 Allergy status to other drugs, medicaments and biological substances status: Secondary | ICD-10-CM | POA: Diagnosis not present

## 2015-02-25 DIAGNOSIS — Z7901 Long term (current) use of anticoagulants: Secondary | ICD-10-CM | POA: Diagnosis not present

## 2015-02-25 DIAGNOSIS — R0602 Shortness of breath: Secondary | ICD-10-CM | POA: Diagnosis not present

## 2015-02-25 DIAGNOSIS — Z880 Allergy status to penicillin: Secondary | ICD-10-CM | POA: Diagnosis not present

## 2015-02-25 DIAGNOSIS — Z886 Allergy status to analgesic agent status: Secondary | ICD-10-CM | POA: Diagnosis not present

## 2015-02-25 DIAGNOSIS — E272 Addisonian crisis: Secondary | ICD-10-CM | POA: Diagnosis not present

## 2015-02-25 DIAGNOSIS — I872 Venous insufficiency (chronic) (peripheral): Secondary | ICD-10-CM | POA: Diagnosis not present

## 2015-02-25 DIAGNOSIS — E039 Hypothyroidism, unspecified: Secondary | ICD-10-CM | POA: Diagnosis not present

## 2015-02-25 DIAGNOSIS — I82509 Chronic embolism and thrombosis of unspecified deep veins of unspecified lower extremity: Secondary | ICD-10-CM | POA: Diagnosis not present

## 2015-02-25 DIAGNOSIS — Z1389 Encounter for screening for other disorder: Secondary | ICD-10-CM | POA: Diagnosis not present

## 2015-02-25 DIAGNOSIS — R0902 Hypoxemia: Secondary | ICD-10-CM | POA: Diagnosis not present

## 2015-02-25 DIAGNOSIS — I10 Essential (primary) hypertension: Secondary | ICD-10-CM | POA: Diagnosis not present

## 2015-02-25 DIAGNOSIS — I4891 Unspecified atrial fibrillation: Secondary | ICD-10-CM | POA: Diagnosis not present

## 2015-02-25 DIAGNOSIS — J9690 Respiratory failure, unspecified, unspecified whether with hypoxia or hypercapnia: Secondary | ICD-10-CM | POA: Diagnosis not present

## 2015-02-25 DIAGNOSIS — E271 Primary adrenocortical insufficiency: Secondary | ICD-10-CM | POA: Diagnosis not present

## 2015-02-25 DIAGNOSIS — Z79899 Other long term (current) drug therapy: Secondary | ICD-10-CM | POA: Diagnosis not present

## 2015-02-25 DIAGNOSIS — Z9981 Dependence on supplemental oxygen: Secondary | ICD-10-CM | POA: Diagnosis not present

## 2015-02-25 DIAGNOSIS — M4800 Spinal stenosis, site unspecified: Secondary | ICD-10-CM | POA: Diagnosis not present

## 2015-02-25 DIAGNOSIS — G4730 Sleep apnea, unspecified: Secondary | ICD-10-CM | POA: Diagnosis not present

## 2015-02-26 DIAGNOSIS — I872 Venous insufficiency (chronic) (peripheral): Secondary | ICD-10-CM | POA: Diagnosis not present

## 2015-02-26 DIAGNOSIS — E039 Hypothyroidism, unspecified: Secondary | ICD-10-CM | POA: Diagnosis not present

## 2015-02-26 DIAGNOSIS — J441 Chronic obstructive pulmonary disease with (acute) exacerbation: Secondary | ICD-10-CM | POA: Diagnosis not present

## 2015-02-26 DIAGNOSIS — I10 Essential (primary) hypertension: Secondary | ICD-10-CM | POA: Diagnosis not present

## 2015-02-26 DIAGNOSIS — M4800 Spinal stenosis, site unspecified: Secondary | ICD-10-CM | POA: Diagnosis not present

## 2015-02-26 DIAGNOSIS — I82509 Chronic embolism and thrombosis of unspecified deep veins of unspecified lower extremity: Secondary | ICD-10-CM | POA: Diagnosis not present

## 2015-02-26 DIAGNOSIS — G4733 Obstructive sleep apnea (adult) (pediatric): Secondary | ICD-10-CM | POA: Diagnosis not present

## 2015-02-26 DIAGNOSIS — K219 Gastro-esophageal reflux disease without esophagitis: Secondary | ICD-10-CM | POA: Diagnosis not present

## 2015-02-26 DIAGNOSIS — I4891 Unspecified atrial fibrillation: Secondary | ICD-10-CM | POA: Diagnosis not present

## 2015-02-26 DIAGNOSIS — I517 Cardiomegaly: Secondary | ICD-10-CM | POA: Diagnosis not present

## 2015-02-26 DIAGNOSIS — Z79899 Other long term (current) drug therapy: Secondary | ICD-10-CM | POA: Diagnosis not present

## 2015-02-26 DIAGNOSIS — Z888 Allergy status to other drugs, medicaments and biological substances status: Secondary | ICD-10-CM | POA: Diagnosis not present

## 2015-02-26 DIAGNOSIS — Z87891 Personal history of nicotine dependence: Secondary | ICD-10-CM | POA: Diagnosis not present

## 2015-02-26 DIAGNOSIS — Z880 Allergy status to penicillin: Secondary | ICD-10-CM | POA: Diagnosis not present

## 2015-02-26 DIAGNOSIS — J449 Chronic obstructive pulmonary disease, unspecified: Secondary | ICD-10-CM | POA: Diagnosis not present

## 2015-02-26 DIAGNOSIS — I480 Paroxysmal atrial fibrillation: Secondary | ICD-10-CM | POA: Diagnosis not present

## 2015-02-26 DIAGNOSIS — Z886 Allergy status to analgesic agent status: Secondary | ICD-10-CM | POA: Diagnosis not present

## 2015-02-26 DIAGNOSIS — Z882 Allergy status to sulfonamides status: Secondary | ICD-10-CM | POA: Diagnosis not present

## 2015-02-26 DIAGNOSIS — Z9981 Dependence on supplemental oxygen: Secondary | ICD-10-CM | POA: Diagnosis not present

## 2015-02-26 DIAGNOSIS — Z7901 Long term (current) use of anticoagulants: Secondary | ICD-10-CM | POA: Diagnosis not present

## 2015-02-27 DIAGNOSIS — I4891 Unspecified atrial fibrillation: Secondary | ICD-10-CM | POA: Diagnosis not present

## 2015-02-27 DIAGNOSIS — Z886 Allergy status to analgesic agent status: Secondary | ICD-10-CM | POA: Diagnosis not present

## 2015-02-27 DIAGNOSIS — Z9981 Dependence on supplemental oxygen: Secondary | ICD-10-CM | POA: Diagnosis not present

## 2015-02-27 DIAGNOSIS — Z888 Allergy status to other drugs, medicaments and biological substances status: Secondary | ICD-10-CM | POA: Diagnosis not present

## 2015-02-27 DIAGNOSIS — Z79899 Other long term (current) drug therapy: Secondary | ICD-10-CM | POA: Diagnosis not present

## 2015-02-27 DIAGNOSIS — Z880 Allergy status to penicillin: Secondary | ICD-10-CM | POA: Diagnosis not present

## 2015-02-27 DIAGNOSIS — M4800 Spinal stenosis, site unspecified: Secondary | ICD-10-CM | POA: Diagnosis not present

## 2015-02-27 DIAGNOSIS — K219 Gastro-esophageal reflux disease without esophagitis: Secondary | ICD-10-CM | POA: Diagnosis not present

## 2015-02-27 DIAGNOSIS — I10 Essential (primary) hypertension: Secondary | ICD-10-CM | POA: Diagnosis not present

## 2015-02-27 DIAGNOSIS — I872 Venous insufficiency (chronic) (peripheral): Secondary | ICD-10-CM | POA: Diagnosis not present

## 2015-02-27 DIAGNOSIS — I82509 Chronic embolism and thrombosis of unspecified deep veins of unspecified lower extremity: Secondary | ICD-10-CM | POA: Diagnosis not present

## 2015-02-27 DIAGNOSIS — Z882 Allergy status to sulfonamides status: Secondary | ICD-10-CM | POA: Diagnosis not present

## 2015-02-27 DIAGNOSIS — J441 Chronic obstructive pulmonary disease with (acute) exacerbation: Secondary | ICD-10-CM | POA: Diagnosis not present

## 2015-02-27 DIAGNOSIS — Z7901 Long term (current) use of anticoagulants: Secondary | ICD-10-CM | POA: Diagnosis not present

## 2015-02-27 DIAGNOSIS — G4733 Obstructive sleep apnea (adult) (pediatric): Secondary | ICD-10-CM | POA: Diagnosis not present

## 2015-02-27 DIAGNOSIS — Z87891 Personal history of nicotine dependence: Secondary | ICD-10-CM | POA: Diagnosis not present

## 2015-02-27 DIAGNOSIS — E039 Hypothyroidism, unspecified: Secondary | ICD-10-CM | POA: Diagnosis not present

## 2015-03-01 DIAGNOSIS — M4806 Spinal stenosis, lumbar region: Secondary | ICD-10-CM | POA: Diagnosis not present

## 2015-03-01 DIAGNOSIS — E271 Primary adrenocortical insufficiency: Secondary | ICD-10-CM | POA: Diagnosis not present

## 2015-03-01 DIAGNOSIS — Z86718 Personal history of other venous thrombosis and embolism: Secondary | ICD-10-CM | POA: Diagnosis not present

## 2015-03-01 DIAGNOSIS — Z87891 Personal history of nicotine dependence: Secondary | ICD-10-CM | POA: Diagnosis not present

## 2015-03-01 DIAGNOSIS — I10 Essential (primary) hypertension: Secondary | ICD-10-CM | POA: Diagnosis not present

## 2015-03-01 DIAGNOSIS — J441 Chronic obstructive pulmonary disease with (acute) exacerbation: Secondary | ICD-10-CM | POA: Diagnosis not present

## 2015-03-01 DIAGNOSIS — Z7901 Long term (current) use of anticoagulants: Secondary | ICD-10-CM | POA: Diagnosis not present

## 2015-03-02 DIAGNOSIS — Z87891 Personal history of nicotine dependence: Secondary | ICD-10-CM | POA: Diagnosis not present

## 2015-03-02 DIAGNOSIS — Z7901 Long term (current) use of anticoagulants: Secondary | ICD-10-CM | POA: Diagnosis not present

## 2015-03-02 DIAGNOSIS — Z86718 Personal history of other venous thrombosis and embolism: Secondary | ICD-10-CM | POA: Diagnosis not present

## 2015-03-02 DIAGNOSIS — M4806 Spinal stenosis, lumbar region: Secondary | ICD-10-CM | POA: Diagnosis not present

## 2015-03-02 DIAGNOSIS — E271 Primary adrenocortical insufficiency: Secondary | ICD-10-CM | POA: Diagnosis not present

## 2015-03-02 DIAGNOSIS — J441 Chronic obstructive pulmonary disease with (acute) exacerbation: Secondary | ICD-10-CM | POA: Diagnosis not present

## 2015-03-02 DIAGNOSIS — I10 Essential (primary) hypertension: Secondary | ICD-10-CM | POA: Diagnosis not present

## 2015-03-03 DIAGNOSIS — J9690 Respiratory failure, unspecified, unspecified whether with hypoxia or hypercapnia: Secondary | ICD-10-CM | POA: Diagnosis not present

## 2015-03-03 DIAGNOSIS — I10 Essential (primary) hypertension: Secondary | ICD-10-CM | POA: Diagnosis not present

## 2015-03-03 DIAGNOSIS — Z710 Person encountering health services to consult on behalf of another person: Secondary | ICD-10-CM | POA: Diagnosis not present

## 2015-03-03 DIAGNOSIS — Z87891 Personal history of nicotine dependence: Secondary | ICD-10-CM | POA: Diagnosis not present

## 2015-03-03 DIAGNOSIS — Z86718 Personal history of other venous thrombosis and embolism: Secondary | ICD-10-CM | POA: Diagnosis not present

## 2015-03-03 DIAGNOSIS — Z7901 Long term (current) use of anticoagulants: Secondary | ICD-10-CM | POA: Diagnosis not present

## 2015-03-03 DIAGNOSIS — E271 Primary adrenocortical insufficiency: Secondary | ICD-10-CM | POA: Diagnosis not present

## 2015-03-03 DIAGNOSIS — J441 Chronic obstructive pulmonary disease with (acute) exacerbation: Secondary | ICD-10-CM | POA: Diagnosis not present

## 2015-03-03 DIAGNOSIS — M4806 Spinal stenosis, lumbar region: Secondary | ICD-10-CM | POA: Diagnosis not present

## 2015-03-04 DIAGNOSIS — Z86718 Personal history of other venous thrombosis and embolism: Secondary | ICD-10-CM | POA: Diagnosis not present

## 2015-03-04 DIAGNOSIS — J441 Chronic obstructive pulmonary disease with (acute) exacerbation: Secondary | ICD-10-CM | POA: Diagnosis not present

## 2015-03-04 DIAGNOSIS — I10 Essential (primary) hypertension: Secondary | ICD-10-CM | POA: Diagnosis not present

## 2015-03-04 DIAGNOSIS — E271 Primary adrenocortical insufficiency: Secondary | ICD-10-CM | POA: Diagnosis not present

## 2015-03-04 DIAGNOSIS — Z7901 Long term (current) use of anticoagulants: Secondary | ICD-10-CM | POA: Diagnosis not present

## 2015-03-04 DIAGNOSIS — Z87891 Personal history of nicotine dependence: Secondary | ICD-10-CM | POA: Diagnosis not present

## 2015-03-04 DIAGNOSIS — J9611 Chronic respiratory failure with hypoxia: Secondary | ICD-10-CM | POA: Diagnosis not present

## 2015-03-04 DIAGNOSIS — M4806 Spinal stenosis, lumbar region: Secondary | ICD-10-CM | POA: Diagnosis not present

## 2015-03-04 DIAGNOSIS — M545 Low back pain: Secondary | ICD-10-CM | POA: Diagnosis not present

## 2015-03-04 DIAGNOSIS — J449 Chronic obstructive pulmonary disease, unspecified: Secondary | ICD-10-CM | POA: Diagnosis not present

## 2015-03-05 DIAGNOSIS — J441 Chronic obstructive pulmonary disease with (acute) exacerbation: Secondary | ICD-10-CM | POA: Diagnosis not present

## 2015-03-05 DIAGNOSIS — Z87891 Personal history of nicotine dependence: Secondary | ICD-10-CM | POA: Diagnosis not present

## 2015-03-05 DIAGNOSIS — M4806 Spinal stenosis, lumbar region: Secondary | ICD-10-CM | POA: Diagnosis not present

## 2015-03-05 DIAGNOSIS — Z7901 Long term (current) use of anticoagulants: Secondary | ICD-10-CM | POA: Diagnosis not present

## 2015-03-05 DIAGNOSIS — R600 Localized edema: Secondary | ICD-10-CM | POA: Diagnosis not present

## 2015-03-05 DIAGNOSIS — J9610 Chronic respiratory failure, unspecified whether with hypoxia or hypercapnia: Secondary | ICD-10-CM | POA: Diagnosis not present

## 2015-03-05 DIAGNOSIS — I10 Essential (primary) hypertension: Secondary | ICD-10-CM | POA: Diagnosis not present

## 2015-03-05 DIAGNOSIS — E271 Primary adrenocortical insufficiency: Secondary | ICD-10-CM | POA: Diagnosis not present

## 2015-03-05 DIAGNOSIS — Z23 Encounter for immunization: Secondary | ICD-10-CM | POA: Diagnosis not present

## 2015-03-05 DIAGNOSIS — Z86718 Personal history of other venous thrombosis and embolism: Secondary | ICD-10-CM | POA: Diagnosis not present

## 2015-03-06 DIAGNOSIS — J441 Chronic obstructive pulmonary disease with (acute) exacerbation: Secondary | ICD-10-CM | POA: Diagnosis not present

## 2015-03-06 DIAGNOSIS — E271 Primary adrenocortical insufficiency: Secondary | ICD-10-CM | POA: Diagnosis not present

## 2015-03-06 DIAGNOSIS — M4806 Spinal stenosis, lumbar region: Secondary | ICD-10-CM | POA: Diagnosis not present

## 2015-03-06 DIAGNOSIS — Z87891 Personal history of nicotine dependence: Secondary | ICD-10-CM | POA: Diagnosis not present

## 2015-03-06 DIAGNOSIS — I10 Essential (primary) hypertension: Secondary | ICD-10-CM | POA: Diagnosis not present

## 2015-03-06 DIAGNOSIS — Z86718 Personal history of other venous thrombosis and embolism: Secondary | ICD-10-CM | POA: Diagnosis not present

## 2015-03-06 DIAGNOSIS — Z7901 Long term (current) use of anticoagulants: Secondary | ICD-10-CM | POA: Diagnosis not present

## 2015-03-07 DIAGNOSIS — I872 Venous insufficiency (chronic) (peripheral): Secondary | ICD-10-CM | POA: Diagnosis not present

## 2015-03-09 DIAGNOSIS — E039 Hypothyroidism, unspecified: Secondary | ICD-10-CM | POA: Diagnosis not present

## 2015-03-10 DIAGNOSIS — Z7901 Long term (current) use of anticoagulants: Secondary | ICD-10-CM | POA: Diagnosis not present

## 2015-03-10 DIAGNOSIS — J441 Chronic obstructive pulmonary disease with (acute) exacerbation: Secondary | ICD-10-CM | POA: Diagnosis not present

## 2015-03-10 DIAGNOSIS — Z86718 Personal history of other venous thrombosis and embolism: Secondary | ICD-10-CM | POA: Diagnosis not present

## 2015-03-10 DIAGNOSIS — M4806 Spinal stenosis, lumbar region: Secondary | ICD-10-CM | POA: Diagnosis not present

## 2015-03-10 DIAGNOSIS — Z87891 Personal history of nicotine dependence: Secondary | ICD-10-CM | POA: Diagnosis not present

## 2015-03-10 DIAGNOSIS — I10 Essential (primary) hypertension: Secondary | ICD-10-CM | POA: Diagnosis not present

## 2015-03-10 DIAGNOSIS — E271 Primary adrenocortical insufficiency: Secondary | ICD-10-CM | POA: Diagnosis not present

## 2015-03-11 DIAGNOSIS — Z7901 Long term (current) use of anticoagulants: Secondary | ICD-10-CM | POA: Diagnosis not present

## 2015-03-11 DIAGNOSIS — J449 Chronic obstructive pulmonary disease, unspecified: Secondary | ICD-10-CM | POA: Diagnosis not present

## 2015-03-11 DIAGNOSIS — Z87891 Personal history of nicotine dependence: Secondary | ICD-10-CM | POA: Diagnosis not present

## 2015-03-11 DIAGNOSIS — J441 Chronic obstructive pulmonary disease with (acute) exacerbation: Secondary | ICD-10-CM | POA: Diagnosis not present

## 2015-03-11 DIAGNOSIS — M4806 Spinal stenosis, lumbar region: Secondary | ICD-10-CM | POA: Diagnosis not present

## 2015-03-11 DIAGNOSIS — G4730 Sleep apnea, unspecified: Secondary | ICD-10-CM | POA: Diagnosis not present

## 2015-03-11 DIAGNOSIS — J9611 Chronic respiratory failure with hypoxia: Secondary | ICD-10-CM | POA: Diagnosis not present

## 2015-03-11 DIAGNOSIS — I10 Essential (primary) hypertension: Secondary | ICD-10-CM | POA: Diagnosis not present

## 2015-03-11 DIAGNOSIS — Z86718 Personal history of other venous thrombosis and embolism: Secondary | ICD-10-CM | POA: Diagnosis not present

## 2015-03-11 DIAGNOSIS — E271 Primary adrenocortical insufficiency: Secondary | ICD-10-CM | POA: Diagnosis not present

## 2015-03-12 DIAGNOSIS — Z86718 Personal history of other venous thrombosis and embolism: Secondary | ICD-10-CM | POA: Diagnosis not present

## 2015-03-12 DIAGNOSIS — I10 Essential (primary) hypertension: Secondary | ICD-10-CM | POA: Diagnosis not present

## 2015-03-12 DIAGNOSIS — Z87891 Personal history of nicotine dependence: Secondary | ICD-10-CM | POA: Diagnosis not present

## 2015-03-12 DIAGNOSIS — M4806 Spinal stenosis, lumbar region: Secondary | ICD-10-CM | POA: Diagnosis not present

## 2015-03-12 DIAGNOSIS — Z7901 Long term (current) use of anticoagulants: Secondary | ICD-10-CM | POA: Diagnosis not present

## 2015-03-12 DIAGNOSIS — J441 Chronic obstructive pulmonary disease with (acute) exacerbation: Secondary | ICD-10-CM | POA: Diagnosis not present

## 2015-03-12 DIAGNOSIS — E271 Primary adrenocortical insufficiency: Secondary | ICD-10-CM | POA: Diagnosis not present

## 2015-03-13 DIAGNOSIS — I4891 Unspecified atrial fibrillation: Secondary | ICD-10-CM | POA: Diagnosis not present

## 2015-03-13 DIAGNOSIS — Z86718 Personal history of other venous thrombosis and embolism: Secondary | ICD-10-CM | POA: Diagnosis not present

## 2015-03-13 DIAGNOSIS — J42 Unspecified chronic bronchitis: Secondary | ICD-10-CM | POA: Diagnosis not present

## 2015-03-13 DIAGNOSIS — E271 Primary adrenocortical insufficiency: Secondary | ICD-10-CM | POA: Diagnosis not present

## 2015-03-13 DIAGNOSIS — R0602 Shortness of breath: Secondary | ICD-10-CM | POA: Diagnosis not present

## 2015-03-13 DIAGNOSIS — M4806 Spinal stenosis, lumbar region: Secondary | ICD-10-CM | POA: Diagnosis not present

## 2015-03-13 DIAGNOSIS — I10 Essential (primary) hypertension: Secondary | ICD-10-CM | POA: Diagnosis not present

## 2015-03-13 DIAGNOSIS — Z7901 Long term (current) use of anticoagulants: Secondary | ICD-10-CM | POA: Diagnosis not present

## 2015-03-13 DIAGNOSIS — I480 Paroxysmal atrial fibrillation: Secondary | ICD-10-CM | POA: Diagnosis not present

## 2015-03-13 DIAGNOSIS — J441 Chronic obstructive pulmonary disease with (acute) exacerbation: Secondary | ICD-10-CM | POA: Diagnosis not present

## 2015-03-13 DIAGNOSIS — Z87891 Personal history of nicotine dependence: Secondary | ICD-10-CM | POA: Diagnosis not present

## 2015-03-17 DIAGNOSIS — Z86718 Personal history of other venous thrombosis and embolism: Secondary | ICD-10-CM | POA: Diagnosis not present

## 2015-03-17 DIAGNOSIS — J441 Chronic obstructive pulmonary disease with (acute) exacerbation: Secondary | ICD-10-CM | POA: Diagnosis not present

## 2015-03-17 DIAGNOSIS — Z87891 Personal history of nicotine dependence: Secondary | ICD-10-CM | POA: Diagnosis not present

## 2015-03-17 DIAGNOSIS — I10 Essential (primary) hypertension: Secondary | ICD-10-CM | POA: Diagnosis not present

## 2015-03-17 DIAGNOSIS — Z7901 Long term (current) use of anticoagulants: Secondary | ICD-10-CM | POA: Diagnosis not present

## 2015-03-17 DIAGNOSIS — E271 Primary adrenocortical insufficiency: Secondary | ICD-10-CM | POA: Diagnosis not present

## 2015-03-17 DIAGNOSIS — M4806 Spinal stenosis, lumbar region: Secondary | ICD-10-CM | POA: Diagnosis not present

## 2015-03-18 DIAGNOSIS — Z87891 Personal history of nicotine dependence: Secondary | ICD-10-CM | POA: Diagnosis not present

## 2015-03-18 DIAGNOSIS — G4730 Sleep apnea, unspecified: Secondary | ICD-10-CM | POA: Diagnosis not present

## 2015-03-18 DIAGNOSIS — E271 Primary adrenocortical insufficiency: Secondary | ICD-10-CM | POA: Diagnosis not present

## 2015-03-18 DIAGNOSIS — J441 Chronic obstructive pulmonary disease with (acute) exacerbation: Secondary | ICD-10-CM | POA: Diagnosis not present

## 2015-03-18 DIAGNOSIS — R0689 Other abnormalities of breathing: Secondary | ICD-10-CM | POA: Diagnosis not present

## 2015-03-18 DIAGNOSIS — Z7901 Long term (current) use of anticoagulants: Secondary | ICD-10-CM | POA: Diagnosis not present

## 2015-03-18 DIAGNOSIS — M4806 Spinal stenosis, lumbar region: Secondary | ICD-10-CM | POA: Diagnosis not present

## 2015-03-18 DIAGNOSIS — Z86718 Personal history of other venous thrombosis and embolism: Secondary | ICD-10-CM | POA: Diagnosis not present

## 2015-03-18 DIAGNOSIS — I10 Essential (primary) hypertension: Secondary | ICD-10-CM | POA: Diagnosis not present

## 2015-03-19 DIAGNOSIS — E271 Primary adrenocortical insufficiency: Secondary | ICD-10-CM | POA: Diagnosis not present

## 2015-03-19 DIAGNOSIS — M4806 Spinal stenosis, lumbar region: Secondary | ICD-10-CM | POA: Diagnosis not present

## 2015-03-19 DIAGNOSIS — Z86718 Personal history of other venous thrombosis and embolism: Secondary | ICD-10-CM | POA: Diagnosis not present

## 2015-03-19 DIAGNOSIS — J441 Chronic obstructive pulmonary disease with (acute) exacerbation: Secondary | ICD-10-CM | POA: Diagnosis not present

## 2015-03-19 DIAGNOSIS — Z87891 Personal history of nicotine dependence: Secondary | ICD-10-CM | POA: Diagnosis not present

## 2015-03-19 DIAGNOSIS — I10 Essential (primary) hypertension: Secondary | ICD-10-CM | POA: Diagnosis not present

## 2015-03-19 DIAGNOSIS — Z7901 Long term (current) use of anticoagulants: Secondary | ICD-10-CM | POA: Diagnosis not present

## 2015-03-20 DIAGNOSIS — R609 Edema, unspecified: Secondary | ICD-10-CM | POA: Diagnosis not present

## 2015-03-20 DIAGNOSIS — R531 Weakness: Secondary | ICD-10-CM | POA: Diagnosis not present

## 2015-03-20 DIAGNOSIS — M6281 Muscle weakness (generalized): Secondary | ICD-10-CM | POA: Diagnosis not present

## 2015-03-20 DIAGNOSIS — M4806 Spinal stenosis, lumbar region: Secondary | ICD-10-CM | POA: Diagnosis not present

## 2015-03-20 DIAGNOSIS — I10 Essential (primary) hypertension: Secondary | ICD-10-CM | POA: Diagnosis not present

## 2015-03-20 DIAGNOSIS — E271 Primary adrenocortical insufficiency: Secondary | ICD-10-CM | POA: Diagnosis not present

## 2015-03-20 DIAGNOSIS — M47816 Spondylosis without myelopathy or radiculopathy, lumbar region: Secondary | ICD-10-CM | POA: Diagnosis not present

## 2015-03-20 DIAGNOSIS — M4800 Spinal stenosis, site unspecified: Secondary | ICD-10-CM | POA: Diagnosis not present

## 2015-03-20 DIAGNOSIS — G4700 Insomnia, unspecified: Secondary | ICD-10-CM | POA: Diagnosis not present

## 2015-03-20 DIAGNOSIS — Z7901 Long term (current) use of anticoagulants: Secondary | ICD-10-CM | POA: Diagnosis not present

## 2015-03-20 DIAGNOSIS — E785 Hyperlipidemia, unspecified: Secondary | ICD-10-CM | POA: Diagnosis not present

## 2015-03-20 DIAGNOSIS — K219 Gastro-esophageal reflux disease without esophagitis: Secondary | ICD-10-CM | POA: Diagnosis not present

## 2015-03-20 DIAGNOSIS — J441 Chronic obstructive pulmonary disease with (acute) exacerbation: Secondary | ICD-10-CM | POA: Diagnosis not present

## 2015-03-20 DIAGNOSIS — I82593 Chronic embolism and thrombosis of other specified deep vein of lower extremity, bilateral: Secondary | ICD-10-CM | POA: Diagnosis not present

## 2015-03-20 DIAGNOSIS — Z87891 Personal history of nicotine dependence: Secondary | ICD-10-CM | POA: Diagnosis not present

## 2015-03-20 DIAGNOSIS — G8929 Other chronic pain: Secondary | ICD-10-CM | POA: Diagnosis not present

## 2015-03-20 DIAGNOSIS — I5022 Chronic systolic (congestive) heart failure: Secondary | ICD-10-CM | POA: Diagnosis not present

## 2015-03-20 DIAGNOSIS — Z86718 Personal history of other venous thrombosis and embolism: Secondary | ICD-10-CM | POA: Diagnosis not present

## 2015-03-20 DIAGNOSIS — J449 Chronic obstructive pulmonary disease, unspecified: Secondary | ICD-10-CM | POA: Diagnosis not present

## 2015-03-20 DIAGNOSIS — R2689 Other abnormalities of gait and mobility: Secondary | ICD-10-CM | POA: Diagnosis not present

## 2015-03-20 DIAGNOSIS — I4891 Unspecified atrial fibrillation: Secondary | ICD-10-CM | POA: Diagnosis not present

## 2015-03-23 DIAGNOSIS — M47816 Spondylosis without myelopathy or radiculopathy, lumbar region: Secondary | ICD-10-CM | POA: Diagnosis not present

## 2015-03-23 DIAGNOSIS — M4806 Spinal stenosis, lumbar region: Secondary | ICD-10-CM | POA: Diagnosis not present

## 2015-04-14 DIAGNOSIS — J441 Chronic obstructive pulmonary disease with (acute) exacerbation: Secondary | ICD-10-CM | POA: Diagnosis not present

## 2015-04-14 DIAGNOSIS — I4891 Unspecified atrial fibrillation: Secondary | ICD-10-CM | POA: Diagnosis not present

## 2015-04-14 DIAGNOSIS — M4800 Spinal stenosis, site unspecified: Secondary | ICD-10-CM | POA: Diagnosis not present

## 2015-04-14 DIAGNOSIS — I110 Hypertensive heart disease with heart failure: Secondary | ICD-10-CM | POA: Diagnosis not present

## 2015-04-14 DIAGNOSIS — R260 Ataxic gait: Secondary | ICD-10-CM | POA: Diagnosis not present

## 2015-04-14 DIAGNOSIS — E785 Hyperlipidemia, unspecified: Secondary | ICD-10-CM | POA: Diagnosis not present

## 2015-04-14 DIAGNOSIS — M25552 Pain in left hip: Secondary | ICD-10-CM | POA: Diagnosis not present

## 2015-04-14 DIAGNOSIS — E271 Primary adrenocortical insufficiency: Secondary | ICD-10-CM | POA: Diagnosis not present

## 2015-04-14 DIAGNOSIS — Z86718 Personal history of other venous thrombosis and embolism: Secondary | ICD-10-CM | POA: Diagnosis not present

## 2015-04-14 DIAGNOSIS — I509 Heart failure, unspecified: Secondary | ICD-10-CM | POA: Diagnosis not present

## 2015-04-14 DIAGNOSIS — I2510 Atherosclerotic heart disease of native coronary artery without angina pectoris: Secondary | ICD-10-CM | POA: Diagnosis not present

## 2015-04-15 DIAGNOSIS — E291 Testicular hypofunction: Secondary | ICD-10-CM | POA: Diagnosis not present

## 2015-04-15 DIAGNOSIS — G4730 Sleep apnea, unspecified: Secondary | ICD-10-CM | POA: Diagnosis not present

## 2015-04-15 DIAGNOSIS — Z1389 Encounter for screening for other disorder: Secondary | ICD-10-CM | POA: Diagnosis not present

## 2015-04-15 DIAGNOSIS — M1991 Primary osteoarthritis, unspecified site: Secondary | ICD-10-CM | POA: Diagnosis not present

## 2015-04-15 DIAGNOSIS — J9610 Chronic respiratory failure, unspecified whether with hypoxia or hypercapnia: Secondary | ICD-10-CM | POA: Diagnosis not present

## 2015-04-15 DIAGNOSIS — G894 Chronic pain syndrome: Secondary | ICD-10-CM | POA: Diagnosis not present

## 2015-04-15 DIAGNOSIS — E272 Addisonian crisis: Secondary | ICD-10-CM | POA: Diagnosis not present

## 2015-04-15 DIAGNOSIS — R5383 Other fatigue: Secondary | ICD-10-CM | POA: Diagnosis not present

## 2015-04-16 DIAGNOSIS — Z86718 Personal history of other venous thrombosis and embolism: Secondary | ICD-10-CM | POA: Diagnosis not present

## 2015-04-16 DIAGNOSIS — E271 Primary adrenocortical insufficiency: Secondary | ICD-10-CM | POA: Diagnosis not present

## 2015-04-16 DIAGNOSIS — M4800 Spinal stenosis, site unspecified: Secondary | ICD-10-CM | POA: Diagnosis not present

## 2015-04-16 DIAGNOSIS — I2510 Atherosclerotic heart disease of native coronary artery without angina pectoris: Secondary | ICD-10-CM | POA: Diagnosis not present

## 2015-04-16 DIAGNOSIS — I110 Hypertensive heart disease with heart failure: Secondary | ICD-10-CM | POA: Diagnosis not present

## 2015-04-16 DIAGNOSIS — I4891 Unspecified atrial fibrillation: Secondary | ICD-10-CM | POA: Diagnosis not present

## 2015-04-16 DIAGNOSIS — E785 Hyperlipidemia, unspecified: Secondary | ICD-10-CM | POA: Diagnosis not present

## 2015-04-16 DIAGNOSIS — I509 Heart failure, unspecified: Secondary | ICD-10-CM | POA: Diagnosis not present

## 2015-04-16 DIAGNOSIS — M25552 Pain in left hip: Secondary | ICD-10-CM | POA: Diagnosis not present

## 2015-04-16 DIAGNOSIS — R260 Ataxic gait: Secondary | ICD-10-CM | POA: Diagnosis not present

## 2015-04-16 DIAGNOSIS — J441 Chronic obstructive pulmonary disease with (acute) exacerbation: Secondary | ICD-10-CM | POA: Diagnosis not present

## 2015-04-17 DIAGNOSIS — M4800 Spinal stenosis, site unspecified: Secondary | ICD-10-CM | POA: Diagnosis not present

## 2015-04-17 DIAGNOSIS — M25552 Pain in left hip: Secondary | ICD-10-CM | POA: Diagnosis not present

## 2015-04-17 DIAGNOSIS — I4891 Unspecified atrial fibrillation: Secondary | ICD-10-CM | POA: Diagnosis not present

## 2015-04-17 DIAGNOSIS — Z86718 Personal history of other venous thrombosis and embolism: Secondary | ICD-10-CM | POA: Diagnosis not present

## 2015-04-17 DIAGNOSIS — I2510 Atherosclerotic heart disease of native coronary artery without angina pectoris: Secondary | ICD-10-CM | POA: Diagnosis not present

## 2015-04-17 DIAGNOSIS — R260 Ataxic gait: Secondary | ICD-10-CM | POA: Diagnosis not present

## 2015-04-17 DIAGNOSIS — E271 Primary adrenocortical insufficiency: Secondary | ICD-10-CM | POA: Diagnosis not present

## 2015-04-17 DIAGNOSIS — I509 Heart failure, unspecified: Secondary | ICD-10-CM | POA: Diagnosis not present

## 2015-04-17 DIAGNOSIS — E785 Hyperlipidemia, unspecified: Secondary | ICD-10-CM | POA: Diagnosis not present

## 2015-04-17 DIAGNOSIS — J441 Chronic obstructive pulmonary disease with (acute) exacerbation: Secondary | ICD-10-CM | POA: Diagnosis not present

## 2015-04-17 DIAGNOSIS — I110 Hypertensive heart disease with heart failure: Secondary | ICD-10-CM | POA: Diagnosis not present

## 2015-04-20 ENCOUNTER — Other Ambulatory Visit (HOSPITAL_COMMUNITY): Payer: Self-pay | Admitting: Internal Medicine

## 2015-04-20 ENCOUNTER — Ambulatory Visit (HOSPITAL_COMMUNITY)
Admission: RE | Admit: 2015-04-20 | Discharge: 2015-04-20 | Disposition: A | Discharge status: Home / Self Care | Payer: Medicare Other | Source: Ambulatory Visit | Attending: Internal Medicine | Admitting: Internal Medicine

## 2015-04-20 DIAGNOSIS — I110 Hypertensive heart disease with heart failure: Secondary | ICD-10-CM | POA: Diagnosis not present

## 2015-04-20 DIAGNOSIS — Z1389 Encounter for screening for other disorder: Secondary | ICD-10-CM | POA: Insufficient documentation

## 2015-04-20 DIAGNOSIS — M25552 Pain in left hip: Secondary | ICD-10-CM | POA: Diagnosis not present

## 2015-04-20 DIAGNOSIS — Z86718 Personal history of other venous thrombosis and embolism: Secondary | ICD-10-CM | POA: Diagnosis not present

## 2015-04-20 DIAGNOSIS — R0600 Dyspnea, unspecified: Secondary | ICD-10-CM | POA: Insufficient documentation

## 2015-04-20 DIAGNOSIS — Z6841 Body Mass Index (BMI) 40.0 and over, adult: Secondary | ICD-10-CM | POA: Insufficient documentation

## 2015-04-20 DIAGNOSIS — I509 Heart failure, unspecified: Secondary | ICD-10-CM | POA: Diagnosis not present

## 2015-04-20 DIAGNOSIS — I2510 Atherosclerotic heart disease of native coronary artery without angina pectoris: Secondary | ICD-10-CM | POA: Diagnosis not present

## 2015-04-20 DIAGNOSIS — R260 Ataxic gait: Secondary | ICD-10-CM | POA: Diagnosis not present

## 2015-04-20 DIAGNOSIS — M4800 Spinal stenosis, site unspecified: Secondary | ICD-10-CM | POA: Diagnosis not present

## 2015-04-20 DIAGNOSIS — R0902 Hypoxemia: Secondary | ICD-10-CM

## 2015-04-20 DIAGNOSIS — E271 Primary adrenocortical insufficiency: Secondary | ICD-10-CM | POA: Diagnosis not present

## 2015-04-20 DIAGNOSIS — R0602 Shortness of breath: Secondary | ICD-10-CM | POA: Diagnosis not present

## 2015-04-20 DIAGNOSIS — E785 Hyperlipidemia, unspecified: Secondary | ICD-10-CM | POA: Diagnosis not present

## 2015-04-20 DIAGNOSIS — J441 Chronic obstructive pulmonary disease with (acute) exacerbation: Secondary | ICD-10-CM | POA: Diagnosis not present

## 2015-04-20 DIAGNOSIS — I4891 Unspecified atrial fibrillation: Secondary | ICD-10-CM | POA: Diagnosis not present

## 2015-04-21 DIAGNOSIS — R260 Ataxic gait: Secondary | ICD-10-CM | POA: Diagnosis not present

## 2015-04-21 DIAGNOSIS — I2510 Atherosclerotic heart disease of native coronary artery without angina pectoris: Secondary | ICD-10-CM | POA: Diagnosis not present

## 2015-04-21 DIAGNOSIS — E271 Primary adrenocortical insufficiency: Secondary | ICD-10-CM | POA: Diagnosis not present

## 2015-04-21 DIAGNOSIS — I509 Heart failure, unspecified: Secondary | ICD-10-CM | POA: Diagnosis not present

## 2015-04-21 DIAGNOSIS — Z86718 Personal history of other venous thrombosis and embolism: Secondary | ICD-10-CM | POA: Diagnosis not present

## 2015-04-21 DIAGNOSIS — I4891 Unspecified atrial fibrillation: Secondary | ICD-10-CM | POA: Diagnosis not present

## 2015-04-21 DIAGNOSIS — M25552 Pain in left hip: Secondary | ICD-10-CM | POA: Diagnosis not present

## 2015-04-21 DIAGNOSIS — I110 Hypertensive heart disease with heart failure: Secondary | ICD-10-CM | POA: Diagnosis not present

## 2015-04-21 DIAGNOSIS — J441 Chronic obstructive pulmonary disease with (acute) exacerbation: Secondary | ICD-10-CM | POA: Diagnosis not present

## 2015-04-21 DIAGNOSIS — M4800 Spinal stenosis, site unspecified: Secondary | ICD-10-CM | POA: Diagnosis not present

## 2015-04-21 DIAGNOSIS — E785 Hyperlipidemia, unspecified: Secondary | ICD-10-CM | POA: Diagnosis not present

## 2015-04-22 DIAGNOSIS — I2510 Atherosclerotic heart disease of native coronary artery without angina pectoris: Secondary | ICD-10-CM | POA: Diagnosis not present

## 2015-04-22 DIAGNOSIS — M25552 Pain in left hip: Secondary | ICD-10-CM | POA: Diagnosis not present

## 2015-04-22 DIAGNOSIS — I509 Heart failure, unspecified: Secondary | ICD-10-CM | POA: Diagnosis not present

## 2015-04-22 DIAGNOSIS — I4891 Unspecified atrial fibrillation: Secondary | ICD-10-CM | POA: Diagnosis not present

## 2015-04-22 DIAGNOSIS — R260 Ataxic gait: Secondary | ICD-10-CM | POA: Diagnosis not present

## 2015-04-22 DIAGNOSIS — E271 Primary adrenocortical insufficiency: Secondary | ICD-10-CM | POA: Diagnosis not present

## 2015-04-22 DIAGNOSIS — Z86718 Personal history of other venous thrombosis and embolism: Secondary | ICD-10-CM | POA: Diagnosis not present

## 2015-04-22 DIAGNOSIS — M4800 Spinal stenosis, site unspecified: Secondary | ICD-10-CM | POA: Diagnosis not present

## 2015-04-22 DIAGNOSIS — I110 Hypertensive heart disease with heart failure: Secondary | ICD-10-CM | POA: Diagnosis not present

## 2015-04-22 DIAGNOSIS — E785 Hyperlipidemia, unspecified: Secondary | ICD-10-CM | POA: Diagnosis not present

## 2015-04-22 DIAGNOSIS — J441 Chronic obstructive pulmonary disease with (acute) exacerbation: Secondary | ICD-10-CM | POA: Diagnosis not present

## 2015-04-23 DIAGNOSIS — E785 Hyperlipidemia, unspecified: Secondary | ICD-10-CM | POA: Diagnosis not present

## 2015-04-23 DIAGNOSIS — I2510 Atherosclerotic heart disease of native coronary artery without angina pectoris: Secondary | ICD-10-CM | POA: Diagnosis not present

## 2015-04-23 DIAGNOSIS — Z86718 Personal history of other venous thrombosis and embolism: Secondary | ICD-10-CM | POA: Diagnosis not present

## 2015-04-23 DIAGNOSIS — M25552 Pain in left hip: Secondary | ICD-10-CM | POA: Diagnosis not present

## 2015-04-23 DIAGNOSIS — J441 Chronic obstructive pulmonary disease with (acute) exacerbation: Secondary | ICD-10-CM | POA: Diagnosis not present

## 2015-04-23 DIAGNOSIS — I509 Heart failure, unspecified: Secondary | ICD-10-CM | POA: Diagnosis not present

## 2015-04-23 DIAGNOSIS — I110 Hypertensive heart disease with heart failure: Secondary | ICD-10-CM | POA: Diagnosis not present

## 2015-04-23 DIAGNOSIS — M4800 Spinal stenosis, site unspecified: Secondary | ICD-10-CM | POA: Diagnosis not present

## 2015-04-23 DIAGNOSIS — R260 Ataxic gait: Secondary | ICD-10-CM | POA: Diagnosis not present

## 2015-04-23 DIAGNOSIS — E271 Primary adrenocortical insufficiency: Secondary | ICD-10-CM | POA: Diagnosis not present

## 2015-04-23 DIAGNOSIS — I4891 Unspecified atrial fibrillation: Secondary | ICD-10-CM | POA: Diagnosis not present

## 2015-04-24 DIAGNOSIS — E271 Primary adrenocortical insufficiency: Secondary | ICD-10-CM | POA: Diagnosis not present

## 2015-04-24 DIAGNOSIS — E785 Hyperlipidemia, unspecified: Secondary | ICD-10-CM | POA: Diagnosis not present

## 2015-04-24 DIAGNOSIS — J441 Chronic obstructive pulmonary disease with (acute) exacerbation: Secondary | ICD-10-CM | POA: Diagnosis not present

## 2015-04-24 DIAGNOSIS — I509 Heart failure, unspecified: Secondary | ICD-10-CM | POA: Diagnosis not present

## 2015-04-24 DIAGNOSIS — R260 Ataxic gait: Secondary | ICD-10-CM | POA: Diagnosis not present

## 2015-04-24 DIAGNOSIS — I110 Hypertensive heart disease with heart failure: Secondary | ICD-10-CM | POA: Diagnosis not present

## 2015-04-24 DIAGNOSIS — Z86718 Personal history of other venous thrombosis and embolism: Secondary | ICD-10-CM | POA: Diagnosis not present

## 2015-04-24 DIAGNOSIS — I4891 Unspecified atrial fibrillation: Secondary | ICD-10-CM | POA: Diagnosis not present

## 2015-04-24 DIAGNOSIS — I2510 Atherosclerotic heart disease of native coronary artery without angina pectoris: Secondary | ICD-10-CM | POA: Diagnosis not present

## 2015-04-24 DIAGNOSIS — M25552 Pain in left hip: Secondary | ICD-10-CM | POA: Diagnosis not present

## 2015-04-24 DIAGNOSIS — M4800 Spinal stenosis, site unspecified: Secondary | ICD-10-CM | POA: Diagnosis not present

## 2015-04-27 DIAGNOSIS — E785 Hyperlipidemia, unspecified: Secondary | ICD-10-CM | POA: Diagnosis not present

## 2015-04-27 DIAGNOSIS — J449 Chronic obstructive pulmonary disease, unspecified: Secondary | ICD-10-CM | POA: Diagnosis not present

## 2015-04-27 DIAGNOSIS — I509 Heart failure, unspecified: Secondary | ICD-10-CM | POA: Diagnosis not present

## 2015-04-27 DIAGNOSIS — Z86718 Personal history of other venous thrombosis and embolism: Secondary | ICD-10-CM | POA: Diagnosis not present

## 2015-04-27 DIAGNOSIS — E271 Primary adrenocortical insufficiency: Secondary | ICD-10-CM | POA: Diagnosis not present

## 2015-04-27 DIAGNOSIS — J441 Chronic obstructive pulmonary disease with (acute) exacerbation: Secondary | ICD-10-CM | POA: Diagnosis not present

## 2015-04-27 DIAGNOSIS — I4891 Unspecified atrial fibrillation: Secondary | ICD-10-CM | POA: Diagnosis not present

## 2015-04-27 DIAGNOSIS — M4800 Spinal stenosis, site unspecified: Secondary | ICD-10-CM | POA: Diagnosis not present

## 2015-04-27 DIAGNOSIS — R260 Ataxic gait: Secondary | ICD-10-CM | POA: Diagnosis not present

## 2015-04-27 DIAGNOSIS — M25552 Pain in left hip: Secondary | ICD-10-CM | POA: Diagnosis not present

## 2015-04-27 DIAGNOSIS — I110 Hypertensive heart disease with heart failure: Secondary | ICD-10-CM | POA: Diagnosis not present

## 2015-04-27 DIAGNOSIS — I2510 Atherosclerotic heart disease of native coronary artery without angina pectoris: Secondary | ICD-10-CM | POA: Diagnosis not present

## 2015-04-28 DIAGNOSIS — M25552 Pain in left hip: Secondary | ICD-10-CM | POA: Diagnosis not present

## 2015-04-28 DIAGNOSIS — R260 Ataxic gait: Secondary | ICD-10-CM | POA: Diagnosis not present

## 2015-04-28 DIAGNOSIS — Z86718 Personal history of other venous thrombosis and embolism: Secondary | ICD-10-CM | POA: Diagnosis not present

## 2015-04-28 DIAGNOSIS — M4800 Spinal stenosis, site unspecified: Secondary | ICD-10-CM | POA: Diagnosis not present

## 2015-04-28 DIAGNOSIS — I2510 Atherosclerotic heart disease of native coronary artery without angina pectoris: Secondary | ICD-10-CM | POA: Diagnosis not present

## 2015-04-28 DIAGNOSIS — E271 Primary adrenocortical insufficiency: Secondary | ICD-10-CM | POA: Diagnosis not present

## 2015-04-28 DIAGNOSIS — E785 Hyperlipidemia, unspecified: Secondary | ICD-10-CM | POA: Diagnosis not present

## 2015-04-28 DIAGNOSIS — I110 Hypertensive heart disease with heart failure: Secondary | ICD-10-CM | POA: Diagnosis not present

## 2015-04-28 DIAGNOSIS — I4891 Unspecified atrial fibrillation: Secondary | ICD-10-CM | POA: Diagnosis not present

## 2015-04-28 DIAGNOSIS — I509 Heart failure, unspecified: Secondary | ICD-10-CM | POA: Diagnosis not present

## 2015-04-28 DIAGNOSIS — J441 Chronic obstructive pulmonary disease with (acute) exacerbation: Secondary | ICD-10-CM | POA: Diagnosis not present

## 2015-04-29 DIAGNOSIS — M25552 Pain in left hip: Secondary | ICD-10-CM | POA: Diagnosis not present

## 2015-04-29 DIAGNOSIS — M4800 Spinal stenosis, site unspecified: Secondary | ICD-10-CM | POA: Diagnosis not present

## 2015-04-29 DIAGNOSIS — E785 Hyperlipidemia, unspecified: Secondary | ICD-10-CM | POA: Diagnosis not present

## 2015-04-29 DIAGNOSIS — I2510 Atherosclerotic heart disease of native coronary artery without angina pectoris: Secondary | ICD-10-CM | POA: Diagnosis not present

## 2015-04-29 DIAGNOSIS — Z86718 Personal history of other venous thrombosis and embolism: Secondary | ICD-10-CM | POA: Diagnosis not present

## 2015-04-29 DIAGNOSIS — R260 Ataxic gait: Secondary | ICD-10-CM | POA: Diagnosis not present

## 2015-04-29 DIAGNOSIS — E271 Primary adrenocortical insufficiency: Secondary | ICD-10-CM | POA: Diagnosis not present

## 2015-04-29 DIAGNOSIS — I509 Heart failure, unspecified: Secondary | ICD-10-CM | POA: Diagnosis not present

## 2015-04-29 DIAGNOSIS — I4891 Unspecified atrial fibrillation: Secondary | ICD-10-CM | POA: Diagnosis not present

## 2015-04-29 DIAGNOSIS — I110 Hypertensive heart disease with heart failure: Secondary | ICD-10-CM | POA: Diagnosis not present

## 2015-04-29 DIAGNOSIS — J441 Chronic obstructive pulmonary disease with (acute) exacerbation: Secondary | ICD-10-CM | POA: Diagnosis not present

## 2015-04-30 DIAGNOSIS — G894 Chronic pain syndrome: Secondary | ICD-10-CM | POA: Diagnosis not present

## 2015-04-30 DIAGNOSIS — E271 Primary adrenocortical insufficiency: Secondary | ICD-10-CM | POA: Diagnosis not present

## 2015-04-30 DIAGNOSIS — J9611 Chronic respiratory failure with hypoxia: Secondary | ICD-10-CM | POA: Diagnosis not present

## 2015-05-01 DIAGNOSIS — I4891 Unspecified atrial fibrillation: Secondary | ICD-10-CM | POA: Diagnosis not present

## 2015-05-01 DIAGNOSIS — I2510 Atherosclerotic heart disease of native coronary artery without angina pectoris: Secondary | ICD-10-CM | POA: Diagnosis not present

## 2015-05-01 DIAGNOSIS — I509 Heart failure, unspecified: Secondary | ICD-10-CM | POA: Diagnosis not present

## 2015-05-01 DIAGNOSIS — R260 Ataxic gait: Secondary | ICD-10-CM | POA: Diagnosis not present

## 2015-05-01 DIAGNOSIS — E271 Primary adrenocortical insufficiency: Secondary | ICD-10-CM | POA: Diagnosis not present

## 2015-05-01 DIAGNOSIS — Z86718 Personal history of other venous thrombosis and embolism: Secondary | ICD-10-CM | POA: Diagnosis not present

## 2015-05-01 DIAGNOSIS — M4800 Spinal stenosis, site unspecified: Secondary | ICD-10-CM | POA: Diagnosis not present

## 2015-05-01 DIAGNOSIS — M25552 Pain in left hip: Secondary | ICD-10-CM | POA: Diagnosis not present

## 2015-05-01 DIAGNOSIS — E785 Hyperlipidemia, unspecified: Secondary | ICD-10-CM | POA: Diagnosis not present

## 2015-05-01 DIAGNOSIS — J441 Chronic obstructive pulmonary disease with (acute) exacerbation: Secondary | ICD-10-CM | POA: Diagnosis not present

## 2015-05-01 DIAGNOSIS — I110 Hypertensive heart disease with heart failure: Secondary | ICD-10-CM | POA: Diagnosis not present

## 2015-05-02 DIAGNOSIS — E271 Primary adrenocortical insufficiency: Secondary | ICD-10-CM | POA: Diagnosis not present

## 2015-05-02 DIAGNOSIS — R260 Ataxic gait: Secondary | ICD-10-CM | POA: Diagnosis not present

## 2015-05-02 DIAGNOSIS — J441 Chronic obstructive pulmonary disease with (acute) exacerbation: Secondary | ICD-10-CM | POA: Diagnosis not present

## 2015-05-02 DIAGNOSIS — E785 Hyperlipidemia, unspecified: Secondary | ICD-10-CM | POA: Diagnosis not present

## 2015-05-02 DIAGNOSIS — I110 Hypertensive heart disease with heart failure: Secondary | ICD-10-CM | POA: Diagnosis not present

## 2015-05-02 DIAGNOSIS — I4891 Unspecified atrial fibrillation: Secondary | ICD-10-CM | POA: Diagnosis not present

## 2015-05-02 DIAGNOSIS — I2510 Atherosclerotic heart disease of native coronary artery without angina pectoris: Secondary | ICD-10-CM | POA: Diagnosis not present

## 2015-05-02 DIAGNOSIS — Z86718 Personal history of other venous thrombosis and embolism: Secondary | ICD-10-CM | POA: Diagnosis not present

## 2015-05-02 DIAGNOSIS — M25552 Pain in left hip: Secondary | ICD-10-CM | POA: Diagnosis not present

## 2015-05-02 DIAGNOSIS — M4800 Spinal stenosis, site unspecified: Secondary | ICD-10-CM | POA: Diagnosis not present

## 2015-05-02 DIAGNOSIS — I509 Heart failure, unspecified: Secondary | ICD-10-CM | POA: Diagnosis not present

## 2015-05-04 DIAGNOSIS — I509 Heart failure, unspecified: Secondary | ICD-10-CM | POA: Diagnosis not present

## 2015-05-04 DIAGNOSIS — E785 Hyperlipidemia, unspecified: Secondary | ICD-10-CM | POA: Diagnosis not present

## 2015-05-04 DIAGNOSIS — E271 Primary adrenocortical insufficiency: Secondary | ICD-10-CM | POA: Diagnosis not present

## 2015-05-04 DIAGNOSIS — M4800 Spinal stenosis, site unspecified: Secondary | ICD-10-CM | POA: Diagnosis not present

## 2015-05-04 DIAGNOSIS — J441 Chronic obstructive pulmonary disease with (acute) exacerbation: Secondary | ICD-10-CM | POA: Diagnosis not present

## 2015-05-04 DIAGNOSIS — I110 Hypertensive heart disease with heart failure: Secondary | ICD-10-CM | POA: Diagnosis not present

## 2015-05-04 DIAGNOSIS — Z86718 Personal history of other venous thrombosis and embolism: Secondary | ICD-10-CM | POA: Diagnosis not present

## 2015-05-04 DIAGNOSIS — I2510 Atherosclerotic heart disease of native coronary artery without angina pectoris: Secondary | ICD-10-CM | POA: Diagnosis not present

## 2015-05-04 DIAGNOSIS — R260 Ataxic gait: Secondary | ICD-10-CM | POA: Diagnosis not present

## 2015-05-04 DIAGNOSIS — M25552 Pain in left hip: Secondary | ICD-10-CM | POA: Diagnosis not present

## 2015-05-04 DIAGNOSIS — I4891 Unspecified atrial fibrillation: Secondary | ICD-10-CM | POA: Diagnosis not present

## 2015-05-05 DIAGNOSIS — I509 Heart failure, unspecified: Secondary | ICD-10-CM | POA: Diagnosis not present

## 2015-05-05 DIAGNOSIS — J441 Chronic obstructive pulmonary disease with (acute) exacerbation: Secondary | ICD-10-CM | POA: Diagnosis not present

## 2015-05-05 DIAGNOSIS — E271 Primary adrenocortical insufficiency: Secondary | ICD-10-CM | POA: Diagnosis not present

## 2015-05-05 DIAGNOSIS — I2510 Atherosclerotic heart disease of native coronary artery without angina pectoris: Secondary | ICD-10-CM | POA: Diagnosis not present

## 2015-05-05 DIAGNOSIS — I110 Hypertensive heart disease with heart failure: Secondary | ICD-10-CM | POA: Diagnosis not present

## 2015-05-05 DIAGNOSIS — Z86718 Personal history of other venous thrombosis and embolism: Secondary | ICD-10-CM | POA: Diagnosis not present

## 2015-05-05 DIAGNOSIS — E785 Hyperlipidemia, unspecified: Secondary | ICD-10-CM | POA: Diagnosis not present

## 2015-05-05 DIAGNOSIS — M25552 Pain in left hip: Secondary | ICD-10-CM | POA: Diagnosis not present

## 2015-05-05 DIAGNOSIS — R260 Ataxic gait: Secondary | ICD-10-CM | POA: Diagnosis not present

## 2015-05-05 DIAGNOSIS — M4800 Spinal stenosis, site unspecified: Secondary | ICD-10-CM | POA: Diagnosis not present

## 2015-05-05 DIAGNOSIS — I4891 Unspecified atrial fibrillation: Secondary | ICD-10-CM | POA: Diagnosis not present

## 2015-05-06 DIAGNOSIS — R260 Ataxic gait: Secondary | ICD-10-CM | POA: Diagnosis not present

## 2015-05-06 DIAGNOSIS — I509 Heart failure, unspecified: Secondary | ICD-10-CM | POA: Diagnosis not present

## 2015-05-06 DIAGNOSIS — I4891 Unspecified atrial fibrillation: Secondary | ICD-10-CM | POA: Diagnosis not present

## 2015-05-06 DIAGNOSIS — J441 Chronic obstructive pulmonary disease with (acute) exacerbation: Secondary | ICD-10-CM | POA: Diagnosis not present

## 2015-05-06 DIAGNOSIS — I2510 Atherosclerotic heart disease of native coronary artery without angina pectoris: Secondary | ICD-10-CM | POA: Diagnosis not present

## 2015-05-06 DIAGNOSIS — I110 Hypertensive heart disease with heart failure: Secondary | ICD-10-CM | POA: Diagnosis not present

## 2015-05-06 DIAGNOSIS — M4800 Spinal stenosis, site unspecified: Secondary | ICD-10-CM | POA: Diagnosis not present

## 2015-05-06 DIAGNOSIS — E785 Hyperlipidemia, unspecified: Secondary | ICD-10-CM | POA: Diagnosis not present

## 2015-05-06 DIAGNOSIS — E271 Primary adrenocortical insufficiency: Secondary | ICD-10-CM | POA: Diagnosis not present

## 2015-05-06 DIAGNOSIS — M25552 Pain in left hip: Secondary | ICD-10-CM | POA: Diagnosis not present

## 2015-05-06 DIAGNOSIS — Z86718 Personal history of other venous thrombosis and embolism: Secondary | ICD-10-CM | POA: Diagnosis not present

## 2015-05-07 DIAGNOSIS — M4800 Spinal stenosis, site unspecified: Secondary | ICD-10-CM | POA: Diagnosis not present

## 2015-05-07 DIAGNOSIS — I4891 Unspecified atrial fibrillation: Secondary | ICD-10-CM | POA: Diagnosis not present

## 2015-05-07 DIAGNOSIS — E785 Hyperlipidemia, unspecified: Secondary | ICD-10-CM | POA: Diagnosis not present

## 2015-05-07 DIAGNOSIS — I509 Heart failure, unspecified: Secondary | ICD-10-CM | POA: Diagnosis not present

## 2015-05-07 DIAGNOSIS — I2510 Atherosclerotic heart disease of native coronary artery without angina pectoris: Secondary | ICD-10-CM | POA: Diagnosis not present

## 2015-05-07 DIAGNOSIS — I110 Hypertensive heart disease with heart failure: Secondary | ICD-10-CM | POA: Diagnosis not present

## 2015-05-07 DIAGNOSIS — M25552 Pain in left hip: Secondary | ICD-10-CM | POA: Diagnosis not present

## 2015-05-07 DIAGNOSIS — Z86718 Personal history of other venous thrombosis and embolism: Secondary | ICD-10-CM | POA: Diagnosis not present

## 2015-05-07 DIAGNOSIS — J441 Chronic obstructive pulmonary disease with (acute) exacerbation: Secondary | ICD-10-CM | POA: Diagnosis not present

## 2015-05-07 DIAGNOSIS — R260 Ataxic gait: Secondary | ICD-10-CM | POA: Diagnosis not present

## 2015-05-07 DIAGNOSIS — E271 Primary adrenocortical insufficiency: Secondary | ICD-10-CM | POA: Diagnosis not present

## 2015-05-11 DIAGNOSIS — I4891 Unspecified atrial fibrillation: Secondary | ICD-10-CM | POA: Diagnosis not present

## 2015-05-11 DIAGNOSIS — Z86718 Personal history of other venous thrombosis and embolism: Secondary | ICD-10-CM | POA: Diagnosis not present

## 2015-05-11 DIAGNOSIS — E785 Hyperlipidemia, unspecified: Secondary | ICD-10-CM | POA: Diagnosis not present

## 2015-05-11 DIAGNOSIS — I509 Heart failure, unspecified: Secondary | ICD-10-CM | POA: Diagnosis not present

## 2015-05-11 DIAGNOSIS — E271 Primary adrenocortical insufficiency: Secondary | ICD-10-CM | POA: Diagnosis not present

## 2015-05-11 DIAGNOSIS — I2510 Atherosclerotic heart disease of native coronary artery without angina pectoris: Secondary | ICD-10-CM | POA: Diagnosis not present

## 2015-05-11 DIAGNOSIS — I110 Hypertensive heart disease with heart failure: Secondary | ICD-10-CM | POA: Diagnosis not present

## 2015-05-11 DIAGNOSIS — M25552 Pain in left hip: Secondary | ICD-10-CM | POA: Diagnosis not present

## 2015-05-11 DIAGNOSIS — M4800 Spinal stenosis, site unspecified: Secondary | ICD-10-CM | POA: Diagnosis not present

## 2015-05-11 DIAGNOSIS — J441 Chronic obstructive pulmonary disease with (acute) exacerbation: Secondary | ICD-10-CM | POA: Diagnosis not present

## 2015-05-11 DIAGNOSIS — R260 Ataxic gait: Secondary | ICD-10-CM | POA: Diagnosis not present

## 2015-05-12 DIAGNOSIS — M25552 Pain in left hip: Secondary | ICD-10-CM | POA: Diagnosis not present

## 2015-05-12 DIAGNOSIS — R260 Ataxic gait: Secondary | ICD-10-CM | POA: Diagnosis not present

## 2015-05-12 DIAGNOSIS — J441 Chronic obstructive pulmonary disease with (acute) exacerbation: Secondary | ICD-10-CM | POA: Diagnosis not present

## 2015-05-12 DIAGNOSIS — M4800 Spinal stenosis, site unspecified: Secondary | ICD-10-CM | POA: Diagnosis not present

## 2015-05-12 DIAGNOSIS — I110 Hypertensive heart disease with heart failure: Secondary | ICD-10-CM | POA: Diagnosis not present

## 2015-05-12 DIAGNOSIS — Z86718 Personal history of other venous thrombosis and embolism: Secondary | ICD-10-CM | POA: Diagnosis not present

## 2015-05-12 DIAGNOSIS — I509 Heart failure, unspecified: Secondary | ICD-10-CM | POA: Diagnosis not present

## 2015-05-12 DIAGNOSIS — E785 Hyperlipidemia, unspecified: Secondary | ICD-10-CM | POA: Diagnosis not present

## 2015-05-12 DIAGNOSIS — I4891 Unspecified atrial fibrillation: Secondary | ICD-10-CM | POA: Diagnosis not present

## 2015-05-12 DIAGNOSIS — I2510 Atherosclerotic heart disease of native coronary artery without angina pectoris: Secondary | ICD-10-CM | POA: Diagnosis not present

## 2015-05-12 DIAGNOSIS — E271 Primary adrenocortical insufficiency: Secondary | ICD-10-CM | POA: Diagnosis not present

## 2015-05-14 DIAGNOSIS — Z86718 Personal history of other venous thrombosis and embolism: Secondary | ICD-10-CM | POA: Diagnosis not present

## 2015-05-14 DIAGNOSIS — I509 Heart failure, unspecified: Secondary | ICD-10-CM | POA: Diagnosis not present

## 2015-05-14 DIAGNOSIS — M25552 Pain in left hip: Secondary | ICD-10-CM | POA: Diagnosis not present

## 2015-05-14 DIAGNOSIS — J449 Chronic obstructive pulmonary disease, unspecified: Secondary | ICD-10-CM | POA: Diagnosis not present

## 2015-05-14 DIAGNOSIS — J9611 Chronic respiratory failure with hypoxia: Secondary | ICD-10-CM | POA: Diagnosis not present

## 2015-05-14 DIAGNOSIS — E271 Primary adrenocortical insufficiency: Secondary | ICD-10-CM | POA: Diagnosis not present

## 2015-05-14 DIAGNOSIS — J441 Chronic obstructive pulmonary disease with (acute) exacerbation: Secondary | ICD-10-CM | POA: Diagnosis not present

## 2015-05-14 DIAGNOSIS — I2510 Atherosclerotic heart disease of native coronary artery without angina pectoris: Secondary | ICD-10-CM | POA: Diagnosis not present

## 2015-05-14 DIAGNOSIS — G4730 Sleep apnea, unspecified: Secondary | ICD-10-CM | POA: Diagnosis not present

## 2015-05-14 DIAGNOSIS — R260 Ataxic gait: Secondary | ICD-10-CM | POA: Diagnosis not present

## 2015-05-14 DIAGNOSIS — I110 Hypertensive heart disease with heart failure: Secondary | ICD-10-CM | POA: Diagnosis not present

## 2015-05-14 DIAGNOSIS — I4891 Unspecified atrial fibrillation: Secondary | ICD-10-CM | POA: Diagnosis not present

## 2015-05-14 DIAGNOSIS — E785 Hyperlipidemia, unspecified: Secondary | ICD-10-CM | POA: Diagnosis not present

## 2015-05-14 DIAGNOSIS — M4800 Spinal stenosis, site unspecified: Secondary | ICD-10-CM | POA: Diagnosis not present

## 2015-05-18 DIAGNOSIS — E785 Hyperlipidemia, unspecified: Secondary | ICD-10-CM | POA: Diagnosis not present

## 2015-05-18 DIAGNOSIS — E271 Primary adrenocortical insufficiency: Secondary | ICD-10-CM | POA: Diagnosis not present

## 2015-05-18 DIAGNOSIS — I509 Heart failure, unspecified: Secondary | ICD-10-CM | POA: Diagnosis not present

## 2015-05-18 DIAGNOSIS — R260 Ataxic gait: Secondary | ICD-10-CM | POA: Diagnosis not present

## 2015-05-18 DIAGNOSIS — I4891 Unspecified atrial fibrillation: Secondary | ICD-10-CM | POA: Diagnosis not present

## 2015-05-18 DIAGNOSIS — M25552 Pain in left hip: Secondary | ICD-10-CM | POA: Diagnosis not present

## 2015-05-18 DIAGNOSIS — I110 Hypertensive heart disease with heart failure: Secondary | ICD-10-CM | POA: Diagnosis not present

## 2015-05-18 DIAGNOSIS — E291 Testicular hypofunction: Secondary | ICD-10-CM | POA: Diagnosis not present

## 2015-05-18 DIAGNOSIS — Z86718 Personal history of other venous thrombosis and embolism: Secondary | ICD-10-CM | POA: Diagnosis not present

## 2015-05-18 DIAGNOSIS — J441 Chronic obstructive pulmonary disease with (acute) exacerbation: Secondary | ICD-10-CM | POA: Diagnosis not present

## 2015-05-18 DIAGNOSIS — I2510 Atherosclerotic heart disease of native coronary artery without angina pectoris: Secondary | ICD-10-CM | POA: Diagnosis not present

## 2015-05-18 DIAGNOSIS — M4800 Spinal stenosis, site unspecified: Secondary | ICD-10-CM | POA: Diagnosis not present

## 2015-05-20 ENCOUNTER — Ambulatory Visit: Payer: Medicare Other | Admitting: Orthopaedic Surgery

## 2015-05-20 DIAGNOSIS — Z86718 Personal history of other venous thrombosis and embolism: Secondary | ICD-10-CM | POA: Diagnosis not present

## 2015-05-20 DIAGNOSIS — I509 Heart failure, unspecified: Secondary | ICD-10-CM | POA: Diagnosis not present

## 2015-05-20 DIAGNOSIS — M25552 Pain in left hip: Secondary | ICD-10-CM | POA: Diagnosis not present

## 2015-05-20 DIAGNOSIS — R260 Ataxic gait: Secondary | ICD-10-CM | POA: Diagnosis not present

## 2015-05-20 DIAGNOSIS — E785 Hyperlipidemia, unspecified: Secondary | ICD-10-CM | POA: Diagnosis not present

## 2015-05-20 DIAGNOSIS — E271 Primary adrenocortical insufficiency: Secondary | ICD-10-CM | POA: Diagnosis not present

## 2015-05-20 DIAGNOSIS — I110 Hypertensive heart disease with heart failure: Secondary | ICD-10-CM | POA: Diagnosis not present

## 2015-05-20 DIAGNOSIS — J441 Chronic obstructive pulmonary disease with (acute) exacerbation: Secondary | ICD-10-CM | POA: Diagnosis not present

## 2015-05-20 DIAGNOSIS — M4800 Spinal stenosis, site unspecified: Secondary | ICD-10-CM | POA: Diagnosis not present

## 2015-05-20 DIAGNOSIS — I2510 Atherosclerotic heart disease of native coronary artery without angina pectoris: Secondary | ICD-10-CM | POA: Diagnosis not present

## 2015-05-20 DIAGNOSIS — I4891 Unspecified atrial fibrillation: Secondary | ICD-10-CM | POA: Diagnosis not present

## 2015-05-21 DIAGNOSIS — I4891 Unspecified atrial fibrillation: Secondary | ICD-10-CM | POA: Diagnosis not present

## 2015-05-21 DIAGNOSIS — R260 Ataxic gait: Secondary | ICD-10-CM | POA: Diagnosis not present

## 2015-05-21 DIAGNOSIS — J441 Chronic obstructive pulmonary disease with (acute) exacerbation: Secondary | ICD-10-CM | POA: Diagnosis not present

## 2015-05-21 DIAGNOSIS — E785 Hyperlipidemia, unspecified: Secondary | ICD-10-CM | POA: Diagnosis not present

## 2015-05-21 DIAGNOSIS — E273 Drug-induced adrenocortical insufficiency: Secondary | ICD-10-CM | POA: Diagnosis not present

## 2015-05-21 DIAGNOSIS — I110 Hypertensive heart disease with heart failure: Secondary | ICD-10-CM | POA: Diagnosis not present

## 2015-05-21 DIAGNOSIS — I509 Heart failure, unspecified: Secondary | ICD-10-CM | POA: Diagnosis not present

## 2015-05-21 DIAGNOSIS — I2510 Atherosclerotic heart disease of native coronary artery without angina pectoris: Secondary | ICD-10-CM | POA: Diagnosis not present

## 2015-05-21 DIAGNOSIS — Z86718 Personal history of other venous thrombosis and embolism: Secondary | ICD-10-CM | POA: Diagnosis not present

## 2015-05-21 DIAGNOSIS — E271 Primary adrenocortical insufficiency: Secondary | ICD-10-CM | POA: Diagnosis not present

## 2015-05-21 DIAGNOSIS — M25552 Pain in left hip: Secondary | ICD-10-CM | POA: Diagnosis not present

## 2015-05-21 DIAGNOSIS — E039 Hypothyroidism, unspecified: Secondary | ICD-10-CM | POA: Diagnosis not present

## 2015-05-21 DIAGNOSIS — M4800 Spinal stenosis, site unspecified: Secondary | ICD-10-CM | POA: Diagnosis not present

## 2015-05-22 ENCOUNTER — Other Ambulatory Visit (HOSPITAL_COMMUNITY): Payer: Self-pay | Admitting: Respiratory Therapy

## 2015-05-22 DIAGNOSIS — M4800 Spinal stenosis, site unspecified: Secondary | ICD-10-CM | POA: Diagnosis not present

## 2015-05-22 DIAGNOSIS — R260 Ataxic gait: Secondary | ICD-10-CM | POA: Diagnosis not present

## 2015-05-22 DIAGNOSIS — E785 Hyperlipidemia, unspecified: Secondary | ICD-10-CM | POA: Diagnosis not present

## 2015-05-22 DIAGNOSIS — I509 Heart failure, unspecified: Secondary | ICD-10-CM | POA: Diagnosis not present

## 2015-05-22 DIAGNOSIS — J441 Chronic obstructive pulmonary disease with (acute) exacerbation: Secondary | ICD-10-CM | POA: Diagnosis not present

## 2015-05-22 DIAGNOSIS — Z86718 Personal history of other venous thrombosis and embolism: Secondary | ICD-10-CM | POA: Diagnosis not present

## 2015-05-22 DIAGNOSIS — G4730 Sleep apnea, unspecified: Secondary | ICD-10-CM

## 2015-05-22 DIAGNOSIS — I2510 Atherosclerotic heart disease of native coronary artery without angina pectoris: Secondary | ICD-10-CM | POA: Diagnosis not present

## 2015-05-22 DIAGNOSIS — I110 Hypertensive heart disease with heart failure: Secondary | ICD-10-CM | POA: Diagnosis not present

## 2015-05-22 DIAGNOSIS — M25552 Pain in left hip: Secondary | ICD-10-CM | POA: Diagnosis not present

## 2015-05-22 DIAGNOSIS — I4891 Unspecified atrial fibrillation: Secondary | ICD-10-CM | POA: Diagnosis not present

## 2015-05-22 DIAGNOSIS — E271 Primary adrenocortical insufficiency: Secondary | ICD-10-CM | POA: Diagnosis not present

## 2015-05-25 DIAGNOSIS — Z7901 Long term (current) use of anticoagulants: Secondary | ICD-10-CM | POA: Diagnosis not present

## 2015-05-26 DIAGNOSIS — E271 Primary adrenocortical insufficiency: Secondary | ICD-10-CM | POA: Diagnosis not present

## 2015-05-26 DIAGNOSIS — I2510 Atherosclerotic heart disease of native coronary artery without angina pectoris: Secondary | ICD-10-CM | POA: Diagnosis not present

## 2015-05-26 DIAGNOSIS — J441 Chronic obstructive pulmonary disease with (acute) exacerbation: Secondary | ICD-10-CM | POA: Diagnosis not present

## 2015-05-26 DIAGNOSIS — E669 Obesity, unspecified: Secondary | ICD-10-CM | POA: Diagnosis not present

## 2015-05-26 DIAGNOSIS — R260 Ataxic gait: Secondary | ICD-10-CM | POA: Diagnosis not present

## 2015-05-26 DIAGNOSIS — I4891 Unspecified atrial fibrillation: Secondary | ICD-10-CM | POA: Diagnosis not present

## 2015-05-26 DIAGNOSIS — Z1389 Encounter for screening for other disorder: Secondary | ICD-10-CM | POA: Diagnosis not present

## 2015-05-26 DIAGNOSIS — M4800 Spinal stenosis, site unspecified: Secondary | ICD-10-CM | POA: Diagnosis not present

## 2015-05-26 DIAGNOSIS — E785 Hyperlipidemia, unspecified: Secondary | ICD-10-CM | POA: Diagnosis not present

## 2015-05-26 DIAGNOSIS — I110 Hypertensive heart disease with heart failure: Secondary | ICD-10-CM | POA: Diagnosis not present

## 2015-05-26 DIAGNOSIS — M25552 Pain in left hip: Secondary | ICD-10-CM | POA: Diagnosis not present

## 2015-05-26 DIAGNOSIS — G894 Chronic pain syndrome: Secondary | ICD-10-CM | POA: Diagnosis not present

## 2015-05-26 DIAGNOSIS — Z86718 Personal history of other venous thrombosis and embolism: Secondary | ICD-10-CM | POA: Diagnosis not present

## 2015-05-26 DIAGNOSIS — I509 Heart failure, unspecified: Secondary | ICD-10-CM | POA: Diagnosis not present

## 2015-05-27 ENCOUNTER — Encounter: Payer: Self-pay | Admitting: Orthopaedic Surgery

## 2015-05-27 ENCOUNTER — Ambulatory Visit (INDEPENDENT_AMBULATORY_CARE_PROVIDER_SITE_OTHER): Payer: Medicare Other | Admitting: Orthopaedic Surgery

## 2015-05-27 VITALS — BP 125/69 | HR 83 | Temp 98.1°F | Resp 20 | Ht 68.0 in

## 2015-05-27 DIAGNOSIS — E271 Primary adrenocortical insufficiency: Secondary | ICD-10-CM | POA: Diagnosis not present

## 2015-05-27 DIAGNOSIS — R0609 Other forms of dyspnea: Secondary | ICD-10-CM

## 2015-05-27 DIAGNOSIS — J9612 Chronic respiratory failure with hypercapnia: Secondary | ICD-10-CM

## 2015-05-27 DIAGNOSIS — I716 Thoracoabdominal aortic aneurysm, without rupture, unspecified: Secondary | ICD-10-CM

## 2015-05-27 DIAGNOSIS — D689 Coagulation defect, unspecified: Secondary | ICD-10-CM

## 2015-05-27 DIAGNOSIS — M7061 Trochanteric bursitis, right hip: Secondary | ICD-10-CM

## 2015-05-27 DIAGNOSIS — J9611 Chronic respiratory failure with hypoxia: Secondary | ICD-10-CM

## 2015-05-27 DIAGNOSIS — Z9114 Patient's other noncompliance with medication regimen: Secondary | ICD-10-CM

## 2015-05-27 DIAGNOSIS — R0902 Hypoxemia: Secondary | ICD-10-CM

## 2015-05-27 DIAGNOSIS — R0602 Shortness of breath: Secondary | ICD-10-CM

## 2015-05-27 NOTE — Progress Notes (Signed)
Subjective: my right hip hurts    Patient ID: [name], male    DOB: September 20, 1940, 75 y.o.   MRN: [id number]  Hip Pain  There was no injury mechanism. The pain is present in the right hip. The pain is at a severity of 5/10. The pain is moderate. The pain has been worsening since onset. Associated symptoms include an inability to bear weight. Pertinent negatives include no loss of motion, loss of sensation, muscle weakness, numbness or tingling. The symptoms are aggravated by weight bearing. He has tried ice, heat and immobilization for the symptoms. The treatment provided mild relief.   He has had right lateral hip pain over the last several weeks to a month getting worse.  He has no numbness.  He has had problems with bleeding and having too much Coumadin in his system and had bleeding in the area of the lateral hip on the right with resolving ecchymosis.  He has no direct trauma.  Review of Systems  Constitutional: Positive for activity change and fatigue.  HENT: Positive for congestion and sinus pressure.   Respiratory: Positive for cough and shortness of breath.        On supplemental oxygen  Cardiovascular: Positive for chest pain and leg swelling.  Endocrine: Positive for cold intolerance.  Musculoskeletal: Positive for myalgias and arthralgias.  Allergic/Immunologic: Positive for environmental allergies.  Neurological: Positive for weakness. Negative for tingling and numbness.   Past Medical History  Diagnosis Date  . Diabetes mellitus     type II  . Obesity   . Osteoarthritis   . Venous insufficiency     lower extremities  . Cholecystitis   . AAA (abdominal aortic aneurysm) ([place])     followed by Dr. Sherren Mocha Early  . Hyperlipidemia   . Clotting disorder ([place])     history of DVT  . Arthritis   . Lung nodules   . Aortic aneurysm without rupture ([place])   . Hypertension    Past Surgical History  Procedure Laterality Date  . Cholecystectomy    . Inguinal hernia repair     . Testicle surgery  2004    Dr. [name]  . Cataract extraction w/phaco  10/21/2010    Procedure: CATARACT EXTRACTION PHACO AND INTRAOCULAR LENS PLACEMENT (IOC);  Surgeon: Tonny Branch;  Location: [facility];  Service: Ophthalmology;  Laterality: Left;  CDE:16.80  . Yag laser application Right [id number]    Procedure: YAG LASER APPLICATION;  Surgeon: [name], MD;  Location: [facility];  Service: Ophthalmology;  Laterality: Right;      Social History   Social History  . Marital Status: Married    Spouse Name: [name]  . Number of Children: 2  . Years of Education: 12   Occupational History  .     Social History Main Topics  . Smoking status: Former Smoker    Types: Cigarettes    Quit date: 07/24/1999  . Smokeless tobacco: Never Used  . Alcohol Use: No  . Drug Use: No  . Sexual Activity: Not on file   Other Topics Concern  . Not on file   Social History Narrative   Patient is married Administrator, [employer]) and lives at home with his wife.   Patient has two children.   Patient is retired.   Patient has a high school education.   Patient is right-handed.   Patient drinks 4 cups of coffee and 2 cups of diet soda daily./   BP 125/69 mmHg  Pulse 83  Temp(Src) 98.1 F (36.7 C)  Resp 20  Ht 5\' 8"  (1.727 m)  Wt   Objective:   Physical Exam  Constitutional: He is oriented to person, place, and time. He appears well-developed and well-nourished.  HENT:  Head: Normocephalic and atraumatic.  Eyes: Conjunctivae and EOM are normal. Pupils are equal, round, and reactive to light.  Neck: Normal range of motion. Neck supple.  Cardiovascular: Normal rate, regular rhythm and intact distal pulses.   Pulmonary/Chest: Effort normal.  On supplemental oxygen  Abdominal: Soft.  Musculoskeletal: He exhibits tenderness (He has pain of the lateral right hip.  He has resolving ecchymosis.  ROM is decreased.).  Neurological: He is alert and oriented to person, place, and time. He has normal reflexes. No  cranial nerve deficit. He exhibits normal muscle tone. Coordination normal.  Skin: Skin is warm and dry.  Psychiatric: He has a normal mood and affect. His behavior is normal. Judgment and thought content normal.          Assessment & Plan:   Encounter Diagnoses  Name Primary?  . Trochanteric bursitis of right hip Yes  . Thoracoabdominal aortic aneurysm, without rupture ([place])   . Chronic respiratory failure with hypoxia and hypercapnia (HCC)   . Adrenal insufficiency (Addison's disease) ([place])   . Clotting disorder ([place])   . Noncompliance with CPAP treatment   . Breath shortness   . Hypoxemia   . Dyspnea on exertion    PROCEDURE NOTE:  The patient request injection, verbal consent was obtained.  The right trochanteric area of the hip was prepped appropriately after time out was performed.   Sterile technique was observed and injection of 1 cc of Depo-Medrol 40 mg with several cc's of plain xylocaine. Anesthesia was provided by ethyl chloride and a 20-gauge needle was used to inject the hip area. The injection was tolerated well.  A band aid dressing was applied.  The patient was advised to apply ice later today and tomorrow to the injection sight as needed.  Return in two weeks.  Use ice tonight  Call if any problem.

## 2015-05-28 DIAGNOSIS — E785 Hyperlipidemia, unspecified: Secondary | ICD-10-CM | POA: Diagnosis not present

## 2015-05-28 DIAGNOSIS — R260 Ataxic gait: Secondary | ICD-10-CM | POA: Diagnosis not present

## 2015-05-28 DIAGNOSIS — Z86718 Personal history of other venous thrombosis and embolism: Secondary | ICD-10-CM | POA: Diagnosis not present

## 2015-05-28 DIAGNOSIS — E271 Primary adrenocortical insufficiency: Secondary | ICD-10-CM | POA: Diagnosis not present

## 2015-05-28 DIAGNOSIS — I2510 Atherosclerotic heart disease of native coronary artery without angina pectoris: Secondary | ICD-10-CM | POA: Diagnosis not present

## 2015-05-28 DIAGNOSIS — J449 Chronic obstructive pulmonary disease, unspecified: Secondary | ICD-10-CM | POA: Diagnosis not present

## 2015-05-28 DIAGNOSIS — M25552 Pain in left hip: Secondary | ICD-10-CM | POA: Diagnosis not present

## 2015-05-28 DIAGNOSIS — I509 Heart failure, unspecified: Secondary | ICD-10-CM | POA: Diagnosis not present

## 2015-05-28 DIAGNOSIS — I110 Hypertensive heart disease with heart failure: Secondary | ICD-10-CM | POA: Diagnosis not present

## 2015-05-28 DIAGNOSIS — M4800 Spinal stenosis, site unspecified: Secondary | ICD-10-CM | POA: Diagnosis not present

## 2015-05-28 DIAGNOSIS — I4891 Unspecified atrial fibrillation: Secondary | ICD-10-CM | POA: Diagnosis not present

## 2015-05-28 DIAGNOSIS — J441 Chronic obstructive pulmonary disease with (acute) exacerbation: Secondary | ICD-10-CM | POA: Diagnosis not present

## 2015-05-29 DIAGNOSIS — E785 Hyperlipidemia, unspecified: Secondary | ICD-10-CM | POA: Diagnosis not present

## 2015-05-29 DIAGNOSIS — J441 Chronic obstructive pulmonary disease with (acute) exacerbation: Secondary | ICD-10-CM | POA: Diagnosis not present

## 2015-05-29 DIAGNOSIS — I2510 Atherosclerotic heart disease of native coronary artery without angina pectoris: Secondary | ICD-10-CM | POA: Diagnosis not present

## 2015-05-29 DIAGNOSIS — E271 Primary adrenocortical insufficiency: Secondary | ICD-10-CM | POA: Diagnosis not present

## 2015-05-29 DIAGNOSIS — I509 Heart failure, unspecified: Secondary | ICD-10-CM | POA: Diagnosis not present

## 2015-05-29 DIAGNOSIS — I4891 Unspecified atrial fibrillation: Secondary | ICD-10-CM | POA: Diagnosis not present

## 2015-05-29 DIAGNOSIS — I110 Hypertensive heart disease with heart failure: Secondary | ICD-10-CM | POA: Diagnosis not present

## 2015-05-29 DIAGNOSIS — R260 Ataxic gait: Secondary | ICD-10-CM | POA: Diagnosis not present

## 2015-05-29 DIAGNOSIS — M4800 Spinal stenosis, site unspecified: Secondary | ICD-10-CM | POA: Diagnosis not present

## 2015-05-29 DIAGNOSIS — Z86718 Personal history of other venous thrombosis and embolism: Secondary | ICD-10-CM | POA: Diagnosis not present

## 2015-05-29 DIAGNOSIS — M25552 Pain in left hip: Secondary | ICD-10-CM | POA: Diagnosis not present

## 2015-06-01 DIAGNOSIS — I509 Heart failure, unspecified: Secondary | ICD-10-CM | POA: Diagnosis not present

## 2015-06-01 DIAGNOSIS — Z86718 Personal history of other venous thrombosis and embolism: Secondary | ICD-10-CM | POA: Diagnosis not present

## 2015-06-01 DIAGNOSIS — E271 Primary adrenocortical insufficiency: Secondary | ICD-10-CM | POA: Diagnosis not present

## 2015-06-01 DIAGNOSIS — M25552 Pain in left hip: Secondary | ICD-10-CM | POA: Diagnosis not present

## 2015-06-01 DIAGNOSIS — M4800 Spinal stenosis, site unspecified: Secondary | ICD-10-CM | POA: Diagnosis not present

## 2015-06-01 DIAGNOSIS — E785 Hyperlipidemia, unspecified: Secondary | ICD-10-CM | POA: Diagnosis not present

## 2015-06-01 DIAGNOSIS — I110 Hypertensive heart disease with heart failure: Secondary | ICD-10-CM | POA: Diagnosis not present

## 2015-06-01 DIAGNOSIS — I4891 Unspecified atrial fibrillation: Secondary | ICD-10-CM | POA: Diagnosis not present

## 2015-06-01 DIAGNOSIS — J441 Chronic obstructive pulmonary disease with (acute) exacerbation: Secondary | ICD-10-CM | POA: Diagnosis not present

## 2015-06-01 DIAGNOSIS — I2510 Atherosclerotic heart disease of native coronary artery without angina pectoris: Secondary | ICD-10-CM | POA: Diagnosis not present

## 2015-06-01 DIAGNOSIS — R260 Ataxic gait: Secondary | ICD-10-CM | POA: Diagnosis not present

## 2015-06-02 DIAGNOSIS — E271 Primary adrenocortical insufficiency: Secondary | ICD-10-CM | POA: Diagnosis not present

## 2015-06-02 DIAGNOSIS — I110 Hypertensive heart disease with heart failure: Secondary | ICD-10-CM | POA: Diagnosis not present

## 2015-06-02 DIAGNOSIS — I2510 Atherosclerotic heart disease of native coronary artery without angina pectoris: Secondary | ICD-10-CM | POA: Diagnosis not present

## 2015-06-02 DIAGNOSIS — M25552 Pain in left hip: Secondary | ICD-10-CM | POA: Diagnosis not present

## 2015-06-02 DIAGNOSIS — M4800 Spinal stenosis, site unspecified: Secondary | ICD-10-CM | POA: Diagnosis not present

## 2015-06-02 DIAGNOSIS — Z86718 Personal history of other venous thrombosis and embolism: Secondary | ICD-10-CM | POA: Diagnosis not present

## 2015-06-02 DIAGNOSIS — E785 Hyperlipidemia, unspecified: Secondary | ICD-10-CM | POA: Diagnosis not present

## 2015-06-02 DIAGNOSIS — I4891 Unspecified atrial fibrillation: Secondary | ICD-10-CM | POA: Diagnosis not present

## 2015-06-02 DIAGNOSIS — R260 Ataxic gait: Secondary | ICD-10-CM | POA: Diagnosis not present

## 2015-06-02 DIAGNOSIS — J441 Chronic obstructive pulmonary disease with (acute) exacerbation: Secondary | ICD-10-CM | POA: Diagnosis not present

## 2015-06-02 DIAGNOSIS — I509 Heart failure, unspecified: Secondary | ICD-10-CM | POA: Diagnosis not present

## 2015-06-04 DIAGNOSIS — R260 Ataxic gait: Secondary | ICD-10-CM | POA: Diagnosis not present

## 2015-06-04 DIAGNOSIS — M4800 Spinal stenosis, site unspecified: Secondary | ICD-10-CM | POA: Diagnosis not present

## 2015-06-04 DIAGNOSIS — I2510 Atherosclerotic heart disease of native coronary artery without angina pectoris: Secondary | ICD-10-CM | POA: Diagnosis not present

## 2015-06-04 DIAGNOSIS — I110 Hypertensive heart disease with heart failure: Secondary | ICD-10-CM | POA: Diagnosis not present

## 2015-06-04 DIAGNOSIS — I4891 Unspecified atrial fibrillation: Secondary | ICD-10-CM | POA: Diagnosis not present

## 2015-06-04 DIAGNOSIS — E785 Hyperlipidemia, unspecified: Secondary | ICD-10-CM | POA: Diagnosis not present

## 2015-06-04 DIAGNOSIS — I509 Heart failure, unspecified: Secondary | ICD-10-CM | POA: Diagnosis not present

## 2015-06-04 DIAGNOSIS — J441 Chronic obstructive pulmonary disease with (acute) exacerbation: Secondary | ICD-10-CM | POA: Diagnosis not present

## 2015-06-04 DIAGNOSIS — M25552 Pain in left hip: Secondary | ICD-10-CM | POA: Diagnosis not present

## 2015-06-04 DIAGNOSIS — E271 Primary adrenocortical insufficiency: Secondary | ICD-10-CM | POA: Diagnosis not present

## 2015-06-04 DIAGNOSIS — Z86718 Personal history of other venous thrombosis and embolism: Secondary | ICD-10-CM | POA: Diagnosis not present

## 2015-06-08 DIAGNOSIS — J441 Chronic obstructive pulmonary disease with (acute) exacerbation: Secondary | ICD-10-CM | POA: Diagnosis not present

## 2015-06-08 DIAGNOSIS — R260 Ataxic gait: Secondary | ICD-10-CM | POA: Diagnosis not present

## 2015-06-08 DIAGNOSIS — M25552 Pain in left hip: Secondary | ICD-10-CM | POA: Diagnosis not present

## 2015-06-08 DIAGNOSIS — I2510 Atherosclerotic heart disease of native coronary artery without angina pectoris: Secondary | ICD-10-CM | POA: Diagnosis not present

## 2015-06-08 DIAGNOSIS — Z86718 Personal history of other venous thrombosis and embolism: Secondary | ICD-10-CM | POA: Diagnosis not present

## 2015-06-08 DIAGNOSIS — M4800 Spinal stenosis, site unspecified: Secondary | ICD-10-CM | POA: Diagnosis not present

## 2015-06-08 DIAGNOSIS — E785 Hyperlipidemia, unspecified: Secondary | ICD-10-CM | POA: Diagnosis not present

## 2015-06-08 DIAGNOSIS — I4891 Unspecified atrial fibrillation: Secondary | ICD-10-CM | POA: Diagnosis not present

## 2015-06-08 DIAGNOSIS — I110 Hypertensive heart disease with heart failure: Secondary | ICD-10-CM | POA: Diagnosis not present

## 2015-06-08 DIAGNOSIS — I509 Heart failure, unspecified: Secondary | ICD-10-CM | POA: Diagnosis not present

## 2015-06-08 DIAGNOSIS — E271 Primary adrenocortical insufficiency: Secondary | ICD-10-CM | POA: Diagnosis not present

## 2015-06-09 DIAGNOSIS — M25552 Pain in left hip: Secondary | ICD-10-CM | POA: Diagnosis not present

## 2015-06-09 DIAGNOSIS — I110 Hypertensive heart disease with heart failure: Secondary | ICD-10-CM | POA: Diagnosis not present

## 2015-06-09 DIAGNOSIS — J441 Chronic obstructive pulmonary disease with (acute) exacerbation: Secondary | ICD-10-CM | POA: Diagnosis not present

## 2015-06-09 DIAGNOSIS — M4800 Spinal stenosis, site unspecified: Secondary | ICD-10-CM | POA: Diagnosis not present

## 2015-06-09 DIAGNOSIS — E785 Hyperlipidemia, unspecified: Secondary | ICD-10-CM | POA: Diagnosis not present

## 2015-06-09 DIAGNOSIS — Z86718 Personal history of other venous thrombosis and embolism: Secondary | ICD-10-CM | POA: Diagnosis not present

## 2015-06-09 DIAGNOSIS — I4891 Unspecified atrial fibrillation: Secondary | ICD-10-CM | POA: Diagnosis not present

## 2015-06-09 DIAGNOSIS — I2510 Atherosclerotic heart disease of native coronary artery without angina pectoris: Secondary | ICD-10-CM | POA: Diagnosis not present

## 2015-06-09 DIAGNOSIS — E271 Primary adrenocortical insufficiency: Secondary | ICD-10-CM | POA: Diagnosis not present

## 2015-06-09 DIAGNOSIS — R260 Ataxic gait: Secondary | ICD-10-CM | POA: Diagnosis not present

## 2015-06-09 DIAGNOSIS — I509 Heart failure, unspecified: Secondary | ICD-10-CM | POA: Diagnosis not present

## 2015-06-10 ENCOUNTER — Ambulatory Visit (INDEPENDENT_AMBULATORY_CARE_PROVIDER_SITE_OTHER): Payer: Medicare Other | Admitting: Orthopaedic Surgery

## 2015-06-10 ENCOUNTER — Encounter: Payer: Self-pay | Admitting: Orthopaedic Surgery

## 2015-06-10 VITALS — BP 145/73 | HR 76 | Temp 97.9°F | Resp 18 | Ht 68.0 in | Wt 274.0 lb

## 2015-06-10 DIAGNOSIS — I509 Heart failure, unspecified: Secondary | ICD-10-CM | POA: Diagnosis not present

## 2015-06-10 DIAGNOSIS — M25561 Pain in right knee: Secondary | ICD-10-CM | POA: Diagnosis not present

## 2015-06-10 DIAGNOSIS — I716 Thoracoabdominal aortic aneurysm, without rupture, unspecified: Secondary | ICD-10-CM

## 2015-06-10 DIAGNOSIS — D689 Coagulation defect, unspecified: Secondary | ICD-10-CM | POA: Diagnosis not present

## 2015-06-10 DIAGNOSIS — J9611 Chronic respiratory failure with hypoxia: Secondary | ICD-10-CM

## 2015-06-10 DIAGNOSIS — I4891 Unspecified atrial fibrillation: Secondary | ICD-10-CM | POA: Diagnosis not present

## 2015-06-10 DIAGNOSIS — Z86718 Personal history of other venous thrombosis and embolism: Secondary | ICD-10-CM | POA: Diagnosis not present

## 2015-06-10 DIAGNOSIS — J441 Chronic obstructive pulmonary disease with (acute) exacerbation: Secondary | ICD-10-CM | POA: Diagnosis not present

## 2015-06-10 DIAGNOSIS — I2510 Atherosclerotic heart disease of native coronary artery without angina pectoris: Secondary | ICD-10-CM | POA: Diagnosis not present

## 2015-06-10 DIAGNOSIS — J9612 Chronic respiratory failure with hypercapnia: Secondary | ICD-10-CM

## 2015-06-10 DIAGNOSIS — M7061 Trochanteric bursitis, right hip: Secondary | ICD-10-CM

## 2015-06-10 DIAGNOSIS — M4800 Spinal stenosis, site unspecified: Secondary | ICD-10-CM | POA: Diagnosis not present

## 2015-06-10 DIAGNOSIS — I110 Hypertensive heart disease with heart failure: Secondary | ICD-10-CM | POA: Diagnosis not present

## 2015-06-10 DIAGNOSIS — R260 Ataxic gait: Secondary | ICD-10-CM | POA: Diagnosis not present

## 2015-06-10 DIAGNOSIS — M25552 Pain in left hip: Secondary | ICD-10-CM | POA: Diagnosis not present

## 2015-06-10 DIAGNOSIS — E271 Primary adrenocortical insufficiency: Secondary | ICD-10-CM | POA: Diagnosis not present

## 2015-06-10 DIAGNOSIS — E785 Hyperlipidemia, unspecified: Secondary | ICD-10-CM | POA: Diagnosis not present

## 2015-06-10 NOTE — Progress Notes (Signed)
Patient [id number] [name], male DOB:19-Oct-1940, 75 y.o. [id number]  Chief Complaint  Patient presents with  . Follow-up    follow up right hip pain "worse"    HPI  [name] is a 75 y.o. male who was seen last time for trochanteric bursitis of the right hip.  I did an injection. He is much improved on the right hip.    He has pain now of the right knee.  He has swelling and popping.  He has significant lower extremity edema (chronic) and wears support hose.  He has portable oxygen.  He has no trauma, no giving way, no locking.  He is taking his medicine.  He has no redness.  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  HPI  Body mass index is 41.67 kg/(m^2).  Review of Systems  Constitutional: Positive for activity change and fatigue.  HENT: Positive for congestion and sinus pressure.   Respiratory: Positive for cough and shortness of breath.        On supplemental oxygen  Cardiovascular: Positive for chest pain and leg swelling.  Endocrine: Positive for cold intolerance.  Musculoskeletal: Positive for myalgias and arthralgias.  Allergic/Immunologic: Positive for environmental allergies.  Neurological: Positive for weakness. Negative for numbness.    Past Medical History  Diagnosis Date  . Diabetes mellitus     type II  . Obesity   . Osteoarthritis   . Venous insufficiency     lower extremities  . Cholecystitis   . AAA (abdominal aortic aneurysm) ([place])     followed by Dr. Sherren Mocha Early  . Hyperlipidemia   . Clotting disorder ([place])     history of DVT  . Arthritis   . Lung nodules   . Aortic aneurysm without rupture ([place])   . Hypertension     Past Surgical History  Procedure Laterality Date  . Cholecystectomy    . Inguinal hernia repair    . Testicle surgery  2004    Dr. [name]  . Cataract extraction w/phaco  10/21/2010    Procedure: CATARACT EXTRACTION PHACO AND INTRAOCULAR LENS PLACEMENT (IOC);  Surgeon: Tonny Branch;  Location:  [facility];  Service: Ophthalmology;  Laterality: Left;  CDE:16.80  . Yag laser application Right [id number]    Procedure: YAG LASER APPLICATION;  Surgeon: [name], MD;  Location: [facility];  Service: Ophthalmology;  Laterality: Right;    Family History  Problem Relation Age of Onset  . Heart disease Mother   . Cervical cancer Mother   . Stroke Father   . Diabetes Father   . Arthritis Sister   . Cancer Brother     bladder  . Cancer Sister     cervical  . Pancreatic cancer Other   . Prostate cancer Other   . Anesthesia problems Neg Hx   . Hypotension Neg Hx   . Malignant hyperthermia Neg Hx   . Pseudochol deficiency Neg Hx     Social History Social History  Substance Use Topics  . Smoking status: Former Smoker    Types: Cigarettes    Quit date: 07/24/1999  . Smokeless tobacco: Never Used  . Alcohol Use: No    Allergies  Allergen Reactions  . Aspirin Other (See Comments)    Swells my prostate  . Claritin-D [Loratadine-Pseudoephedrine Er]   . Penicillins   . Sulfa Antibiotics     Current Outpatient Prescriptions  Medication Sig Dispense Refill  . acetaminophen (TYLENOL) 650 MG CR tablet Take  650 mg by mouth every 8 (eight) hours as needed for pain.    Marland Kitchen atorvastatin (LIPITOR) 20 MG tablet Take 20 mg by mouth at bedtime.      . clotrimazole-betamethasone (LOTRISONE) cream Apply 1 application topically 2 (two) times daily.    Marland Kitchen dexamethasone (DECADRON) 0.75 MG tablet Take 0.75 mg by mouth daily.    . furosemide (LASIX) 20 MG tablet Take 60 mg by mouth daily.     Marland Kitchen gabapentin (NEURONTIN) 100 MG tablet Take 100 mg by mouth 2 (two) times daily.     Marland Kitchen HYDROmorphone (DILAUDID) 4 MG tablet Take 4 mg by mouth every 6 (six) hours as needed for moderate pain.     . metoprolol succinate (TOPROL XL) 25 MG 24 hr tablet Take 1 tablet (25 mg total) by mouth daily. 30 tablet 6  . omeprazole (PRILOSEC) 20 MG capsule Take 20 mg by mouth daily.      . Potassium 99 MG TABS Take 1 tablet  by mouth daily.    . predniSONE (DELTASONE) 10 MG tablet Take 10 mg by mouth daily with breakfast. Take 1 in the morning and 1/2 at hs.    . testosterone enanthate (DELATESTRYL) 200 MG/ML injection Inject into the muscle every 14 (fourteen) days. For IM use only    . warfarin (COUMADIN) 2 MG tablet Take 2 mg by mouth daily. Takes with a 7.5 mg tablet for total dose of 9.5 mg    . warfarin (COUMADIN) 7.5 MG tablet Take 7.5 mg by mouth daily. Takes with a 2 mg tablet for total dose of 9.5 mg     No current facility-administered medications for this visit.   Facility-Administered Medications Ordered in Other Visits  Medication Dose Route Frequency Provider Last Rate Last Dose  . fentaNYL (SUBLIMAZE) injection 25-50 mcg  25-50 mcg Intravenous Q5 min PRN Lerry Liner, MD         Physical Exam  Blood pressure 145/73, pulse 76, temperature 97.9 F (36.6 C), resp. rate 18, height 5\' 8"  (1.727 m), weight 274 lb (124.286 kg).  Constitutional: overall normal hygiene, normal nutrition, well developed, normal grooming, normal body habitus. Assistive device:walker  Musculoskeletal: gait and station Limp right, muscle tone and strength are normal, no tremors or atrophy is present.  .  Neurological: coordination overall normal.  Deep tendon reflex/nerve stretch intact.  Sensation normal.  Cranial nerves II-XII intact.   Skin:   normal overall no scars, lesions, ulcers or rashes. No psoriasis.  He has brawny edema of the lower legs.  Psychiatric: Alert and oriented x 3.  Recent memory intact, remote memory unclear.  Normal mood and affect. Well groomed.  Good eye contact.  Cardiovascular: overall no swelling, no varicosities, no edema bilaterally, normal temperatures of the legs and arms, no clubbing, cyanosis and good capillary refill.  Lymphatic: palpation is normal.  The right lower extremity is examined:  Inspection:  Thigh:  Non-tender and no defects  Knee has swelling 1+ effusion.                         Joint tenderness is present                        Patient is tender over the medial joint line  Lower Leg:  Has normal appearance and no tenderness or defects  Ankle:  Non-tender and no defects  Foot:  Non-tender and no defects Range of Motion:  Knee:  Range of motion is: 0-95                        Crepitus is  present  Ankle:  Range of motion is normal. Strength and Tone:  The right lower extremity has normal strength and tone. Stability:  Knee:  The knee is stable.  Ankle:  The ankle is stable.  His right hip is not that tender today.  The patient has been educated about the nature of the problem(s) and counseled on treatment options.  The patient appeared to understand what I have discussed and is in agreement with it.  Encounter Diagnoses  Name Primary?  . Right knee pain   . Trochanteric bursitis of right hip Yes  . Thoracoabdominal aortic aneurysm, without rupture ([place])   . Chronic respiratory failure with hypoxia and hypercapnia (HCC)   . Clotting disorder ([place])     PROCEDURE NOTE:  The patient requests injections of the right knee , verbal consent was obtained.  The right knee was prepped appropriately after time out was performed.   Sterile technique was observed and injection of 1 cc of Depo-Medrol 40 mg with several cc's of plain xylocaine. Anesthesia was provided by ethyl chloride and a 20-gauge needle was used to inject the knee area. The injection was tolerated well.  A band aid dressing was applied.  The patient was advised to apply ice later today and tomorrow to the injection sight as needed. PLAN Call if any problems.  Precautions discussed.  Continue current medications.   Return to clinic as needed.

## 2015-06-11 DIAGNOSIS — I2510 Atherosclerotic heart disease of native coronary artery without angina pectoris: Secondary | ICD-10-CM | POA: Diagnosis not present

## 2015-06-11 DIAGNOSIS — I4891 Unspecified atrial fibrillation: Secondary | ICD-10-CM | POA: Diagnosis not present

## 2015-06-11 DIAGNOSIS — M25552 Pain in left hip: Secondary | ICD-10-CM | POA: Diagnosis not present

## 2015-06-11 DIAGNOSIS — I509 Heart failure, unspecified: Secondary | ICD-10-CM | POA: Diagnosis not present

## 2015-06-11 DIAGNOSIS — I110 Hypertensive heart disease with heart failure: Secondary | ICD-10-CM | POA: Diagnosis not present

## 2015-06-11 DIAGNOSIS — R260 Ataxic gait: Secondary | ICD-10-CM | POA: Diagnosis not present

## 2015-06-11 DIAGNOSIS — Z86718 Personal history of other venous thrombosis and embolism: Secondary | ICD-10-CM | POA: Diagnosis not present

## 2015-06-11 DIAGNOSIS — E271 Primary adrenocortical insufficiency: Secondary | ICD-10-CM | POA: Diagnosis not present

## 2015-06-11 DIAGNOSIS — M4800 Spinal stenosis, site unspecified: Secondary | ICD-10-CM | POA: Diagnosis not present

## 2015-06-11 DIAGNOSIS — E785 Hyperlipidemia, unspecified: Secondary | ICD-10-CM | POA: Diagnosis not present

## 2015-06-11 DIAGNOSIS — J441 Chronic obstructive pulmonary disease with (acute) exacerbation: Secondary | ICD-10-CM | POA: Diagnosis not present

## 2015-06-12 DIAGNOSIS — I509 Heart failure, unspecified: Secondary | ICD-10-CM | POA: Diagnosis not present

## 2015-06-12 DIAGNOSIS — R260 Ataxic gait: Secondary | ICD-10-CM | POA: Diagnosis not present

## 2015-06-12 DIAGNOSIS — I2510 Atherosclerotic heart disease of native coronary artery without angina pectoris: Secondary | ICD-10-CM | POA: Diagnosis not present

## 2015-06-12 DIAGNOSIS — M25552 Pain in left hip: Secondary | ICD-10-CM | POA: Diagnosis not present

## 2015-06-12 DIAGNOSIS — J441 Chronic obstructive pulmonary disease with (acute) exacerbation: Secondary | ICD-10-CM | POA: Diagnosis not present

## 2015-06-12 DIAGNOSIS — E785 Hyperlipidemia, unspecified: Secondary | ICD-10-CM | POA: Diagnosis not present

## 2015-06-12 DIAGNOSIS — I110 Hypertensive heart disease with heart failure: Secondary | ICD-10-CM | POA: Diagnosis not present

## 2015-06-12 DIAGNOSIS — I4891 Unspecified atrial fibrillation: Secondary | ICD-10-CM | POA: Diagnosis not present

## 2015-06-12 DIAGNOSIS — E271 Primary adrenocortical insufficiency: Secondary | ICD-10-CM | POA: Diagnosis not present

## 2015-06-12 DIAGNOSIS — M4800 Spinal stenosis, site unspecified: Secondary | ICD-10-CM | POA: Diagnosis not present

## 2015-06-12 DIAGNOSIS — Z86718 Personal history of other venous thrombosis and embolism: Secondary | ICD-10-CM | POA: Diagnosis not present

## 2015-06-13 DIAGNOSIS — I110 Hypertensive heart disease with heart failure: Secondary | ICD-10-CM | POA: Diagnosis not present

## 2015-06-13 DIAGNOSIS — J441 Chronic obstructive pulmonary disease with (acute) exacerbation: Secondary | ICD-10-CM | POA: Diagnosis not present

## 2015-06-13 DIAGNOSIS — M25551 Pain in right hip: Secondary | ICD-10-CM | POA: Diagnosis not present

## 2015-06-13 DIAGNOSIS — I509 Heart failure, unspecified: Secondary | ICD-10-CM | POA: Diagnosis not present

## 2015-06-13 DIAGNOSIS — I4891 Unspecified atrial fibrillation: Secondary | ICD-10-CM | POA: Diagnosis not present

## 2015-06-16 DIAGNOSIS — M25561 Pain in right knee: Secondary | ICD-10-CM | POA: Diagnosis not present

## 2015-06-16 DIAGNOSIS — I509 Heart failure, unspecified: Secondary | ICD-10-CM | POA: Diagnosis not present

## 2015-06-16 DIAGNOSIS — I4891 Unspecified atrial fibrillation: Secondary | ICD-10-CM | POA: Diagnosis not present

## 2015-06-16 DIAGNOSIS — Z7901 Long term (current) use of anticoagulants: Secondary | ICD-10-CM | POA: Diagnosis not present

## 2015-06-16 DIAGNOSIS — Z7902 Long term (current) use of antithrombotics/antiplatelets: Secondary | ICD-10-CM | POA: Diagnosis not present

## 2015-06-16 DIAGNOSIS — Z9981 Dependence on supplemental oxygen: Secondary | ICD-10-CM | POA: Diagnosis not present

## 2015-06-16 DIAGNOSIS — I110 Hypertensive heart disease with heart failure: Secondary | ICD-10-CM | POA: Diagnosis not present

## 2015-06-16 DIAGNOSIS — M25551 Pain in right hip: Secondary | ICD-10-CM | POA: Diagnosis not present

## 2015-06-16 DIAGNOSIS — J441 Chronic obstructive pulmonary disease with (acute) exacerbation: Secondary | ICD-10-CM | POA: Diagnosis not present

## 2015-06-16 DIAGNOSIS — I2510 Atherosclerotic heart disease of native coronary artery without angina pectoris: Secondary | ICD-10-CM | POA: Diagnosis not present

## 2015-06-16 DIAGNOSIS — Z5181 Encounter for therapeutic drug level monitoring: Secondary | ICD-10-CM | POA: Diagnosis not present

## 2015-06-16 DIAGNOSIS — E271 Primary adrenocortical insufficiency: Secondary | ICD-10-CM | POA: Diagnosis not present

## 2015-06-17 ENCOUNTER — Other Ambulatory Visit (HOSPITAL_COMMUNITY): Payer: Self-pay | Admitting: Respiratory Therapy

## 2015-06-17 DIAGNOSIS — G4730 Sleep apnea, unspecified: Secondary | ICD-10-CM

## 2015-06-18 DIAGNOSIS — I509 Heart failure, unspecified: Secondary | ICD-10-CM | POA: Diagnosis not present

## 2015-06-18 DIAGNOSIS — J441 Chronic obstructive pulmonary disease with (acute) exacerbation: Secondary | ICD-10-CM | POA: Diagnosis not present

## 2015-06-18 DIAGNOSIS — Z9981 Dependence on supplemental oxygen: Secondary | ICD-10-CM | POA: Diagnosis not present

## 2015-06-18 DIAGNOSIS — Z5181 Encounter for therapeutic drug level monitoring: Secondary | ICD-10-CM | POA: Diagnosis not present

## 2015-06-18 DIAGNOSIS — E271 Primary adrenocortical insufficiency: Secondary | ICD-10-CM | POA: Diagnosis not present

## 2015-06-18 DIAGNOSIS — I110 Hypertensive heart disease with heart failure: Secondary | ICD-10-CM | POA: Diagnosis not present

## 2015-06-18 DIAGNOSIS — I2510 Atherosclerotic heart disease of native coronary artery without angina pectoris: Secondary | ICD-10-CM | POA: Diagnosis not present

## 2015-06-18 DIAGNOSIS — Z7902 Long term (current) use of antithrombotics/antiplatelets: Secondary | ICD-10-CM | POA: Diagnosis not present

## 2015-06-18 DIAGNOSIS — Z7901 Long term (current) use of anticoagulants: Secondary | ICD-10-CM | POA: Diagnosis not present

## 2015-06-18 DIAGNOSIS — M25561 Pain in right knee: Secondary | ICD-10-CM | POA: Diagnosis not present

## 2015-06-18 DIAGNOSIS — M25551 Pain in right hip: Secondary | ICD-10-CM | POA: Diagnosis not present

## 2015-06-18 DIAGNOSIS — I4891 Unspecified atrial fibrillation: Secondary | ICD-10-CM | POA: Diagnosis not present

## 2015-06-19 DIAGNOSIS — J441 Chronic obstructive pulmonary disease with (acute) exacerbation: Secondary | ICD-10-CM | POA: Diagnosis not present

## 2015-06-19 DIAGNOSIS — E271 Primary adrenocortical insufficiency: Secondary | ICD-10-CM | POA: Diagnosis not present

## 2015-06-19 DIAGNOSIS — Z7902 Long term (current) use of antithrombotics/antiplatelets: Secondary | ICD-10-CM | POA: Diagnosis not present

## 2015-06-19 DIAGNOSIS — Z9981 Dependence on supplemental oxygen: Secondary | ICD-10-CM | POA: Diagnosis not present

## 2015-06-19 DIAGNOSIS — I2510 Atherosclerotic heart disease of native coronary artery without angina pectoris: Secondary | ICD-10-CM | POA: Diagnosis not present

## 2015-06-19 DIAGNOSIS — M25561 Pain in right knee: Secondary | ICD-10-CM | POA: Diagnosis not present

## 2015-06-19 DIAGNOSIS — I110 Hypertensive heart disease with heart failure: Secondary | ICD-10-CM | POA: Diagnosis not present

## 2015-06-19 DIAGNOSIS — I509 Heart failure, unspecified: Secondary | ICD-10-CM | POA: Diagnosis not present

## 2015-06-19 DIAGNOSIS — M25551 Pain in right hip: Secondary | ICD-10-CM | POA: Diagnosis not present

## 2015-06-19 DIAGNOSIS — Z7901 Long term (current) use of anticoagulants: Secondary | ICD-10-CM | POA: Diagnosis not present

## 2015-06-19 DIAGNOSIS — I4891 Unspecified atrial fibrillation: Secondary | ICD-10-CM | POA: Diagnosis not present

## 2015-06-19 DIAGNOSIS — Z5181 Encounter for therapeutic drug level monitoring: Secondary | ICD-10-CM | POA: Diagnosis not present

## 2015-06-23 DIAGNOSIS — I4891 Unspecified atrial fibrillation: Secondary | ICD-10-CM | POA: Diagnosis not present

## 2015-06-23 DIAGNOSIS — M25561 Pain in right knee: Secondary | ICD-10-CM | POA: Diagnosis not present

## 2015-06-23 DIAGNOSIS — Z7901 Long term (current) use of anticoagulants: Secondary | ICD-10-CM | POA: Diagnosis not present

## 2015-06-23 DIAGNOSIS — J441 Chronic obstructive pulmonary disease with (acute) exacerbation: Secondary | ICD-10-CM | POA: Diagnosis not present

## 2015-06-23 DIAGNOSIS — M25551 Pain in right hip: Secondary | ICD-10-CM | POA: Diagnosis not present

## 2015-06-23 DIAGNOSIS — I509 Heart failure, unspecified: Secondary | ICD-10-CM | POA: Diagnosis not present

## 2015-06-23 DIAGNOSIS — Z9981 Dependence on supplemental oxygen: Secondary | ICD-10-CM | POA: Diagnosis not present

## 2015-06-23 DIAGNOSIS — Z5181 Encounter for therapeutic drug level monitoring: Secondary | ICD-10-CM | POA: Diagnosis not present

## 2015-06-23 DIAGNOSIS — I110 Hypertensive heart disease with heart failure: Secondary | ICD-10-CM | POA: Diagnosis not present

## 2015-06-23 DIAGNOSIS — Z7902 Long term (current) use of antithrombotics/antiplatelets: Secondary | ICD-10-CM | POA: Diagnosis not present

## 2015-06-23 DIAGNOSIS — E271 Primary adrenocortical insufficiency: Secondary | ICD-10-CM | POA: Diagnosis not present

## 2015-06-23 DIAGNOSIS — I2510 Atherosclerotic heart disease of native coronary artery without angina pectoris: Secondary | ICD-10-CM | POA: Diagnosis not present

## 2015-06-25 DIAGNOSIS — Z5181 Encounter for therapeutic drug level monitoring: Secondary | ICD-10-CM | POA: Diagnosis not present

## 2015-06-25 DIAGNOSIS — Z7901 Long term (current) use of anticoagulants: Secondary | ICD-10-CM | POA: Diagnosis not present

## 2015-06-25 DIAGNOSIS — M25551 Pain in right hip: Secondary | ICD-10-CM | POA: Diagnosis not present

## 2015-06-25 DIAGNOSIS — I2510 Atherosclerotic heart disease of native coronary artery without angina pectoris: Secondary | ICD-10-CM | POA: Diagnosis not present

## 2015-06-25 DIAGNOSIS — Z7902 Long term (current) use of antithrombotics/antiplatelets: Secondary | ICD-10-CM | POA: Diagnosis not present

## 2015-06-25 DIAGNOSIS — I509 Heart failure, unspecified: Secondary | ICD-10-CM | POA: Diagnosis not present

## 2015-06-25 DIAGNOSIS — J441 Chronic obstructive pulmonary disease with (acute) exacerbation: Secondary | ICD-10-CM | POA: Diagnosis not present

## 2015-06-25 DIAGNOSIS — E271 Primary adrenocortical insufficiency: Secondary | ICD-10-CM | POA: Diagnosis not present

## 2015-06-25 DIAGNOSIS — I4891 Unspecified atrial fibrillation: Secondary | ICD-10-CM | POA: Diagnosis not present

## 2015-06-25 DIAGNOSIS — Z9981 Dependence on supplemental oxygen: Secondary | ICD-10-CM | POA: Diagnosis not present

## 2015-06-25 DIAGNOSIS — M25561 Pain in right knee: Secondary | ICD-10-CM | POA: Diagnosis not present

## 2015-06-25 DIAGNOSIS — I110 Hypertensive heart disease with heart failure: Secondary | ICD-10-CM | POA: Diagnosis not present

## 2015-06-27 DIAGNOSIS — J449 Chronic obstructive pulmonary disease, unspecified: Secondary | ICD-10-CM | POA: Diagnosis not present

## 2015-06-30 DIAGNOSIS — Z9981 Dependence on supplemental oxygen: Secondary | ICD-10-CM | POA: Diagnosis not present

## 2015-06-30 DIAGNOSIS — Z5181 Encounter for therapeutic drug level monitoring: Secondary | ICD-10-CM | POA: Diagnosis not present

## 2015-06-30 DIAGNOSIS — I509 Heart failure, unspecified: Secondary | ICD-10-CM | POA: Diagnosis not present

## 2015-06-30 DIAGNOSIS — I110 Hypertensive heart disease with heart failure: Secondary | ICD-10-CM | POA: Diagnosis not present

## 2015-06-30 DIAGNOSIS — J449 Chronic obstructive pulmonary disease, unspecified: Secondary | ICD-10-CM | POA: Diagnosis not present

## 2015-06-30 DIAGNOSIS — I4891 Unspecified atrial fibrillation: Secondary | ICD-10-CM | POA: Diagnosis not present

## 2015-06-30 DIAGNOSIS — Z7902 Long term (current) use of antithrombotics/antiplatelets: Secondary | ICD-10-CM | POA: Diagnosis not present

## 2015-06-30 DIAGNOSIS — M25551 Pain in right hip: Secondary | ICD-10-CM | POA: Diagnosis not present

## 2015-06-30 DIAGNOSIS — I2510 Atherosclerotic heart disease of native coronary artery without angina pectoris: Secondary | ICD-10-CM | POA: Diagnosis not present

## 2015-06-30 DIAGNOSIS — G4730 Sleep apnea, unspecified: Secondary | ICD-10-CM | POA: Diagnosis not present

## 2015-06-30 DIAGNOSIS — J441 Chronic obstructive pulmonary disease with (acute) exacerbation: Secondary | ICD-10-CM | POA: Diagnosis not present

## 2015-06-30 DIAGNOSIS — E271 Primary adrenocortical insufficiency: Secondary | ICD-10-CM | POA: Diagnosis not present

## 2015-06-30 DIAGNOSIS — Z7901 Long term (current) use of anticoagulants: Secondary | ICD-10-CM | POA: Diagnosis not present

## 2015-06-30 DIAGNOSIS — M25561 Pain in right knee: Secondary | ICD-10-CM | POA: Diagnosis not present

## 2015-06-30 DIAGNOSIS — J9611 Chronic respiratory failure with hypoxia: Secondary | ICD-10-CM | POA: Diagnosis not present

## 2015-07-01 DIAGNOSIS — M25561 Pain in right knee: Secondary | ICD-10-CM | POA: Diagnosis not present

## 2015-07-01 DIAGNOSIS — J441 Chronic obstructive pulmonary disease with (acute) exacerbation: Secondary | ICD-10-CM | POA: Diagnosis not present

## 2015-07-01 DIAGNOSIS — Z7902 Long term (current) use of antithrombotics/antiplatelets: Secondary | ICD-10-CM | POA: Diagnosis not present

## 2015-07-01 DIAGNOSIS — Z9981 Dependence on supplemental oxygen: Secondary | ICD-10-CM | POA: Diagnosis not present

## 2015-07-01 DIAGNOSIS — Z5181 Encounter for therapeutic drug level monitoring: Secondary | ICD-10-CM | POA: Diagnosis not present

## 2015-07-01 DIAGNOSIS — M25551 Pain in right hip: Secondary | ICD-10-CM | POA: Diagnosis not present

## 2015-07-01 DIAGNOSIS — I509 Heart failure, unspecified: Secondary | ICD-10-CM | POA: Diagnosis not present

## 2015-07-01 DIAGNOSIS — I110 Hypertensive heart disease with heart failure: Secondary | ICD-10-CM | POA: Diagnosis not present

## 2015-07-01 DIAGNOSIS — Z7901 Long term (current) use of anticoagulants: Secondary | ICD-10-CM | POA: Diagnosis not present

## 2015-07-01 DIAGNOSIS — I2510 Atherosclerotic heart disease of native coronary artery without angina pectoris: Secondary | ICD-10-CM | POA: Diagnosis not present

## 2015-07-01 DIAGNOSIS — I4891 Unspecified atrial fibrillation: Secondary | ICD-10-CM | POA: Diagnosis not present

## 2015-07-01 DIAGNOSIS — E271 Primary adrenocortical insufficiency: Secondary | ICD-10-CM | POA: Diagnosis not present

## 2015-07-02 DIAGNOSIS — Z79899 Other long term (current) drug therapy: Secondary | ICD-10-CM | POA: Diagnosis not present

## 2015-07-02 DIAGNOSIS — I509 Heart failure, unspecified: Secondary | ICD-10-CM | POA: Diagnosis not present

## 2015-07-02 DIAGNOSIS — R0602 Shortness of breath: Secondary | ICD-10-CM | POA: Diagnosis not present

## 2015-07-02 DIAGNOSIS — M25561 Pain in right knee: Secondary | ICD-10-CM | POA: Diagnosis not present

## 2015-07-02 DIAGNOSIS — I4891 Unspecified atrial fibrillation: Secondary | ICD-10-CM | POA: Diagnosis not present

## 2015-07-02 DIAGNOSIS — Z7902 Long term (current) use of antithrombotics/antiplatelets: Secondary | ICD-10-CM | POA: Diagnosis not present

## 2015-07-02 DIAGNOSIS — Z87891 Personal history of nicotine dependence: Secondary | ICD-10-CM | POA: Diagnosis not present

## 2015-07-02 DIAGNOSIS — Z7952 Long term (current) use of systemic steroids: Secondary | ICD-10-CM | POA: Diagnosis not present

## 2015-07-02 DIAGNOSIS — M25551 Pain in right hip: Secondary | ICD-10-CM | POA: Diagnosis not present

## 2015-07-02 DIAGNOSIS — R0789 Other chest pain: Secondary | ICD-10-CM | POA: Diagnosis not present

## 2015-07-02 DIAGNOSIS — J441 Chronic obstructive pulmonary disease with (acute) exacerbation: Secondary | ICD-10-CM | POA: Diagnosis not present

## 2015-07-02 DIAGNOSIS — Z5181 Encounter for therapeutic drug level monitoring: Secondary | ICD-10-CM | POA: Diagnosis not present

## 2015-07-02 DIAGNOSIS — I2510 Atherosclerotic heart disease of native coronary artery without angina pectoris: Secondary | ICD-10-CM | POA: Diagnosis not present

## 2015-07-02 DIAGNOSIS — I110 Hypertensive heart disease with heart failure: Secondary | ICD-10-CM | POA: Diagnosis not present

## 2015-07-02 DIAGNOSIS — E271 Primary adrenocortical insufficiency: Secondary | ICD-10-CM | POA: Diagnosis not present

## 2015-07-02 DIAGNOSIS — Z86718 Personal history of other venous thrombosis and embolism: Secondary | ICD-10-CM | POA: Diagnosis not present

## 2015-07-02 DIAGNOSIS — Z7901 Long term (current) use of anticoagulants: Secondary | ICD-10-CM | POA: Diagnosis not present

## 2015-07-02 DIAGNOSIS — Z9981 Dependence on supplemental oxygen: Secondary | ICD-10-CM | POA: Diagnosis not present

## 2015-07-02 DIAGNOSIS — R079 Chest pain, unspecified: Secondary | ICD-10-CM | POA: Diagnosis not present

## 2015-07-02 DIAGNOSIS — R072 Precordial pain: Secondary | ICD-10-CM | POA: Diagnosis not present

## 2015-07-06 DIAGNOSIS — I2510 Atherosclerotic heart disease of native coronary artery without angina pectoris: Secondary | ICD-10-CM | POA: Diagnosis not present

## 2015-07-06 DIAGNOSIS — M25551 Pain in right hip: Secondary | ICD-10-CM | POA: Diagnosis not present

## 2015-07-06 DIAGNOSIS — I4891 Unspecified atrial fibrillation: Secondary | ICD-10-CM | POA: Diagnosis not present

## 2015-07-06 DIAGNOSIS — Z7901 Long term (current) use of anticoagulants: Secondary | ICD-10-CM | POA: Diagnosis not present

## 2015-07-06 DIAGNOSIS — J441 Chronic obstructive pulmonary disease with (acute) exacerbation: Secondary | ICD-10-CM | POA: Diagnosis not present

## 2015-07-06 DIAGNOSIS — Z9981 Dependence on supplemental oxygen: Secondary | ICD-10-CM | POA: Diagnosis not present

## 2015-07-06 DIAGNOSIS — Z5181 Encounter for therapeutic drug level monitoring: Secondary | ICD-10-CM | POA: Diagnosis not present

## 2015-07-06 DIAGNOSIS — I110 Hypertensive heart disease with heart failure: Secondary | ICD-10-CM | POA: Diagnosis not present

## 2015-07-06 DIAGNOSIS — Z7902 Long term (current) use of antithrombotics/antiplatelets: Secondary | ICD-10-CM | POA: Diagnosis not present

## 2015-07-06 DIAGNOSIS — M25561 Pain in right knee: Secondary | ICD-10-CM | POA: Diagnosis not present

## 2015-07-06 DIAGNOSIS — E271 Primary adrenocortical insufficiency: Secondary | ICD-10-CM | POA: Diagnosis not present

## 2015-07-06 DIAGNOSIS — I509 Heart failure, unspecified: Secondary | ICD-10-CM | POA: Diagnosis not present

## 2015-07-08 DIAGNOSIS — E291 Testicular hypofunction: Secondary | ICD-10-CM | POA: Diagnosis not present

## 2015-07-08 DIAGNOSIS — J9620 Acute and chronic respiratory failure, unspecified whether with hypoxia or hypercapnia: Secondary | ICD-10-CM | POA: Diagnosis not present

## 2015-07-08 DIAGNOSIS — Z1389 Encounter for screening for other disorder: Secondary | ICD-10-CM | POA: Diagnosis not present

## 2015-07-08 DIAGNOSIS — J449 Chronic obstructive pulmonary disease, unspecified: Secondary | ICD-10-CM | POA: Diagnosis not present

## 2015-07-08 DIAGNOSIS — M1991 Primary osteoarthritis, unspecified site: Secondary | ICD-10-CM | POA: Diagnosis not present

## 2015-07-08 DIAGNOSIS — I872 Venous insufficiency (chronic) (peripheral): Secondary | ICD-10-CM | POA: Diagnosis not present

## 2015-07-09 DIAGNOSIS — I2510 Atherosclerotic heart disease of native coronary artery without angina pectoris: Secondary | ICD-10-CM | POA: Diagnosis not present

## 2015-07-09 DIAGNOSIS — I509 Heart failure, unspecified: Secondary | ICD-10-CM | POA: Diagnosis not present

## 2015-07-09 DIAGNOSIS — Z7901 Long term (current) use of anticoagulants: Secondary | ICD-10-CM | POA: Diagnosis not present

## 2015-07-09 DIAGNOSIS — M25551 Pain in right hip: Secondary | ICD-10-CM | POA: Diagnosis not present

## 2015-07-09 DIAGNOSIS — Z5181 Encounter for therapeutic drug level monitoring: Secondary | ICD-10-CM | POA: Diagnosis not present

## 2015-07-09 DIAGNOSIS — E271 Primary adrenocortical insufficiency: Secondary | ICD-10-CM | POA: Diagnosis not present

## 2015-07-09 DIAGNOSIS — M25561 Pain in right knee: Secondary | ICD-10-CM | POA: Diagnosis not present

## 2015-07-09 DIAGNOSIS — I110 Hypertensive heart disease with heart failure: Secondary | ICD-10-CM | POA: Diagnosis not present

## 2015-07-09 DIAGNOSIS — J441 Chronic obstructive pulmonary disease with (acute) exacerbation: Secondary | ICD-10-CM | POA: Diagnosis not present

## 2015-07-09 DIAGNOSIS — Z9981 Dependence on supplemental oxygen: Secondary | ICD-10-CM | POA: Diagnosis not present

## 2015-07-09 DIAGNOSIS — Z7902 Long term (current) use of antithrombotics/antiplatelets: Secondary | ICD-10-CM | POA: Diagnosis not present

## 2015-07-09 DIAGNOSIS — I4891 Unspecified atrial fibrillation: Secondary | ICD-10-CM | POA: Diagnosis not present

## 2015-07-10 DIAGNOSIS — I110 Hypertensive heart disease with heart failure: Secondary | ICD-10-CM | POA: Diagnosis not present

## 2015-07-10 DIAGNOSIS — J441 Chronic obstructive pulmonary disease with (acute) exacerbation: Secondary | ICD-10-CM | POA: Diagnosis not present

## 2015-07-10 DIAGNOSIS — Z5181 Encounter for therapeutic drug level monitoring: Secondary | ICD-10-CM | POA: Diagnosis not present

## 2015-07-10 DIAGNOSIS — I4891 Unspecified atrial fibrillation: Secondary | ICD-10-CM | POA: Diagnosis not present

## 2015-07-10 DIAGNOSIS — I2510 Atherosclerotic heart disease of native coronary artery without angina pectoris: Secondary | ICD-10-CM | POA: Diagnosis not present

## 2015-07-10 DIAGNOSIS — E271 Primary adrenocortical insufficiency: Secondary | ICD-10-CM | POA: Diagnosis not present

## 2015-07-10 DIAGNOSIS — Z7901 Long term (current) use of anticoagulants: Secondary | ICD-10-CM | POA: Diagnosis not present

## 2015-07-10 DIAGNOSIS — I509 Heart failure, unspecified: Secondary | ICD-10-CM | POA: Diagnosis not present

## 2015-07-10 DIAGNOSIS — Z7902 Long term (current) use of antithrombotics/antiplatelets: Secondary | ICD-10-CM | POA: Diagnosis not present

## 2015-07-10 DIAGNOSIS — M25561 Pain in right knee: Secondary | ICD-10-CM | POA: Diagnosis not present

## 2015-07-10 DIAGNOSIS — Z9981 Dependence on supplemental oxygen: Secondary | ICD-10-CM | POA: Diagnosis not present

## 2015-07-10 DIAGNOSIS — M25551 Pain in right hip: Secondary | ICD-10-CM | POA: Diagnosis not present

## 2015-07-14 DIAGNOSIS — E271 Primary adrenocortical insufficiency: Secondary | ICD-10-CM | POA: Diagnosis not present

## 2015-07-14 DIAGNOSIS — Z7901 Long term (current) use of anticoagulants: Secondary | ICD-10-CM | POA: Diagnosis not present

## 2015-07-14 DIAGNOSIS — M25561 Pain in right knee: Secondary | ICD-10-CM | POA: Diagnosis not present

## 2015-07-14 DIAGNOSIS — Z5181 Encounter for therapeutic drug level monitoring: Secondary | ICD-10-CM | POA: Diagnosis not present

## 2015-07-14 DIAGNOSIS — I4891 Unspecified atrial fibrillation: Secondary | ICD-10-CM | POA: Diagnosis not present

## 2015-07-14 DIAGNOSIS — I110 Hypertensive heart disease with heart failure: Secondary | ICD-10-CM | POA: Diagnosis not present

## 2015-07-14 DIAGNOSIS — I509 Heart failure, unspecified: Secondary | ICD-10-CM | POA: Diagnosis not present

## 2015-07-14 DIAGNOSIS — M25551 Pain in right hip: Secondary | ICD-10-CM | POA: Diagnosis not present

## 2015-07-14 DIAGNOSIS — I2510 Atherosclerotic heart disease of native coronary artery without angina pectoris: Secondary | ICD-10-CM | POA: Diagnosis not present

## 2015-07-14 DIAGNOSIS — Z7902 Long term (current) use of antithrombotics/antiplatelets: Secondary | ICD-10-CM | POA: Diagnosis not present

## 2015-07-14 DIAGNOSIS — J441 Chronic obstructive pulmonary disease with (acute) exacerbation: Secondary | ICD-10-CM | POA: Diagnosis not present

## 2015-07-14 DIAGNOSIS — Z9981 Dependence on supplemental oxygen: Secondary | ICD-10-CM | POA: Diagnosis not present

## 2015-07-16 DIAGNOSIS — M25551 Pain in right hip: Secondary | ICD-10-CM | POA: Diagnosis not present

## 2015-07-16 DIAGNOSIS — I110 Hypertensive heart disease with heart failure: Secondary | ICD-10-CM | POA: Diagnosis not present

## 2015-07-16 DIAGNOSIS — E271 Primary adrenocortical insufficiency: Secondary | ICD-10-CM | POA: Diagnosis not present

## 2015-07-16 DIAGNOSIS — I509 Heart failure, unspecified: Secondary | ICD-10-CM | POA: Diagnosis not present

## 2015-07-16 DIAGNOSIS — M25561 Pain in right knee: Secondary | ICD-10-CM | POA: Diagnosis not present

## 2015-07-16 DIAGNOSIS — Z9981 Dependence on supplemental oxygen: Secondary | ICD-10-CM | POA: Diagnosis not present

## 2015-07-16 DIAGNOSIS — J441 Chronic obstructive pulmonary disease with (acute) exacerbation: Secondary | ICD-10-CM | POA: Diagnosis not present

## 2015-07-16 DIAGNOSIS — Z7902 Long term (current) use of antithrombotics/antiplatelets: Secondary | ICD-10-CM | POA: Diagnosis not present

## 2015-07-16 DIAGNOSIS — I4891 Unspecified atrial fibrillation: Secondary | ICD-10-CM | POA: Diagnosis not present

## 2015-07-16 DIAGNOSIS — Z7901 Long term (current) use of anticoagulants: Secondary | ICD-10-CM | POA: Diagnosis not present

## 2015-07-16 DIAGNOSIS — I2510 Atherosclerotic heart disease of native coronary artery without angina pectoris: Secondary | ICD-10-CM | POA: Diagnosis not present

## 2015-07-16 DIAGNOSIS — Z5181 Encounter for therapeutic drug level monitoring: Secondary | ICD-10-CM | POA: Diagnosis not present

## 2015-07-20 DIAGNOSIS — M25561 Pain in right knee: Secondary | ICD-10-CM | POA: Diagnosis not present

## 2015-07-20 DIAGNOSIS — Z9981 Dependence on supplemental oxygen: Secondary | ICD-10-CM | POA: Diagnosis not present

## 2015-07-20 DIAGNOSIS — I2510 Atherosclerotic heart disease of native coronary artery without angina pectoris: Secondary | ICD-10-CM | POA: Diagnosis not present

## 2015-07-20 DIAGNOSIS — I110 Hypertensive heart disease with heart failure: Secondary | ICD-10-CM | POA: Diagnosis not present

## 2015-07-20 DIAGNOSIS — I509 Heart failure, unspecified: Secondary | ICD-10-CM | POA: Diagnosis not present

## 2015-07-20 DIAGNOSIS — E271 Primary adrenocortical insufficiency: Secondary | ICD-10-CM | POA: Diagnosis not present

## 2015-07-20 DIAGNOSIS — Z5181 Encounter for therapeutic drug level monitoring: Secondary | ICD-10-CM | POA: Diagnosis not present

## 2015-07-20 DIAGNOSIS — Z7902 Long term (current) use of antithrombotics/antiplatelets: Secondary | ICD-10-CM | POA: Diagnosis not present

## 2015-07-20 DIAGNOSIS — J441 Chronic obstructive pulmonary disease with (acute) exacerbation: Secondary | ICD-10-CM | POA: Diagnosis not present

## 2015-07-20 DIAGNOSIS — M25551 Pain in right hip: Secondary | ICD-10-CM | POA: Diagnosis not present

## 2015-07-20 DIAGNOSIS — I4891 Unspecified atrial fibrillation: Secondary | ICD-10-CM | POA: Diagnosis not present

## 2015-07-20 DIAGNOSIS — Z7901 Long term (current) use of anticoagulants: Secondary | ICD-10-CM | POA: Diagnosis not present

## 2015-07-21 DIAGNOSIS — M25551 Pain in right hip: Secondary | ICD-10-CM | POA: Diagnosis not present

## 2015-07-21 DIAGNOSIS — I2510 Atherosclerotic heart disease of native coronary artery without angina pectoris: Secondary | ICD-10-CM | POA: Diagnosis not present

## 2015-07-21 DIAGNOSIS — I110 Hypertensive heart disease with heart failure: Secondary | ICD-10-CM | POA: Diagnosis not present

## 2015-07-21 DIAGNOSIS — E271 Primary adrenocortical insufficiency: Secondary | ICD-10-CM | POA: Diagnosis not present

## 2015-07-21 DIAGNOSIS — J441 Chronic obstructive pulmonary disease with (acute) exacerbation: Secondary | ICD-10-CM | POA: Diagnosis not present

## 2015-07-21 DIAGNOSIS — Z5181 Encounter for therapeutic drug level monitoring: Secondary | ICD-10-CM | POA: Diagnosis not present

## 2015-07-21 DIAGNOSIS — I4891 Unspecified atrial fibrillation: Secondary | ICD-10-CM | POA: Diagnosis not present

## 2015-07-21 DIAGNOSIS — Z7902 Long term (current) use of antithrombotics/antiplatelets: Secondary | ICD-10-CM | POA: Diagnosis not present

## 2015-07-21 DIAGNOSIS — Z7901 Long term (current) use of anticoagulants: Secondary | ICD-10-CM | POA: Diagnosis not present

## 2015-07-21 DIAGNOSIS — M25561 Pain in right knee: Secondary | ICD-10-CM | POA: Diagnosis not present

## 2015-07-21 DIAGNOSIS — Z9981 Dependence on supplemental oxygen: Secondary | ICD-10-CM | POA: Diagnosis not present

## 2015-07-21 DIAGNOSIS — I509 Heart failure, unspecified: Secondary | ICD-10-CM | POA: Diagnosis not present

## 2015-07-23 DIAGNOSIS — Z1389 Encounter for screening for other disorder: Secondary | ICD-10-CM | POA: Diagnosis not present

## 2015-07-23 DIAGNOSIS — E291 Testicular hypofunction: Secondary | ICD-10-CM | POA: Diagnosis not present

## 2015-07-23 DIAGNOSIS — S81811A Laceration without foreign body, right lower leg, initial encounter: Secondary | ICD-10-CM | POA: Diagnosis not present

## 2015-07-23 DIAGNOSIS — J9610 Chronic respiratory failure, unspecified whether with hypoxia or hypercapnia: Secondary | ICD-10-CM | POA: Diagnosis not present

## 2015-07-23 DIAGNOSIS — R58 Hemorrhage, not elsewhere classified: Secondary | ICD-10-CM | POA: Diagnosis not present

## 2015-07-24 DIAGNOSIS — E271 Primary adrenocortical insufficiency: Secondary | ICD-10-CM | POA: Diagnosis not present

## 2015-07-24 DIAGNOSIS — J441 Chronic obstructive pulmonary disease with (acute) exacerbation: Secondary | ICD-10-CM | POA: Diagnosis not present

## 2015-07-24 DIAGNOSIS — M25561 Pain in right knee: Secondary | ICD-10-CM | POA: Diagnosis not present

## 2015-07-24 DIAGNOSIS — I4891 Unspecified atrial fibrillation: Secondary | ICD-10-CM | POA: Diagnosis not present

## 2015-07-24 DIAGNOSIS — Z9981 Dependence on supplemental oxygen: Secondary | ICD-10-CM | POA: Diagnosis not present

## 2015-07-24 DIAGNOSIS — I2510 Atherosclerotic heart disease of native coronary artery without angina pectoris: Secondary | ICD-10-CM | POA: Diagnosis not present

## 2015-07-24 DIAGNOSIS — I110 Hypertensive heart disease with heart failure: Secondary | ICD-10-CM | POA: Diagnosis not present

## 2015-07-24 DIAGNOSIS — Z7901 Long term (current) use of anticoagulants: Secondary | ICD-10-CM | POA: Diagnosis not present

## 2015-07-24 DIAGNOSIS — Z7902 Long term (current) use of antithrombotics/antiplatelets: Secondary | ICD-10-CM | POA: Diagnosis not present

## 2015-07-24 DIAGNOSIS — M25551 Pain in right hip: Secondary | ICD-10-CM | POA: Diagnosis not present

## 2015-07-24 DIAGNOSIS — I509 Heart failure, unspecified: Secondary | ICD-10-CM | POA: Diagnosis not present

## 2015-07-24 DIAGNOSIS — Z5181 Encounter for therapeutic drug level monitoring: Secondary | ICD-10-CM | POA: Diagnosis not present

## 2015-07-27 ENCOUNTER — Other Ambulatory Visit: Payer: Self-pay | Admitting: *Deleted

## 2015-07-27 DIAGNOSIS — I714 Abdominal aortic aneurysm, without rupture, unspecified: Secondary | ICD-10-CM

## 2015-07-28 DIAGNOSIS — I509 Heart failure, unspecified: Secondary | ICD-10-CM | POA: Diagnosis not present

## 2015-07-28 DIAGNOSIS — J441 Chronic obstructive pulmonary disease with (acute) exacerbation: Secondary | ICD-10-CM | POA: Diagnosis not present

## 2015-07-28 DIAGNOSIS — M25551 Pain in right hip: Secondary | ICD-10-CM | POA: Diagnosis not present

## 2015-07-28 DIAGNOSIS — J449 Chronic obstructive pulmonary disease, unspecified: Secondary | ICD-10-CM | POA: Diagnosis not present

## 2015-07-28 DIAGNOSIS — I2510 Atherosclerotic heart disease of native coronary artery without angina pectoris: Secondary | ICD-10-CM | POA: Diagnosis not present

## 2015-07-28 DIAGNOSIS — I110 Hypertensive heart disease with heart failure: Secondary | ICD-10-CM | POA: Diagnosis not present

## 2015-07-28 DIAGNOSIS — Z9981 Dependence on supplemental oxygen: Secondary | ICD-10-CM | POA: Diagnosis not present

## 2015-07-28 DIAGNOSIS — Z5181 Encounter for therapeutic drug level monitoring: Secondary | ICD-10-CM | POA: Diagnosis not present

## 2015-07-28 DIAGNOSIS — Z7901 Long term (current) use of anticoagulants: Secondary | ICD-10-CM | POA: Diagnosis not present

## 2015-07-28 DIAGNOSIS — I4891 Unspecified atrial fibrillation: Secondary | ICD-10-CM | POA: Diagnosis not present

## 2015-07-28 DIAGNOSIS — E271 Primary adrenocortical insufficiency: Secondary | ICD-10-CM | POA: Diagnosis not present

## 2015-07-28 DIAGNOSIS — M25561 Pain in right knee: Secondary | ICD-10-CM | POA: Diagnosis not present

## 2015-07-28 DIAGNOSIS — Z7902 Long term (current) use of antithrombotics/antiplatelets: Secondary | ICD-10-CM | POA: Diagnosis not present

## 2015-07-29 ENCOUNTER — Ambulatory Visit (HOSPITAL_COMMUNITY): Payer: Medicare Other | Attending: Internal Medicine | Admitting: Physical Therapy

## 2015-07-29 DIAGNOSIS — X58XXXA Exposure to other specified factors, initial encounter: Secondary | ICD-10-CM | POA: Insufficient documentation

## 2015-07-29 DIAGNOSIS — I83028 Varicose veins of left lower extremity with ulcer other part of lower leg: Secondary | ICD-10-CM | POA: Insufficient documentation

## 2015-07-29 DIAGNOSIS — S81022A Laceration with foreign body, left knee, initial encounter: Secondary | ICD-10-CM | POA: Diagnosis not present

## 2015-07-29 DIAGNOSIS — R262 Difficulty in walking, not elsewhere classified: Secondary | ICD-10-CM | POA: Diagnosis not present

## 2015-07-29 DIAGNOSIS — I83012 Varicose veins of right lower extremity with ulcer of calf: Secondary | ICD-10-CM | POA: Insufficient documentation

## 2015-07-29 DIAGNOSIS — M25562 Pain in left knee: Secondary | ICD-10-CM | POA: Insufficient documentation

## 2015-07-29 DIAGNOSIS — L97829 Non-pressure chronic ulcer of other part of left lower leg with unspecified severity: Secondary | ICD-10-CM

## 2015-07-29 NOTE — Therapy (Signed)
[place], [place], [address] Phone: [phone]   Fax:  [phone]  Wound Care Evaluation  Patient Details  Name: [name] MRN: [id number] Date of Birth: [birth year] No Data Recorded  Encounter Date: 07/29/2015      PT End of Session - 07/29/15 1253    Visit Number 1   Number of Visits 24   Date for PT Re-Evaluation 08/28/15   Authorization Type [employer] medicare   Authorization - Visit Number 1   Authorization - Number of Visits 10   PT Start Time 0905   PT Stop Time 0945   PT Time Calculation (min) 40 min   Activity Tolerance Patient limited by pain      Past Medical History  Diagnosis Date  . Diabetes mellitus     type II  . Obesity   . Osteoarthritis   . Venous insufficiency     lower extremities  . Cholecystitis   . AAA (abdominal aortic aneurysm) ([place])     followed by Dr. Sherren Mocha Early  . Hyperlipidemia   . Clotting disorder ([place])     history of DVT  . Arthritis   . Lung nodules   . Aortic aneurysm without rupture ([place])   . Hypertension     Past Surgical History  Procedure Laterality Date  . Cholecystectomy    . Inguinal hernia repair    . Testicle surgery  2004    Dr. [name]  . Cataract extraction w/phaco  10/21/2010    Procedure: CATARACT EXTRACTION PHACO AND INTRAOCULAR LENS PLACEMENT (IOC);  Surgeon: Tonny Branch;  Location: [facility];  Service: Ophthalmology;  Laterality: Left;  CDE:16.80  . Yag laser application Right [id number]    Procedure: YAG LASER APPLICATION;  Surgeon: [name], MD;  Location: [facility];  Service: Ophthalmology;  Laterality: Right;    There were no vitals filed for this visit.         Wound Therapy - 07/29/15 1231    Subjective [name] states that he was dumping some gas in the woods behind his [place] when he fell and had a puncture wound of his left leg.  He has had a home health nurse coming out to his house but the wound is regressing rather than  improving.  He has now been referred to physical therapy for skilled care of his wound.   Patient and Family Stated Goals wound to heal    Date of Onset 06/30/15   Prior Treatments antibiotics, HH    Pain Assessment 0-10   Pain Score 6    Pain Type Chronic pain   Pain Location Leg   Pain Orientation Lower;Left   Pain Descriptors / Indicators Burning;Throbbing;Tightness   Pain Onset On-going   Patients Stated Pain Goal 0   Pain Intervention(s) Medication (See eMAR)   Evaluation and Treatment Procedures Explained to Patient/Family Yes   Evaluation and Treatment Procedures agreed to   Wound Properties Date First Assessed: 07/29/15 Time First Assessed: 0916 Wound Type: Puncture Location: Leg Location Orientation: Left;Lower Present on Admission: Yes   Dressing Type Gauze (Comment)  dressing changed to silverhydrofiber.   Dressing Changed Changed   Dressing Status Old drainage   Dressing Change Frequency Monday, Wednesday, Friday   Site / Wound Assessment Bleeding;Friable;Yellow   % Wound base Red or Granulating 0%   % Wound base Yellow 100%   Peri-wound Assessment Edema;Erythema (blanchable);Induration   Wound Length (cm) 4.5 cm   Wound  Width (cm) 2 cm   Wound Depth (cm) --  unknown   Tunneling (cm) superior and inferiorly unsure depth   Drainage Amount Copious  with pressure from inferior lateral aspect(with manual)   Drainage Description Odor   Treatment Cleansed;Debridement (Selective);Pressure applied   Selective Debridement - Location wound bed   Selective Debridement - Tools Used Forceps   Selective Debridement - Tissue Removed slough    Wound Therapy - Clinical Statement [name] is a 75 yo male who has had history of cellulitis, COPD, OA, edema of B LE which is probably lymphedema.  He has worn his compression stockings on a regular basis.  He was ambulating in his yard when he fell on his left leg causing a wound on  May 16th. The wound is deteriorating therefore he is  being referred to skilled physical therapy.  Examination shows, increased edema, foul smell, and a wound that has no granulation.  [name] will benefit from skilled physical therapy for pulse lavage, debridement and compression dressing.     Wound Therapy - Functional Problem List difficulty walking, pan    Factors Delaying/Impairing Wound Healing Infection - systemic/local;Immobility;Multiple medical problems;Polypharmacy;Vascular compromise   Hydrotherapy Plan Debridement;Dressing change;Patient/family education;Pulsatile lavage with suction   Wound Therapy - Frequency 3X / week   Wound Therapy - Current Recommendations PT   Wound Plan Begin pulse lavage, debridement including epiboled edges and dressing change   Dressing  silverhydrofiber, alginate followed by profore compression dressing                          PT Education - 07/29/15 1252    Education provided Yes   Education Details to take dressing off if it is painful to patient    Person(s) Educated Patient   Methods Explanation   Comprehension Verbalized understanding          PT Short Term Goals - 07/29/15 1255    PT SHORT TERM GOAL #1   Title Pt to be able to verbalize signs and symptoms of cellulitis as well as the importance to call MD to prevent a systemic infection   Time 1   Period Weeks   Status New   PT SHORT TERM GOAL #2   Title Pt pain to be decreased to no more than a 4/10 to allow pt to be able to walk for five minutes without increased pain    Time 4   Period Weeks   Status New   PT SHORT TERM GOAL #3   Title Wound bed to be 100% granulated to decrease risk of infection and allow closure of wound    Time 4   Period Weeks   Status New   PT SHORT TERM GOAL #4   Title drainage to be scant to show decreased local infection and prevent soiling of clothes    Time 4   Period Weeks   Status New           PT Long Term Goals - 07/29/15 1419    PT LONG TERM GOAL #1   Title Pt pain  to be no greater than a 1/10 to allow pt to walk for up to 10 minutes without having to take rest breaks and with no increased Lt leg pain    Time 8   Period Weeks   Status New   PT LONG TERM GOAL #2   Title wound size to have decreased by 3x1 cm with no  depth to allow pt and wife to be able to care for wound at home.    Time 8   Period Weeks   Status New   PT LONG TERM GOAL #3   Title Pt wound to have no drainage for reduced risk of infection    Time 8   Period Weeks   Status New   PT LONG TERM GOAL #4   Title Pt and wife to verbalize confidence in maintaning wound care at home until wound is completely healed.    Time 8   Period Weeks   Status New              Plan - 08/14/15 1254    Clinical Impression Statement see in wound sheet    Rehab Potential Good   PT Frequency 3x / week   PT Duration 8 weeks   PT Treatment/Interventions Manual lymph drainage;Compression bandaging;Manual techniques;Other (comment)  pulse lavage and debridement    PT Next Visit Plan begin pulse lavage, continue manual to decrease edema and debridement, dressing change    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 Chronic respiratory failure with hypoxia and hypercapnia ([place]) 12/11/2014  . Obesity hypoventilation syndrome ([place]) 12/11/2014  . Addisons disease ([place]) 12/11/2014  . Noncompliance with CPAP treatment 12/11/2014  . 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 Breath shortness 10/06/2014  . Snoring 07/01/2013  . Diaphoresis 07/01/2013  . Adrenal insufficiency (Addison's disease) ([place]) 07/01/2013  . Hypogonadism male 07/01/2013  . Hypoxemia 07/01/2013  . Lung nodules   . Aortic aneurysm without rupture ([place])   . AAA (abdominal aortic aneurysm) ([place])   . Hyperlipidemia   . Clotting disorder ([place])   . Arthritis   . Osteoarthritis   . Dyspnea on exertion 05/31/2010    Rayetta Humphrey, PT CLT [phone] 08-14-15, 2:26 PM  [facility] [address] [place], [place], [address] Phone: [phone]   Fax:  [phone]  Name: [name] MRN: [id number] Date of Birth: 07-Apr-1940

## 2015-07-30 ENCOUNTER — Ambulatory Visit (HOSPITAL_COMMUNITY): Payer: Medicare Other

## 2015-07-30 DIAGNOSIS — M25562 Pain in left knee: Secondary | ICD-10-CM

## 2015-07-30 DIAGNOSIS — R262 Difficulty in walking, not elsewhere classified: Secondary | ICD-10-CM | POA: Diagnosis not present

## 2015-07-30 DIAGNOSIS — S81022A Laceration with foreign body, left knee, initial encounter: Secondary | ICD-10-CM

## 2015-07-30 DIAGNOSIS — I83012 Varicose veins of right lower extremity with ulcer of calf: Secondary | ICD-10-CM | POA: Diagnosis not present

## 2015-07-30 DIAGNOSIS — I83028 Varicose veins of left lower extremity with ulcer other part of lower leg: Secondary | ICD-10-CM | POA: Diagnosis not present

## 2015-07-30 DIAGNOSIS — L97829 Non-pressure chronic ulcer of other part of left lower leg with unspecified severity: Secondary | ICD-10-CM

## 2015-07-30 NOTE — Therapy (Signed)
[place], [place], [address] Phone: [phone]   Fax:  [phone]  Wound Care Therapy  Patient Details  Name: [name] MRN: [id number] Date of Birth: 08-02-1940 No Data Recorded  Encounter Date: 07/30/2015      PT End of Session - 07/30/15 1755    Visit Number 2   Number of Visits 24   Date for PT Re-Evaluation 08/28/15   Authorization Type [employer] medicare   Authorization - Visit Number 2   Authorization - Number of Visits 10   PT Start Time [id number]   PT Stop Time 1740   PT Time Calculation (min) 47 min   Activity Tolerance Patient tolerated treatment well   Behavior During Therapy [facility] for tasks assessed/performed      Past Medical History  Diagnosis Date  . Diabetes mellitus     type II  . Obesity   . Osteoarthritis   . Venous insufficiency     lower extremities  . Cholecystitis   . AAA (abdominal aortic aneurysm) ([place])     followed by Dr. Sherren Mocha Early  . Hyperlipidemia   . Clotting disorder ([place])     history of DVT  . Arthritis   . Lung nodules   . Aortic aneurysm without rupture ([place])   . Hypertension     Past Surgical History  Procedure Laterality Date  . Cholecystectomy    . Inguinal hernia repair    . Testicle surgery  2004    Dr. [name]  . Cataract extraction w/phaco  10/21/2010    Procedure: CATARACT EXTRACTION PHACO AND INTRAOCULAR LENS PLACEMENT (IOC);  Surgeon: Tonny Branch;  Location: [facility];  Service: Ophthalmology;  Laterality: Left;  CDE:16.80  . Yag laser application Right [id number]    Procedure: YAG LASER APPLICATION;  Surgeon: [name], MD;  Location: [facility];  Service: Ophthalmology;  Laterality: Right;    There were no vitals filed for this visit.       Subjective Assessment - 07/30/15 1758    Subjective Pt entered with dressing intact, no reports of pain Lt LE.  Pt did c/o chronic LBP pain scale 3-4/10   Currently in Pain? Yes   Pain Score 4    Pain  Location Back   Pain Orientation Lower   Pain Descriptors / Indicators Aching   Pain Type Acute pain                   Wound Therapy - 07/30/15 1758    Subjective Pt entered with dressing intact, no reports of pain Lt LE.  Pt did c/o chronic LBP pain scale 3-4/10   Patient and Family Stated Goals wound to heal    Date of Onset 06/30/15   Prior Treatments antibiotics, HH    Pain Assessment 0-10   Pain Onset On-going   Patients Stated Pain Goal 0   Pain Intervention(s) Medication (See eMAR)   Evaluation and Treatment Procedures Explained to Patient/Family Yes   Evaluation and Treatment Procedures agreed to   Wound Properties Date First Assessed: 07/29/15 Time First Assessed: 0916 Wound Type: Puncture Location: Leg Location Orientation: Left;Lower Present on Admission: Yes   Dressing Type Silver hydrofiber;Alginate;Abdominal pads;Compression wrap  profore   Dressing Changed Changed   Dressing Status Old drainage   Dressing Change Frequency Monday, Wednesday, Friday   Site / Wound Assessment Bleeding;Friable;Yellow   % Wound base Red or Granulating 5%   % Wound base Yellow 95%  Drainage Amount Copious   Drainage Description Odor   Treatment Cleansed;Debridement (Selective);Hydrotherapy (Pulse lavage)   Pulsed lavage therapy - wound location wound bed Lt shin   Pulsed Lavage with Suction (psi) 4 psi   Pulsed Lavage with Suction - Normal Saline Used 1000 mL   Pulsed Lavage Tip Tip with splash shield   Selective Debridement - Location wound bed   Selective Debridement - Tools Used Forceps;Scalpel   Selective Debridement - Tissue Removed slough    Wound Therapy - Clinical Statement Began PLS for wound bed cleansing.  Selective debridement with focus on removing slough, noted slough adherent.to wound border.  Continued with silver hydrofiber, alginate and added ABD pad due to copious drainage amount.  Continued with profore compression for edema control.  No reports of pain in  LE through session.  Noted LE red and swollen, no heat noted.  Pt stated he just ended antibiotics.  Pt educated on self care at home and encouraged to keep dressings on until next session.     Wound Therapy - Functional Problem List difficulty walking, pan    Factors Delaying/Impairing Wound Healing Infection - systemic/local;Immobility;Multiple medical problems;Polypharmacy;Vascular compromise   Hydrotherapy Plan Debridement;Dressing change;Patient/family education;Pulsatile lavage with suction   Wound Therapy - Frequency 3X / week   Wound Therapy - Current Recommendations PT   Wound Plan Continue pulse lavage, debridement including epiboled edges and dressing change   Dressing  silverhydrofiber, alginate followed by profore compression dressing              PT Education - 07/29/15 1252    Education provided Yes   Education Details to take dressing off if it is painful to patient    Person(s) Educated Patient   Methods Explanation   Comprehension Verbalized understanding          PT Short Term Goals - 07/29/15 1255    PT SHORT TERM GOAL #1   Title Pt to be able to verbalize signs and symptoms of cellulitis as well as the importance to call MD to prevent a systemic infection   Time 1   Period Weeks   Status New   PT SHORT TERM GOAL #2   Title Pt pain to be decreased to no more than a 4/10 to allow pt to be able to walk for five minutes without increased pain    Time 4   Period Weeks   Status New   PT SHORT TERM GOAL #3   Title Wound bed to be 100% granulated to decrease risk of infection and allow closure of wound    Time 4   Period Weeks   Status New   PT SHORT TERM GOAL #4   Title drainage to be scant to show decreased local infection and prevent soiling of clothes    Time 4   Period Weeks   Status New           PT Long Term Goals - 07/29/15 1419    PT LONG TERM GOAL #1   Title Pt pain to be no greater than a 1/10 to allow pt to walk for up to 10 minutes  without having to take rest breaks and with no increased Lt leg pain    Time 8   Period Weeks   Status New   PT LONG TERM GOAL #2   Title wound size to have decreased by 3x1 cm with no depth to allow pt and wife to be able to care for wound at  home.    Time 8   Period Weeks   Status New   PT LONG TERM GOAL #3   Title Pt wound to have no drainage for reduced risk of infection    Time 8   Period Weeks   Status New   PT LONG TERM GOAL #4   Title Pt and wife to verbalize confidence in maintaning wound care at home until wound is completely healed.    Time 8   Period Weeks   Status New               Plan - 07/29/15 1254    Clinical Impression Statement see in wound sheet    Rehab Potential Good   PT Frequency 3x / week   PT Duration 8 weeks   PT Treatment/Interventions Manual lymph drainage;Compression bandaging;Manual techniques;Other (comment)  pulse lavage and debridement    PT Next Visit Plan begin pulse lavage, continue manual to decrease edema and debridement, dressing change    Consulted and Agree with Plan of Care Patient      Patient will benefit from skilled therapeutic intervention in order to improve the following deficits and impairments:     Visit Diagnosis: Laceration of left knee with foreign body, initial encounter  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 Chronic respiratory failure with hypoxia and hypercapnia ([place]) 12/11/2014  . Obesity hypoventilation syndrome ([place]) 12/11/2014  . Addisons disease ([place]) 12/11/2014  . Noncompliance with CPAP treatment 12/11/2014  . 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 Breath shortness 10/06/2014  . Snoring 07/01/2013  . Diaphoresis 07/01/2013  . Adrenal insufficiency (Addison's disease) ([place])  07/01/2013  . Hypogonadism male 07/01/2013  . Hypoxemia 07/01/2013  . Lung nodules   . Aortic aneurysm without rupture ([place])   . AAA (abdominal aortic aneurysm) ([place])   . Hyperlipidemia   . Clotting disorder ([place])   . Arthritis   . Osteoarthritis   . Dyspnea on exertion 05/31/2010   Ihor Austin, LPTA; CBIS [phone]  Aldona Lento 07/30/2015, 6:19 PM  [facility] [address] [place], [place], [address] Phone: [phone]   Fax:  [phone]  Name: [name] MRN: [id number] Date of Birth: Oct 25, 1940

## 2015-08-03 ENCOUNTER — Ambulatory Visit (HOSPITAL_COMMUNITY): Payer: Medicare Other | Admitting: Physical Therapy

## 2015-08-03 DIAGNOSIS — I83028 Varicose veins of left lower extremity with ulcer other part of lower leg: Secondary | ICD-10-CM

## 2015-08-03 DIAGNOSIS — R262 Difficulty in walking, not elsewhere classified: Secondary | ICD-10-CM

## 2015-08-03 DIAGNOSIS — M25562 Pain in left knee: Secondary | ICD-10-CM | POA: Diagnosis not present

## 2015-08-03 DIAGNOSIS — L97829 Non-pressure chronic ulcer of other part of left lower leg with unspecified severity: Secondary | ICD-10-CM

## 2015-08-03 DIAGNOSIS — I83012 Varicose veins of right lower extremity with ulcer of calf: Secondary | ICD-10-CM | POA: Diagnosis not present

## 2015-08-03 DIAGNOSIS — S81022A Laceration with foreign body, left knee, initial encounter: Secondary | ICD-10-CM

## 2015-08-03 NOTE — Therapy (Signed)
[place], [place], [address] Phone: [phone]   Fax:  [phone]  Wound Care Therapy  Patient Details  Name: [name] MRN: [id number] Date of Birth: September 26, 1940 No Data Recorded  Encounter Date: 08/03/2015      PT End of Session - 08/03/15 1548    Visit Number 3   Number of Visits 24   Date for PT Re-Evaluation 08/28/15   Authorization Type [employer] medicare   Authorization - Visit Number 3   Authorization - Number of Visits 10   Activity Tolerance Patient tolerated treatment well   Behavior During Therapy [facility] for tasks assessed/performed      Past Medical History  Diagnosis Date  . Diabetes mellitus     type II  . Obesity   . Osteoarthritis   . Venous insufficiency     lower extremities  . Cholecystitis   . AAA (abdominal aortic aneurysm) ([place])     followed by Dr. Sherren Mocha Early  . Hyperlipidemia   . Clotting disorder ([place])     history of DVT  . Arthritis   . Lung nodules   . Aortic aneurysm without rupture ([place])   . Hypertension     Past Surgical History  Procedure Laterality Date  . Cholecystectomy    . Inguinal hernia repair    . Testicle surgery  2004    Dr. [name]  . Cataract extraction w/phaco  10/21/2010    Procedure: CATARACT EXTRACTION PHACO AND INTRAOCULAR LENS PLACEMENT (IOC);  Surgeon: Tonny Branch;  Location: [facility];  Service: Ophthalmology;  Laterality: Left;  CDE:16.80  . Yag laser application Right [id number]    Procedure: YAG LASER APPLICATION;  Surgeon: [name], MD;  Location: [facility];  Service: Ophthalmology;  Laterality: Right;    There were no vitals filed for this visit.                  Wound Therapy - 08/03/15 1543    Subjective Pt entered with dressing intact, no reports of pain Lt LE.  Pt did c/o chronic LBP pain scale 3-4/10   Patient and Family Stated Goals wound to heal    Date of Onset 06/30/15   Prior Treatments antibiotics, HH    Pain  Assessment No/denies pain   Pain Score 0-No pain   Evaluation and Treatment Procedures Explained to Patient/Family Yes   Evaluation and Treatment Procedures agreed to   Wound Properties Date First Assessed: 07/29/15 Time First Assessed: 0916 Wound Type: Puncture Location: Leg Location Orientation: Left;Lower Present on Admission: Yes   Dressing Type Silver hydrofiber;Alginate;Abdominal pads;Compression wrap  profore   Dressing Changed Changed   Dressing Status Old drainage   Dressing Change Frequency Monday, Wednesday, Friday   Site / Wound Assessment Bleeding;Friable;Yellow   % Wound base Red or Granulating 10%   % Wound base Yellow 90%   Peri-wound Assessment Hemosiderin;Edema   Drainage Amount Copious   Drainage Description Odor   Treatment Cleansed;Debridement (Selective);Hydrotherapy (Pulse lavage);Other (Comment)   Pulsed lavage therapy - wound location wound bed Lt shin   Pulsed Lavage with Suction (psi) 4 psi   Pulsed Lavage with Suction - Normal Saline Used 1000 mL   Pulsed Lavage Tip Tip with splash shield   Selective Debridement - Location wound bed   Selective Debridement - Tools Used Forceps;Scalpel   Selective Debridement - Tissue Removed slough    Wound Therapy - Clinical Statement Pt LE appears purple in color  therapist asked pt if he had his INR checked lately pt states home health nurse was checking on weekly basis but she is not coming any longer.  Therapist stressed to call MD to see if he wants pt to go to the lab on a weekly basis.  Increased granulation noted but as pt was debriding tunneling was found in the lateral aspect of the wound.    Wound Therapy - Functional Problem List difficulty walking, pan    Factors Delaying/Impairing Wound Healing Infection - systemic/local;Immobility;Multiple medical problems;Polypharmacy;Vascular compromise   Hydrotherapy Plan Debridement;Dressing change;Patient/family education;Pulsatile lavage with suction   Wound Therapy -  Frequency 3X / week   Wound Therapy - Current Recommendations PT   Wound Plan Continue pulse lavage, debridement including epiboled edges and dressing change   Dressing  silverhydrofiber, alginate followed by profore compression dressing                    PT Short Term Goals - 08/03/15 1549    PT SHORT TERM GOAL #1   Title Pt to be able to verbalize signs and symptoms of cellulitis as well as the importance to call MD to prevent a systemic infection   Time 1   Period Weeks   Status Achieved   PT SHORT TERM GOAL #2   Title Pt pain to be decreased to no more than a 4/10 to allow pt to be able to walk for five minutes without increased pain    Time 4   Period Weeks   Status Achieved   PT SHORT TERM GOAL #3   Title Wound bed to be 100% granulated to decrease risk of infection and allow closure of wound    Time 4   Period Weeks   Status On-going   PT SHORT TERM GOAL #4   Title drainage to be scant to show decreased local infection and prevent soiling of clothes    Time 4   Period Weeks   Status On-going           PT Long Term Goals - 08/03/15 1549    PT LONG TERM GOAL #1   Title Pt pain to be no greater than a 1/10 to allow pt to walk for up to 10 minutes without having to take rest breaks and with no increased Lt leg pain    Time 8   Period Weeks   Status On-going   PT LONG TERM GOAL #2   Title wound size to have decreased by 3x1 cm with no depth to allow pt and wife to be able to care for wound at home.    Time 8   Period Weeks   Status On-going   PT LONG TERM GOAL #3   Title Pt wound to have no drainage for reduced risk of infection    Time 8   Period Weeks   Status On-going   PT LONG TERM GOAL #4   Title Pt and wife to verbalize confidence in maintaning wound care at home until wound is completely healed.    Time 8   Period Weeks   Status On-going               Plan - 08/03/15 1549    Rehab Potential Good   PT Frequency 3x / week   PT  Duration 8 weeks   PT Treatment/Interventions Manual lymph drainage;Compression bandaging;Manual techniques;Other (comment)  pulse lavage and debridement    PT Next Visit Plan begin pulse lavage,  continue manual to decrease edema and debridement, dressing change    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 Chronic respiratory failure with hypoxia and hypercapnia ([place]) 12/11/2014  . Obesity hypoventilation syndrome ([place]) 12/11/2014  . Addisons disease ([place]) 12/11/2014  . Noncompliance with CPAP treatment 12/11/2014  . 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 Breath shortness 10/06/2014  . Snoring 07/01/2013  . Diaphoresis 07/01/2013  . Adrenal insufficiency (Addison's disease) ([place]) 07/01/2013  . Hypogonadism male 07/01/2013  . Hypoxemia 07/01/2013  . Lung nodules   . Aortic aneurysm without rupture ([place])   . AAA (abdominal aortic aneurysm) ([place])   . Hyperlipidemia   . Clotting disorder ([place])   . Arthritis   . Osteoarthritis   . Dyspnea on exertion 05/31/2010    Rayetta Humphrey, PT CLT [phone] 08/03/2015, 3:50 PM  [facility] [address] [place], [place], [address] Phone: [phone]   Fax:  [phone]  Name: [name] MRN: [id number] Date of Birth: December 25, 1940

## 2015-08-05 ENCOUNTER — Ambulatory Visit (HOSPITAL_COMMUNITY): Payer: Medicare Other | Admitting: Physical Therapy

## 2015-08-05 DIAGNOSIS — I83012 Varicose veins of right lower extremity with ulcer of calf: Secondary | ICD-10-CM | POA: Diagnosis not present

## 2015-08-05 DIAGNOSIS — R262 Difficulty in walking, not elsewhere classified: Secondary | ICD-10-CM | POA: Diagnosis not present

## 2015-08-05 DIAGNOSIS — I83028 Varicose veins of left lower extremity with ulcer other part of lower leg: Secondary | ICD-10-CM

## 2015-08-05 DIAGNOSIS — M25562 Pain in left knee: Secondary | ICD-10-CM

## 2015-08-05 DIAGNOSIS — L97219 Non-pressure chronic ulcer of right calf with unspecified severity: Secondary | ICD-10-CM

## 2015-08-05 DIAGNOSIS — S81022A Laceration with foreign body, left knee, initial encounter: Secondary | ICD-10-CM | POA: Diagnosis not present

## 2015-08-05 DIAGNOSIS — L97829 Non-pressure chronic ulcer of other part of left lower leg with unspecified severity: Secondary | ICD-10-CM

## 2015-08-05 NOTE — Therapy (Addendum)
[place], [place], [address] Phone: [phone]   Fax:  [phone]  Physical Therapy Evaluation  Patient Details  Name: [name] MRN: [id number] Date of Birth: 1940-06-12 No Data Recorded  Encounter Date: 08/05/2015 New evaluation for Right LE wounds continued treatment for Left LE wound      PT End of Session - 08/05/15 1300    Visit Number 4   Number of Visits 24   Date for PT Re-Evaluation 08/28/15   Authorization Type [employer] medicare   Authorization - Visit Number 4   Authorization - Number of Visits 10   PT Start Time 0945   PT Stop Time 1100   PT Time Calculation (min) 75 min   Activity Tolerance Patient tolerated treatment well   Behavior During Therapy [facility] for tasks assessed/performed      Past Medical History  Diagnosis Date  . Diabetes mellitus     type II  . Obesity   . Osteoarthritis   . Venous insufficiency     lower extremities  . Cholecystitis   . AAA (abdominal aortic aneurysm) ([place])     followed by Dr. Sherren Mocha Early  . Hyperlipidemia   . Clotting disorder ([place])     history of DVT  . Arthritis   . Lung nodules   . Aortic aneurysm without rupture ([place])   . Hypertension     Past Surgical History  Procedure Laterality Date  . Cholecystectomy    . Inguinal hernia repair    . Testicle surgery  2004    Dr. [name]  . Cataract extraction w/phaco  10/21/2010    Procedure: CATARACT EXTRACTION PHACO AND INTRAOCULAR LENS PLACEMENT (IOC);  Surgeon: Tonny Branch;  Location: [facility];  Service: Ophthalmology;  Laterality: Left;  CDE:16.80  . Yag laser application Right [id number]    Procedure: YAG LASER APPLICATION;  Surgeon: [name], MD;  Location: [facility];  Service: Ophthalmology;  Laterality: Right;    There were no vitals filed for this visit.                     Wound Therapy - 08/05/15 1248    Subjective Pt to department with referral for evaluation on Rt LE  wound; we are already seeing him for his Lt LE wound.  Pt states that his right  wound has been draining significantly to the point that his pants are soaked. His Left wound is feeling better    Patient and Family Stated Goals wound to heal    Date of Onset 06/30/15  5/16 for Lt leg; 4/30 for RT leg    Prior Treatments antibiotics, HH   Lt Leg self dressing change    Pain Assessment No/denies pain   Pain Score 0-No pain   Evaluation and Treatment Procedures Explained to Patient/Family Yes   Evaluation and Treatment Procedures agreed to   Wound Properties Date First Assessed: 07/29/15 Time First Assessed: 0916 Wound Type: Puncture Location: Leg Location Orientation: Left;Lower Present on Admission: Yes   Dressing Type Silver hydrofiber;Alginate;Abdominal pads;Compression wrap  profore   Dressing Changed Changed   Dressing Status Old drainage   Dressing Change Frequency Monday, Wednesday, Friday   Site / Wound Assessment Bleeding;Friable;Yellow   % Wound base Red or Granulating 30%   % Wound base Yellow 70%   Peri-wound Assessment Hemosiderin;Edema   Drainage Amount Moderate   Drainage Description No odor   Treatment Cleansed;Debridement (Selective);Hydrotherapy (Pulse lavage)  Wound Properties Date First Assessed: 08/05/15 Time First Assessed: 1039 Wound Type: Other (Comment) Location: Leg Location Orientation: Right;Medial Wound Description (Comments): multiple small openings that are weeping  Present on Admission: Yes   Dressing Type Compression wrap   Dressing Changed New   Dressing Change Frequency Monday, Wednesday, Friday   Site / Wound Assessment Dry;Granulation tissue;Red   % Wound base Red or Granulating 100%   % Wound base Yellow 0%   Peri-wound Assessment Edema;Induration;Hemosiderin   Wound Length (cm) --  multiple small openings   Drainage Amount Copious   Drainage Description Serous;No odor   Treatment Cleansed;Other (Comment)   Pulsed lavage therapy - wound location  wound bed Lt shin   Pulsed Lavage with Suction (psi) 4 psi   Pulsed Lavage with Suction - Normal Saline Used 1000 mL   Pulsed Lavage Tip Tip with splash shield   Selective Debridement - Location wound bed of Lt LE   Selective Debridement - Tools Used Forceps;Scalpel   Selective Debridement - Tissue Removed slough    Wound Therapy - Clinical Statement Pt to department with new order to evaluate his Lt LE.  Lt medial LE has multiple opening that is constantly draining clear fluid.  B LE remain indurated and purple in color.  Pt states that his legs are always this color.  Pt Lt LE wound has significant increased granualtion this session.  Pt will continue to benefit from skilled PT for pulse lavage and debridement followed by multilayer compression dressing for Lt LE>  Rt LE will need multilayer compression bandaging.    Wound Therapy - Functional Problem List difficulty walking, pan    Factors Delaying/Impairing Wound Healing Infection - systemic/local;Immobility;Multiple medical problems;Polypharmacy;Vascular compromise   Hydrotherapy Plan Debridement;Dressing change;Patient/family education;Pulsatile lavage with suction   Wound Therapy - Frequency 3X / week for 7 more weeks for a total of 24 visits    Wound Therapy - Current Recommendations PT   Wound Plan Multilayer compression dressing for bilateral LE.   Continue pulse lavage, debridement including epiboled edges on Lt LE.   Dressing  Rt: profore lite:  Left silverhydrofiber, Ab pad followed by profore lite,(due to profore not being available.              PT Short Term Goals - 08/05/15 1300    PT SHORT TERM GOAL #1   Title Pt to be able to verbalize signs and symptoms of cellulitis as well as the importance to call MD to prevent a systemic infection   Time 1   Period Weeks   Status Achieved   PT SHORT TERM GOAL #2   Title Pt pain to be decreased to no more than a 4/10 to allow pt to be able to walk for five minutes without  increased pain    Time 4   Period Weeks   Status Achieved   PT SHORT TERM GOAL #3   Title Wound bed to be 100% granulated to decrease risk of infection and allow closure of wound    Time 4   Period Weeks   Status On-going   PT SHORT TERM GOAL #4   Title drainage to be scant to show decreased local infection and prevent soiling of clothes    Time 4   Period Weeks   Status On-going   PT SHORT TERM GOAL #5   Title Pt to have scant drainage from Rt LE to prevent soiling of clothes and decrease risk of infection    Time 1  Period Weeks   Status New           PT Long Term Goals - 08/05/15 1302    PT LONG TERM GOAL #1   Title Pt pain to be no greater than a 1/10 to allow pt to walk for up to 10 minutes without having to take rest breaks and with no increased Lt leg pain    Time 8   Period Weeks   Status On-going   PT LONG TERM GOAL #2   Title wound size to have decreased by 3x1 cm with no depth to allow pt and wife to be able to care for wound at home.    Time 8   Period Weeks   Status On-going   PT LONG TERM GOAL #3   Title Pt wound to have no drainage for reduced risk of infection    Time 8   Period Weeks   Status On-going   PT LONG TERM GOAL #4   Title Pt and wife to verbalize confidence in maintaning wound care at home until wound is completely healed.    Time 8   Period Weeks   Status On-going   PT LONG TERM GOAL #5   Title Pt Rt LE to have no drainage to allow pt to begin wearing his juxtafit again    Time 3   Period Weeks   Status New      [id number] [id number]         Plan - 08/05/15 1300    Clinical Impression Statement see above    Rehab Potential Good   PT Frequency 3x / week   PT Duration 8 weeks   PT Treatment/Interventions Manual lymph drainage;Compression bandaging;Manual techniques;Other (comment)  pulse lavage and debridement    PT Next Visit Plan begin pulse lavage, continue manual to decrease edema and debridement, dressing  change    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 Chronic respiratory failure with hypoxia and hypercapnia ([place]) 12/11/2014  . Obesity hypoventilation syndrome ([place]) 12/11/2014  . Addisons disease ([place]) 12/11/2014  . Noncompliance with CPAP treatment 12/11/2014  . 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 Breath shortness 10/06/2014  . Snoring 07/01/2013  . Diaphoresis 07/01/2013  . Adrenal insufficiency (Addison's disease) ([place]) 07/01/2013  . Hypogonadism male 07/01/2013  . Hypoxemia 07/01/2013  . Lung nodules   . Aortic aneurysm without rupture ([place])   . AAA (abdominal aortic aneurysm) ([place])   . Hyperlipidemia   . Clotting disorder ([place])   . Arthritis   . Osteoarthritis   . Dyspnea on exertion 05/31/2010    Rayetta Humphrey, PT CLT [phone] 08/05/2015, 2:05 PM  [facility] [address] [place], [place], [address] Phone: [phone]   Fax:  [phone]  Name: [name] MRN: [id number] Date of Birth: 1940-05-26

## 2015-08-07 ENCOUNTER — Ambulatory Visit (HOSPITAL_COMMUNITY): Payer: Medicare Other | Admitting: Physical Therapy

## 2015-08-07 DIAGNOSIS — M25562 Pain in left knee: Secondary | ICD-10-CM

## 2015-08-07 DIAGNOSIS — I83012 Varicose veins of right lower extremity with ulcer of calf: Secondary | ICD-10-CM

## 2015-08-07 DIAGNOSIS — R262 Difficulty in walking, not elsewhere classified: Secondary | ICD-10-CM | POA: Diagnosis not present

## 2015-08-07 DIAGNOSIS — I83028 Varicose veins of left lower extremity with ulcer other part of lower leg: Secondary | ICD-10-CM

## 2015-08-07 DIAGNOSIS — S81022A Laceration with foreign body, left knee, initial encounter: Secondary | ICD-10-CM | POA: Diagnosis not present

## 2015-08-07 DIAGNOSIS — L97829 Non-pressure chronic ulcer of other part of left lower leg with unspecified severity: Secondary | ICD-10-CM

## 2015-08-07 DIAGNOSIS — L97219 Non-pressure chronic ulcer of right calf with unspecified severity: Secondary | ICD-10-CM

## 2015-08-07 NOTE — Therapy (Signed)
[place], [place], [address] Phone: [phone]   Fax:  [phone]  Wound Care Therapy  Patient Details  Name: [name] MRN: [id number] Date of Birth: [birth year] No Data Recorded  Encounter Date: 08/07/2015      PT End of Session - 08/07/15 1214    Visit Number 5   Number of Visits 24   Date for PT Re-Evaluation 08/28/15   Authorization Type [employer] medicare   Authorization - Visit Number 5   Authorization - Number of Visits 10   Activity Tolerance Patient tolerated treatment well   Behavior During Therapy [facility] for tasks assessed/performed      Past Medical History  Diagnosis Date  . Diabetes mellitus     type II  . Obesity   . Osteoarthritis   . Venous insufficiency     lower extremities  . Cholecystitis   . AAA (abdominal aortic aneurysm) ([place])     followed by Dr. Sherren Mocha Early  . Hyperlipidemia   . Clotting disorder ([place])     history of DVT  . Arthritis   . Lung nodules   . Aortic aneurysm without rupture ([place])   . Hypertension     Past Surgical History  Procedure Laterality Date  . Cholecystectomy    . Inguinal hernia repair    . Testicle surgery  2004    Dr. [name]  . Cataract extraction w/phaco  10/21/2010    Procedure: CATARACT EXTRACTION PHACO AND INTRAOCULAR LENS PLACEMENT (IOC);  Surgeon: Tonny Branch;  Location: [facility];  Service: Ophthalmology;  Laterality: Left;  CDE:16.80  . Yag laser application Right [id number]    Procedure: YAG LASER APPLICATION;  Surgeon: [name], MD;  Location: [facility];  Service: Ophthalmology;  Laterality: Right;    There were no vitals filed for this visit.                  Wound Therapy - 08/07/15 1207    Subjective Pt states that he has been walking a little more to try and maintain his strength.     Patient and Family Stated Goals wound to heal    Date of Onset 06/30/15  5/16 for Lt leg; 4/30 for RT leg    Prior Treatments  antibiotics, HH   Lt Leg self dressing change    Pain Assessment No/denies pain   Evaluation and Treatment Procedures Explained to Patient/Family Yes   Evaluation and Treatment Procedures agreed to   Wound Properties Date First Assessed: 07/29/15 Time First Assessed: 0916 Wound Type: Puncture Location: Leg Location Orientation: Left;Lower Present on Admission: Yes   Dressing Type Silver hydrofiber;Alginate;Abdominal pads;Compression wrap  profore   Dressing Changed Changed   Dressing Status Old drainage   Dressing Change Frequency Monday, Wednesday, Friday   Site / Wound Assessment Bleeding;Friable;Yellow   % Wound base Red or Granulating 40%   % Wound base Yellow 60%   Peri-wound Assessment Hemosiderin;Edema   Drainage Amount Moderate   Drainage Description No odor   Wound Properties Date First Assessed: 08/05/15 Time First Assessed: 1039 Wound Type: Other (Comment) Location: Leg Location Orientation: Right;Medial Wound Description (Comments): multiple small openings that are weeping  Present on Admission: Yes   Dressing Type Alginate;Compression wrap   Dressing Changed Changed   Dressing Status Old drainage   Dressing Change Frequency Monday, Wednesday, Friday   Site / Wound Assessment Dry;Granulation tissue;Red   % Wound base Red or Granulating 100%   %  Wound base Yellow 0%   Peri-wound Assessment Edema;Induration;Hemosiderin   Drainage Amount Moderate   Drainage Description Serous;No odor   Treatment Cleansed;Other (Comment)  manual decongestive techniques to decrease swelling    Pulsed lavage therapy - wound location wound bed Lt shin   Pulsed Lavage with Suction (psi) 4 psi   Pulsed Lavage with Suction - Normal Saline Used 1000 mL   Pulsed Lavage Tip Tip with splash shield   Selective Debridement - Location wound bed of Lt LE   Selective Debridement - Tools Used Forceps;Scalpel   Selective Debridement - Tissue Removed slough    Wound Therapy - Clinical Statement Pt has  decreased drainage from Rt LE and increased granulation on Lt LE.  Pt will continue to need skilled physcial therapy to promote wound healing environment and decrease risk of infection    Wound Therapy - Functional Problem List difficulty walking, pan    Factors Delaying/Impairing Wound Healing Infection - systemic/local;Immobility;Multiple medical problems;Polypharmacy;Vascular compromise   Hydrotherapy Plan Debridement;Dressing change;Patient/family education;Pulsatile lavage with suction   Wound Therapy - Frequency 3X / week   Wound Therapy - Current Recommendations PT   Wound Plan Multilayer compression dressing for bilateral LE.   Continue pulse lavage, debridement including epiboled edges on Lt LE.   Dressing  Rt: profore lite:  Left silverhydrofiber, Ab pad followed by profore lite,(due to profore not being available.                    PT Short Term Goals - 08/07/15 1215    PT SHORT TERM GOAL #1   Title Pt to be able to verbalize signs and symptoms of cellulitis as well as the importance to call MD to prevent a systemic infection   Time 1   Period Weeks   Status Achieved   PT SHORT TERM GOAL #2   Title Pt pain to be decreased to no more than a 4/10 to allow pt to be able to walk for five minutes without increased pain    Time 4   Period Weeks   Status Achieved   PT SHORT TERM GOAL #3   Title Wound bed to be 100% granulated to decrease risk of infection and allow closure of wound    Time 4   Period Weeks   Status On-going   PT SHORT TERM GOAL #4   Title drainage to be scant to show decreased local infection and prevent soiling of clothes    Time 4   Period Weeks   Status On-going   PT SHORT TERM GOAL #5   Title Pt to have scant drainage from Rt LE to prevent soiling of clothes and decrease risk of infection    Time 1   Period Weeks   Status New           PT Long Term Goals - 08/07/15 1215    PT LONG TERM GOAL #1   Title Pt pain to be no greater than a  1/10 to allow pt to walk for up to 10 minutes without having to take rest breaks and with no increased Lt leg pain    Time 8   Period Weeks   Status On-going   PT LONG TERM GOAL #2   Title wound size to have decreased by 3x1 cm with no depth to allow pt and wife to be able to care for wound at home.    Time 8   Period Weeks   Status On-going  PT LONG TERM GOAL #3   Title Pt wound to have no drainage for reduced risk of infection    Time 8   Period Weeks   Status On-going   PT LONG TERM GOAL #4   Title Pt and wife to verbalize confidence in maintaning wound care at home until wound is completely healed.    Time 8   Period Weeks   Status On-going   PT LONG TERM GOAL #5   Title Pt Rt LE to have no drainage to allow pt to begin wearing his juxtafit again    Time 3   Period Weeks   Status New               Plan - 08/07/15 1215    Rehab Potential Good   PT Frequency 3x / week   PT Duration 8 weeks   PT Treatment/Interventions Manual lymph drainage;Compression bandaging;Manual techniques;Other (comment)  pulse lavage and debridement    PT Next Visit Plan  pulse lavage, continue manual to decrease edema and debridement, dressing change    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 Chronic respiratory failure with hypoxia and hypercapnia ([place]) 12/11/2014  . Obesity hypoventilation syndrome ([place]) 12/11/2014   . Addisons disease ([place]) 12/11/2014  . Noncompliance with CPAP treatment 12/11/2014  . 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 Breath shortness 10/06/2014  . Snoring 07/01/2013  . Diaphoresis 07/01/2013  . Adrenal insufficiency (Addison's disease) ([place]) 07/01/2013  . Hypogonadism male 07/01/2013  . Hypoxemia 07/01/2013  . Lung nodules   . Aortic aneurysm without rupture ([place])   . AAA (abdominal aortic aneurysm) ([place])   . Hyperlipidemia   . Clotting disorder ([place])   . Arthritis   . Osteoarthritis   . Dyspnea on exertion 05/31/2010    Rayetta Humphrey, PT CLT [phone] 08/07/2015, 12:16 PM  [facility] [address] [place], [place], [address] Phone: [phone]   Fax:  [phone]  Name: [name] MRN: [id number] Date of Birth: 1941-01-07

## 2015-08-10 ENCOUNTER — Ambulatory Visit (HOSPITAL_COMMUNITY): Payer: Medicare Other | Admitting: Physical Therapy

## 2015-08-10 DIAGNOSIS — I83012 Varicose veins of right lower extremity with ulcer of calf: Secondary | ICD-10-CM

## 2015-08-10 DIAGNOSIS — M25562 Pain in left knee: Secondary | ICD-10-CM

## 2015-08-10 DIAGNOSIS — R262 Difficulty in walking, not elsewhere classified: Secondary | ICD-10-CM

## 2015-08-10 DIAGNOSIS — E291 Testicular hypofunction: Secondary | ICD-10-CM | POA: Diagnosis not present

## 2015-08-10 DIAGNOSIS — Z1389 Encounter for screening for other disorder: Secondary | ICD-10-CM | POA: Diagnosis not present

## 2015-08-10 DIAGNOSIS — L97219 Non-pressure chronic ulcer of right calf with unspecified severity: Secondary | ICD-10-CM

## 2015-08-10 DIAGNOSIS — L97829 Non-pressure chronic ulcer of other part of left lower leg with unspecified severity: Secondary | ICD-10-CM

## 2015-08-10 DIAGNOSIS — I83028 Varicose veins of left lower extremity with ulcer other part of lower leg: Secondary | ICD-10-CM | POA: Diagnosis not present

## 2015-08-10 DIAGNOSIS — S81022A Laceration with foreign body, left knee, initial encounter: Secondary | ICD-10-CM | POA: Diagnosis not present

## 2015-08-10 NOTE — Therapy (Signed)
[place], [place], [address] Phone: [phone]   Fax:  [phone]  Wound Care Therapy  Patient Details  Name: [name] MRN: [id number] Date of Birth: 1940/04/25 No Data Recorded  Encounter Date: 08/10/2015      PT End of Session - 08/10/15 1609    Visit Number 6   Number of Visits 24   Date for PT Re-Evaluation 08/28/15   Authorization Type [employer] medicare   Authorization - Visit Number 6   Authorization - Number of Visits 10   PT Start Time 1300   PT Stop Time 1430   PT Time Calculation (min) 90 min   Activity Tolerance Patient tolerated treatment well   Behavior During Therapy [facility] for tasks assessed/performed      Past Medical History  Diagnosis Date  . Diabetes mellitus     type II  . Obesity   . Osteoarthritis   . Venous insufficiency     lower extremities  . Cholecystitis   . AAA (abdominal aortic aneurysm) ([place])     followed by Dr. Sherren Mocha Early  . Hyperlipidemia   . Clotting disorder ([place])     history of DVT  . Arthritis   . Lung nodules   . Aortic aneurysm without rupture ([place])   . Hypertension     Past Surgical History  Procedure Laterality Date  . Cholecystectomy    . Inguinal hernia repair    . Testicle surgery  2004    Dr. [name]  . Cataract extraction w/phaco  10/21/2010    Procedure: CATARACT EXTRACTION PHACO AND INTRAOCULAR LENS PLACEMENT (IOC);  Surgeon: Tonny Branch;  Location: [facility];  Service: Ophthalmology;  Laterality: Left;  CDE:16.80  . Yag laser application Right [id number]    Procedure: YAG LASER APPLICATION;  Surgeon: [name], MD;  Location: [facility];  Service: Ophthalmology;  Laterality: Right;    There were no vitals filed for this visit.                  Wound Therapy - 08/10/15 1603    Subjective Pt states that he has no energy today.   Patient and Family Stated Goals wound to heal    Date of Onset 06/30/15  5/16 for Lt leg; 4/30 for RT  leg    Prior Treatments antibiotics, HH   Lt Leg self dressing change    Pain Assessment No/denies pain   Evaluation and Treatment Procedures Explained to Patient/Family Yes   Evaluation and Treatment Procedures agreed to   Wound Properties Date First Assessed: 07/29/15 Time First Assessed: 0916 Wound Type: Puncture Location: Leg Location Orientation: Left;Lower Present on Admission: Yes   Dressing Type Silver hydrofiber;Alginate;Abdominal pads;Compression wrap  profore   Dressing Changed Changed   Dressing Status Old drainage   Dressing Change Frequency Monday, Wednesday, Friday   Site / Wound Assessment Bleeding;Friable;Yellow   % Wound base Red or Granulating 40%   % Wound base Yellow 60%   Peri-wound Assessment Hemosiderin;Edema   Drainage Amount Copious  increased drainage with pressure on lateral LE.   Drainage Description Serosanguineous;No odor   Treatment Cleansed;Debridement (Selective);Hydrotherapy (Pulse lavage);Other (Comment)   Wound Properties Date First Assessed: 08/05/15 Time First Assessed: 1039 Wound Type: Other (Comment) Location: Leg Location Orientation: Right;Medial Wound Description (Comments): multiple small openings that are weeping  Present on Admission: Yes   Dressing Type Alginate;Compression wrap   Dressing Changed Changed   Dressing Status Old drainage  Dressing Change Frequency Monday, Wednesday, Friday   Site / Wound Assessment Dry;Granulation tissue;Red   % Wound base Red or Granulating 100%   % Wound base Yellow 0%   Peri-wound Assessment Hemosiderin;Induration   Wound Length (cm) --  only two small wounds left; anticipate discharging Rt LE    Drainage Amount Minimal   Drainage Description Serous;No odor   Treatment Cleansed;Other (Comment)  manual decongestive techniques to bilateral LE to reduce induration prior to bandaging.     Pulsed lavage therapy - wound location wound bed Lt shin   Pulsed Lavage with Suction (psi) 4 psi   Pulsed Lavage  with Suction - Normal Saline Used 1000 mL   Pulsed Lavage Tip Tip with splash shield   Selective Debridement - Location wound bed of Lt LE   Selective Debridement - Tools Used Forceps;Scalpel   Selective Debridement - Tissue Removed slough    Wound Therapy - Clinical Statement Pt has increased drainage from wound on Lt LE.  Drainage is serosanguious in nature.  Pt has not had his INR checked in two weeks therapist highly recommended that he does so to ensure no active bleeding    Wound Therapy - Functional Problem List difficulty walking, pan    Factors Delaying/Impairing Wound Healing Infection - systemic/local;Immobility;Multiple medical problems;Polypharmacy;Vascular compromise   Hydrotherapy Plan Debridement;Dressing change;Patient/family education;Pulsatile lavage with suction   Wound Therapy - Frequency 3X / week   Wound Therapy - Current Recommendations PT   Wound Plan Multilayer compression dressing for bilateral LE.   Continue pulse lavage, debridement including epiboled edges on Lt LE.   Dressing  Rt silverhydrofiber, profore; Lt : calcium alginate followed by profore.                    PT Short Term Goals - 08/10/15 1610    PT SHORT TERM GOAL #1   Title Pt to be able to verbalize signs and symptoms of cellulitis as well as the importance to call MD to prevent a systemic infection   Time 1   Period Weeks   Status Achieved   PT SHORT TERM GOAL #2   Title Pt pain to be decreased to no more than a 4/10 to allow pt to be able to walk for five minutes without increased pain    Time 4   Period Weeks   Status Achieved   PT SHORT TERM GOAL #3   Title Wound bed to be 100% granulated to decrease risk of infection and allow closure of wound    Time 4   Period Weeks   Status On-going   PT SHORT TERM GOAL #4   Title drainage to be scant to show decreased local infection and prevent soiling of clothes    Time 4   Period Weeks   Status On-going   PT SHORT TERM GOAL #5    Title Pt to have scant drainage from Rt LE to prevent soiling of clothes and decrease risk of infection    Time 1   Period Weeks   Status New           PT Long Term Goals - 08/10/15 1610    PT LONG TERM GOAL #1   Title Pt pain to be no greater than a 1/10 to allow pt to walk for up to 10 minutes without having to take rest breaks and with no increased Lt leg pain    Time 8   Period Weeks   Status On-going  PT LONG TERM GOAL #2   Title wound size to have decreased by 3x1 cm with no depth to allow pt and wife to be able to care for wound at home.    Time 8   Period Weeks   Status On-going   PT LONG TERM GOAL #3   Title Pt wound to have no drainage for reduced risk of infection    Time 8   Period Weeks   Status On-going   PT LONG TERM GOAL #4   Title Pt and wife to verbalize confidence in maintaning wound care at home until wound is completely healed.    Time 8   Period Weeks   Status On-going   PT LONG TERM GOAL #5   Title Pt Rt LE to have no drainage to allow pt to begin wearing his juxtafit again    Time 3   Period Weeks   Status New               Plan - 08/10/15 1609    Rehab Potential Good   PT Frequency 3x / week   PT Duration 8 weeks   PT Treatment/Interventions Manual lymph drainage;Compression bandaging;Manual techniques;Other (comment)  pulse lavage and debridement    PT Next Visit Plan begin pulse lavage, continue manual to decrease edema and debridement, dressing change    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 Chronic respiratory failure with hypoxia and hypercapnia ([place]) 12/11/2014  . Obesity hypoventilation syndrome ([place]) 12/11/2014  . Addisons disease ([place]) 12/11/2014  . Noncompliance with CPAP treatment 12/11/2014  . 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 Breath shortness 10/06/2014  . Snoring 07/01/2013  . Diaphoresis 07/01/2013  . Adrenal insufficiency (Addison's disease) ([place]) 07/01/2013  . Hypogonadism male 07/01/2013  . Hypoxemia 07/01/2013  . Lung nodules   . Aortic aneurysm without rupture ([place])   . AAA (abdominal aortic aneurysm) ([place])   . Hyperlipidemia   . Clotting disorder ([place])   . Arthritis   . Osteoarthritis   . Dyspnea on exertion 05/31/2010    Rayetta Humphrey, PT CLT [phone] 08/10/2015, 4:11 PM  [facility] [address] [place], [place], [address] Phone: [phone]   Fax:  [phone]  Name: SRUJAN VALERA MRN: [id number] Date of Birth: 06-08-1940

## 2015-08-12 ENCOUNTER — Ambulatory Visit (HOSPITAL_COMMUNITY): Payer: Medicare Other | Admitting: Physical Therapy

## 2015-08-12 DIAGNOSIS — M25562 Pain in left knee: Secondary | ICD-10-CM

## 2015-08-12 DIAGNOSIS — S81022A Laceration with foreign body, left knee, initial encounter: Secondary | ICD-10-CM | POA: Diagnosis not present

## 2015-08-12 DIAGNOSIS — Z86718 Personal history of other venous thrombosis and embolism: Secondary | ICD-10-CM | POA: Diagnosis not present

## 2015-08-12 DIAGNOSIS — I83012 Varicose veins of right lower extremity with ulcer of calf: Secondary | ICD-10-CM

## 2015-08-12 DIAGNOSIS — I83028 Varicose veins of left lower extremity with ulcer other part of lower leg: Secondary | ICD-10-CM | POA: Diagnosis not present

## 2015-08-12 DIAGNOSIS — R262 Difficulty in walking, not elsewhere classified: Secondary | ICD-10-CM

## 2015-08-12 DIAGNOSIS — L97219 Non-pressure chronic ulcer of right calf with unspecified severity: Secondary | ICD-10-CM

## 2015-08-12 DIAGNOSIS — L97829 Non-pressure chronic ulcer of other part of left lower leg with unspecified severity: Secondary | ICD-10-CM

## 2015-08-12 NOTE — Therapy (Signed)
[place], [place], [address] Phone: [phone]   Fax:  [phone]  Wound Care Therapy  Patient Details  Name: [name] MRN: [id number] Date of Birth: 09/10/1940 No Data Recorded  Encounter Date: 08/12/2015      PT End of Session - 08/12/15 1444    Visit Number 7   Number of Visits 24   Date for PT Re-Evaluation 08/28/15   Authorization Type [employer] medicare   Authorization - Visit Number 7   Authorization - Number of Visits 10   Activity Tolerance Patient tolerated treatment well   Behavior During Therapy [facility] for tasks assessed/performed      Past Medical History  Diagnosis Date  . Diabetes mellitus     type II  . Obesity   . Osteoarthritis   . Venous insufficiency     lower extremities  . Cholecystitis   . AAA (abdominal aortic aneurysm) ([place])     followed by Dr. Sherren Mocha Early  . Hyperlipidemia   . Clotting disorder ([place])     history of DVT  . Arthritis   . Lung nodules   . Aortic aneurysm without rupture ([place])   . Hypertension     Past Surgical History  Procedure Laterality Date  . Cholecystectomy    . Inguinal hernia repair    . Testicle surgery  2004    Dr. [name]  . Cataract extraction w/phaco  10/21/2010    Procedure: CATARACT EXTRACTION PHACO AND INTRAOCULAR LENS PLACEMENT (IOC);  Surgeon: Tonny Branch;  Location: [facility];  Service: Ophthalmology;  Laterality: Left;  CDE:16.80  . Yag laser application Right [id number]    Procedure: YAG LASER APPLICATION;  Surgeon: [name], MD;  Location: [facility];  Service: Ophthalmology;  Laterality: Right;    There were no vitals filed for this visit.                  Wound Therapy - 08/12/15 1432    Subjective Pt states that he has no pain    Patient and Family Stated Goals wound to heal    Date of Onset 06/30/15  5/16 for Lt leg; 4/30 for RT leg    Prior Treatments antibiotics, HH   Lt Leg self dressing change    Pain  Assessment No/denies pain   Evaluation and Treatment Procedures Explained to Patient/Family Yes   Evaluation and Treatment Procedures agreed to   Wound Properties Date First Assessed: 08/05/15 Time First Assessed: 1039 Wound Type: Other (Comment) Location: Leg Location Orientation: Right;Medial Wound Description (Comments): multiple small openings that are weeping  Present on Admission: Yes Final Assessment Date: 08/12/15 Final Assessment Time: 1500   Treatment Cleansed  lotion with donning of compression garment    Wound Properties Date First Assessed: 07/29/15 Time First Assessed: 0916 Wound Type: Puncture Location: Leg Location Orientation: Left;Lower Present on Admission: Yes   Dressing Type Silver hydrofiber;Alginate;Abdominal pads;Compression wrap  profore   Dressing Changed Changed   Dressing Status Old drainage   Dressing Change Frequency Monday, Wednesday, Friday   Site / Wound Assessment Bleeding;Friable;Yellow   % Wound base Red or Granulating 50%   % Wound base Yellow 50%   Peri-wound Assessment Hemosiderin;Edema   Wound Length (cm) 5 cm   Wound Width (cm) 2.7 cm   Wound Depth (cm) 1.5 cm   Tunneling (cm) --  superior .7cm; medial .3; lateral 1.2;;at 5 o'clock 2.2    Undermining (cm) throughout  Drainage Amount Copious  increased drainage with pressure on lateral LE.   Drainage Description Serosanguineous;No odor   Treatment Cleansed;Debridement (Selective);Hydrotherapy (Pulse lavage);Other (Comment)  edema massage to both Rt and Lt LE   Pulsed lavage therapy - wound location wound bed Lt shin   Pulsed Lavage with Suction (psi) 4 psi   Pulsed Lavage with Suction - Normal Saline Used 1000 mL   Pulsed Lavage Tip Tip with splash shield   Selective Debridement - Location wound bed of Lt LE   Selective Debridement - Tools Used Forceps;Scalpel   Selective Debridement - Tissue Removed slough    Wound Therapy - Clinical Statement Pt only has one small non draining wound on his  Rt LE at this time therefore this wound will be discharged to self care.  Lt Le wound continues to granulate but as slough has been removed wound size is now measuring larger.  The wound is not getting larger there was so much slough and necrotic tissue initially it was difficulty to assess.  Pt will continue to benefit from skilled physical therapy to improve granulation by debridement and decrease edema with multillayer bandaging.       Wound Therapy - Functional Problem List difficulty walking, pan    Factors Delaying/Impairing Wound Healing Infection - systemic/local;Immobility;Multiple medical problems;Polypharmacy;Vascular compromise   Hydrotherapy Plan Debridement;Dressing change;Patient/family education;Pulsatile lavage with suction   Wound Therapy - Frequency 3X / week   Wound Therapy - Current Recommendations PT   Wound Plan continue treatment for Lt LE; Rt LE may be discharged to self care.    Dressing  silverhydrofiber followed by profore using extra cotton to make cone shape of LE                    PT Short Term Goals - 08/12/15 1444    PT SHORT TERM GOAL #1   Title Pt to be able to verbalize signs and symptoms of cellulitis as well as the importance to call MD to prevent a systemic infection   Time 1   Period Weeks   Status Achieved   PT SHORT TERM GOAL #2   Title Pt pain to be decreased to no more than a 4/10 to allow pt to be able to walk for five minutes without increased pain    Time 4   Period Weeks   Status Achieved   PT SHORT TERM GOAL #3   Title Wound bed to be 100% granulated to decrease risk of infection and allow closure of wound    Time 4   Period Weeks   Status On-going   PT SHORT TERM GOAL #4   Title drainage to be scant to show decreased local infection and prevent soiling of clothes    Time 4   Period Weeks   Status On-going   PT SHORT TERM GOAL #5   Title Pt to have scant drainage from Rt LE to prevent soiling of clothes and decrease risk of  infection    Time 1   Period Weeks   Status Achieved           PT Long Term Goals - 08/12/15 1445    PT LONG TERM GOAL #1   Title Pt pain to be no greater than a 1/10 to allow pt to walk for up to 10 minutes without having to take rest breaks and with no increased Lt leg pain    Time 8   Period Weeks   Status On-going  PT LONG TERM GOAL #2   Title wound size to have decreased by 3x1 cm with no depth to allow pt and wife to be able to care for wound at home.    Time 8   Period Weeks   Status On-going   PT LONG TERM GOAL #3   Title Pt wound to have no drainage for reduced risk of infection    Time 8   Period Weeks   Status On-going   PT LONG TERM GOAL #4   Title Pt and wife to verbalize confidence in maintaning wound care at home until wound is completely healed.    Time 8   Period Weeks   Status On-going   PT LONG TERM GOAL #5   Title Pt Rt LE to have no drainage to allow pt to begin wearing his juxtafit again    Time 3   Period Weeks   Status Achieved               Plan - 08/12/15 1444    Rehab Potential Good   PT Frequency 3x / week   PT Duration 8 weeks   PT Treatment/Interventions Manual lymph drainage;Compression bandaging;Manual techniques;Other (comment)  pulse lavage and debridement    PT Next Visit Plan begin pulse lavage, continue manual to decrease edema and debridement, dressing change    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 Chronic respiratory failure with hypoxia and hypercapnia ([place]) 12/11/2014  . Obesity hypoventilation syndrome ([place]) 12/11/2014  . Addisons disease ([place]) 12/11/2014  . Noncompliance with CPAP treatment 12/11/2014  . 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 Breath shortness 10/06/2014  . Snoring 07/01/2013  . Diaphoresis 07/01/2013  . Adrenal insufficiency (Addison's disease) ([place]) 07/01/2013  . Hypogonadism male 07/01/2013  . Hypoxemia 07/01/2013  . Lung nodules   . Aortic aneurysm without rupture ([place])   . AAA (abdominal aortic aneurysm) ([place])   . Hyperlipidemia   . Clotting disorder ([place])   . Arthritis   . Osteoarthritis   . Dyspnea on exertion 05/31/2010  Rayetta Humphrey, PT CLT [phone] 08/12/2015, 2:47 PM  [facility] [place] [address] [place], [place], [address] Phone: [phone]   Fax:  [phone]  Name: MAXMILLIAN HOCHSTEIN MRN: [id number] Date of Birth: [birth year]

## 2015-08-14 ENCOUNTER — Ambulatory Visit (HOSPITAL_COMMUNITY): Payer: Medicare Other

## 2015-08-14 ENCOUNTER — Encounter: Payer: Self-pay | Admitting: Vascular Surgery

## 2015-08-14 DIAGNOSIS — R262 Difficulty in walking, not elsewhere classified: Secondary | ICD-10-CM

## 2015-08-14 DIAGNOSIS — I83028 Varicose veins of left lower extremity with ulcer other part of lower leg: Secondary | ICD-10-CM | POA: Diagnosis not present

## 2015-08-14 DIAGNOSIS — M25562 Pain in left knee: Secondary | ICD-10-CM

## 2015-08-14 DIAGNOSIS — L97219 Non-pressure chronic ulcer of right calf with unspecified severity: Secondary | ICD-10-CM

## 2015-08-14 DIAGNOSIS — I83012 Varicose veins of right lower extremity with ulcer of calf: Secondary | ICD-10-CM | POA: Diagnosis not present

## 2015-08-14 DIAGNOSIS — L97829 Non-pressure chronic ulcer of other part of left lower leg with unspecified severity: Secondary | ICD-10-CM

## 2015-08-14 DIAGNOSIS — S81022A Laceration with foreign body, left knee, initial encounter: Secondary | ICD-10-CM

## 2015-08-14 NOTE — Therapy (Signed)
[place], [place], [address] Phone: [phone]   Fax:  [phone]  Wound Care Therapy  Patient Details  Name: [name] MRN: [id number] Date of Birth: 1940-07-24 No Data Recorded  Encounter Date: 08/14/2015      PT End of Session - 08/14/15 1847    Visit Number 8   Number of Visits 24   Date for PT Re-Evaluation 08/28/15   Authorization Type [employer] medicare   Authorization - Visit Number 8   Authorization - Number of Visits 10   PT Start Time [id number]   PT Stop Time 1510   PT Time Calculation (min) 81 min   Activity Tolerance Patient tolerated treatment well   Behavior During Therapy [facility] for tasks assessed/performed      Past Medical History  Diagnosis Date  . Diabetes mellitus     type II  . Obesity   . Osteoarthritis   . Venous insufficiency     lower extremities  . Cholecystitis   . AAA (abdominal aortic aneurysm) ([place])     followed by Dr. Sherren Mocha Early  . Hyperlipidemia   . Clotting disorder ([place])     history of DVT  . Arthritis   . Lung nodules   . Aortic aneurysm without rupture ([place])   . Hypertension     Past Surgical History  Procedure Laterality Date  . Cholecystectomy    . Inguinal hernia repair    . Testicle surgery  2004    Dr. [name]  . Cataract extraction w/phaco  10/21/2010    Procedure: CATARACT EXTRACTION PHACO AND INTRAOCULAR LENS PLACEMENT (IOC);  Surgeon: Tonny Branch;  Location: [facility];  Service: Ophthalmology;  Laterality: Left;  CDE:16.80  . Yag laser application Right [id number]    Procedure: YAG LASER APPLICATION;  Surgeon: [name], MD;  Location: [facility];  Service: Ophthalmology;  Laterality: Right;    There were no vitals filed for this visit.       Subjective Assessment - 08/14/15 1830    Subjective New referral for Rt LE wound care.  Pt entered with compression hose Rt LE and noted weaking.  Dressings intact for Lt LE, no reports of pain   Currently in  Pain? No/denies                   Wound Therapy - 08/14/15 1833    Subjective New referral for Rt LE wound care.  Pt entered with compression hose Rt LE and noted weaking.  Dressings intact for Lt LE, no reports of pain   Patient and Family Stated Goals wound to heal    Date of Onset 06/30/15   Prior Treatments antibiotics, HH    Pain Assessment No/denies pain   Evaluation and Treatment Procedures Explained to Patient/Family Yes   Evaluation and Treatment Procedures agreed to   Wound Properties Date First Assessed: 08/05/15 Time First Assessed: 1039 Wound Type: Other (Comment) Location: Leg Location Orientation: Right;Medial Wound Description (Comments): multiple small openings that are weeping  Present on Admission: Yes Final Assessment Date: 08/12/15 Final Assessment Time: 1500   Dressing Type Alginate;Compression wrap   Dressing Changed Changed   Dressing Status Old drainage   Dressing Change Frequency Monday, Wednesday, Friday   Site / Wound Assessment Dry;Granulation tissue;Red   % Wound base Red or Granulating 100%   % Wound base Yellow 0%   % Wound base Other (Comment) --  noted weaking   Peri-wound Assessment  Hemosiderin;Induration   Wound Length (cm) --  2 small opening remain with weaping noted   Drainage Amount Moderate   Drainage Description Serous;No odor   Treatment Cleansed  cleansed, application of alginate, Profore   Wound Properties Date First Assessed: 07/29/15 Time First Assessed: 0916 Wound Type: Puncture Location: Leg Location Orientation: Left;Lower Present on Admission: Yes   Dressing Type Silver hydrofiber;Alginate;Abdominal pads;Compression wrap  Profore   Dressing Changed Changed   Dressing Status Old drainage   Dressing Change Frequency Monday, Wednesday, Friday   Site / Wound Assessment Bleeding;Friable;Yellow   % Wound base Red or Granulating 50%   % Wound base Yellow 50%   Peri-wound Assessment Hemosiderin;Edema   Drainage Amount  Copious   Drainage Description Serosanguineous;No odor   Treatment Cleansed;Debridement (Selective);Hydrotherapy (Pulse lavage)   Pulsed lavage therapy - wound location wound bed Lt shin   Pulsed Lavage with Suction (psi) 4 psi   Pulsed Lavage with Suction - Normal Saline Used 1000 mL   Pulsed Lavage Tip Tip with splash shield   Selective Debridement - Location wound bed of Lt LE   Selective Debridement - Tools Used Forceps;Scalpel   Selective Debridement - Tissue Removed slough    Wound Therapy - Clinical Statement Received referral to resume wound care to Rt LE.  Wife entered with pt. for session and was emotional through session.  Pt entered dept with compression hose on Rt LE and Profore dressings on Lt LE, moderate drainage through compression hose on Rt LE.  PT aware of new referral and instructed care for wound.  PLS complete to Lt on anterior shin following selective debribement of slough for cleaning and to increase healing.  Continued with appropriate dressings for drainage and to assist with removal of slough with profore for edema control.  No reports of pain through session.     Wound Therapy - Functional Problem List difficulty walking, pan    Factors Delaying/Impairing Wound Healing Infection - systemic/local;Immobility;Multiple medical problems;Polypharmacy;Vascular compromise   Hydrotherapy Plan Debridement;Dressing change;Patient/family education;Pulsatile lavage with suction   Wound Therapy - Frequency 3X / week   Wound Therapy - Current Recommendations PT   Wound Plan Continue treatment for Bil LE   Dressing  silverhydrofiber, alginate, ABD pad followed by profore using extra cotton to make cone shape of LE                    PT Short Term Goals - 08/12/15 1444    PT SHORT TERM GOAL #1   Title Pt to be able to verbalize signs and symptoms of cellulitis as well as the importance to call MD to prevent a systemic infection   Time 1   Period Weeks   Status  Achieved   PT SHORT TERM GOAL #2   Title Pt pain to be decreased to no more than a 4/10 to allow pt to be able to walk for five minutes without increased pain    Time 4   Period Weeks   Status Achieved   PT SHORT TERM GOAL #3   Title Wound bed to be 100% granulated to decrease risk of infection and allow closure of wound    Time 4   Period Weeks   Status On-going   PT SHORT TERM GOAL #4   Title drainage to be scant to show decreased local infection and prevent soiling of clothes    Time 4   Period Weeks   Status On-going   PT SHORT TERM GOAL #  5   Title Pt to have scant drainage from Rt LE to prevent soiling of clothes and decrease risk of infection    Time 1   Period Weeks   Status Achieved           PT Long Term Goals - 08/12/15 1445    PT LONG TERM GOAL #1   Title Pt pain to be no greater than a 1/10 to allow pt to walk for up to 10 minutes without having to take rest breaks and with no increased Lt leg pain    Time 8   Period Weeks   Status On-going   PT LONG TERM GOAL #2   Title wound size to have decreased by 3x1 cm with no depth to allow pt and wife to be able to care for wound at home.    Time 8   Period Weeks   Status On-going   PT LONG TERM GOAL #3   Title Pt wound to have no drainage for reduced risk of infection    Time 8   Period Weeks   Status On-going   PT LONG TERM GOAL #4   Title Pt and wife to verbalize confidence in maintaning wound care at home until wound is completely healed.    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 Chronic respiratory failure with hypoxia and hypercapnia ([place]) 12/11/2014  . Obesity hypoventilation syndrome ([place]) 12/11/2014  . Addisons disease ([place]) 12/11/2014  . Noncompliance with CPAP treatment 12/11/2014  . 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 Breath shortness 10/06/2014  . Snoring 07/01/2013  . Diaphoresis 07/01/2013  . Adrenal insufficiency (Addison's disease) ([place]) 07/01/2013  . Hypogonadism male 07/01/2013  . Hypoxemia 07/01/2013  . Lung nodules   . Aortic aneurysm without rupture ([place])   . AAA (abdominal aortic aneurysm) ([place])   . Hyperlipidemia   . Clotting disorder ([place])   . Arthritis   . Osteoarthritis   . Dyspnea on exertion 05/31/2010    Aldona Lento PTA 08/14/2015, 6:50 PM Rayetta Humphrey, PT CLT [facility] [address] [place], [place], [address] Phone: [phone]   Fax:  [phone]  Name: [name] MRN: [id number] Date of Birth: 08-11-1940

## 2015-08-17 ENCOUNTER — Ambulatory Visit (HOSPITAL_COMMUNITY): Payer: Medicare Other | Attending: Internal Medicine | Admitting: Physical Therapy

## 2015-08-17 DIAGNOSIS — M25562 Pain in left knee: Secondary | ICD-10-CM | POA: Insufficient documentation

## 2015-08-17 DIAGNOSIS — I83012 Varicose veins of right lower extremity with ulcer of calf: Secondary | ICD-10-CM | POA: Diagnosis not present

## 2015-08-17 DIAGNOSIS — X58XXXA Exposure to other specified factors, initial encounter: Secondary | ICD-10-CM | POA: Diagnosis not present

## 2015-08-17 DIAGNOSIS — R262 Difficulty in walking, not elsewhere classified: Secondary | ICD-10-CM | POA: Insufficient documentation

## 2015-08-17 DIAGNOSIS — I890 Lymphedema, not elsewhere classified: Secondary | ICD-10-CM | POA: Insufficient documentation

## 2015-08-17 DIAGNOSIS — L97219 Non-pressure chronic ulcer of right calf with unspecified severity: Secondary | ICD-10-CM

## 2015-08-17 DIAGNOSIS — S81022A Laceration with foreign body, left knee, initial encounter: Secondary | ICD-10-CM

## 2015-08-17 DIAGNOSIS — L97829 Non-pressure chronic ulcer of other part of left lower leg with unspecified severity: Secondary | ICD-10-CM

## 2015-08-17 DIAGNOSIS — I83028 Varicose veins of left lower extremity with ulcer other part of lower leg: Secondary | ICD-10-CM | POA: Diagnosis not present

## 2015-08-18 NOTE — Therapy (Addendum)
[place], [place], [address] Phone: [phone]   Fax:  [phone]  Wound Care Therapy  Patient Details  Name: [name] MRN: [id number] Date of Birth: [birth year] No Data Recorded  Encounter Date: 08/17/2015      PT End of Session - 08/17/15 0942    Visit Number 9   Number of Visits 24   Date for PT Re-Evaluation 08/28/15   Authorization Type UHC medicare   Authorization - Visit Number 9  G code done at visit 9    Authorization - Number of Visits 19   Activity Tolerance Patient tolerated treatment well   Behavior During Therapy [facility] for tasks assessed/performed      Past Medical History  Diagnosis Date  . Diabetes mellitus     type II  . Obesity   . Osteoarthritis   . Venous insufficiency     lower extremities  . Cholecystitis   . AAA (abdominal aortic aneurysm) ([place])     followed by Dr. Sherren Mocha Early  . Hyperlipidemia   . Clotting disorder ([place])     history of DVT  . Arthritis   . Lung nodules   . Aortic aneurysm without rupture ([place])   . Hypertension     Past Surgical History  Procedure Laterality Date  . Cholecystectomy    . Inguinal hernia repair    . Testicle surgery  2004    Dr. [name]  . Cataract extraction w/phaco  10/21/2010    Procedure: CATARACT EXTRACTION PHACO AND INTRAOCULAR LENS PLACEMENT (IOC);  Surgeon: Tonny Branch;  Location: [facility];  Service: Ophthalmology;  Laterality: Left;  CDE:16.80  . Yag laser application Right [id number]    Procedure: YAG LASER APPLICATION;  Surgeon: [name], MD;  Location: [facility];  Service: Ophthalmology;  Laterality: Right;    There were no vitals filed for this visit.                  Wound Therapy - 08/17/15 0929    Subjective Pt states that he is having increased pain with his legs especally his Rt foot.  He is not sure if it was wrapped to tightly or if he is getting gout   Patient and Family Stated Goals wound to heal     Date of Onset 06/30/15   Prior Treatments antibiotics, HH    Pain Assessment 0-10   Pain Score 6    Pain Type Acute pain   Pain Location Foot   Pain Orientation Left   Pain Descriptors / Indicators Aching   Pain Onset On-going   Patients Stated Pain Goal 0   Pain Intervention(s) Emotional support   Evaluation and Treatment Procedures Explained to Patient/Family Yes   Evaluation and Treatment Procedures agreed to   Wound Properties Date First Assessed: 08/05/15 Time First Assessed: 1039 Wound Type: Other (Comment) Location: Leg Location Orientation: Right;Medial Wound Description (Comments): multiple small openings that are weeping  Present on Admission: Yes Final Assessment Date: 08/12/15 Final Assessment Time: 1500   Dressing Type Compression wrap   Dressing Changed Changed   Dressing Status Old drainage   Dressing Change Frequency PRN   Site / Wound Assessment Dry;Granulation tissue;Red   % Wound base Red or Granulating 0% was 0%:     % Wound base Yellow 100% was 100%    % Wound base Other (Comment) --  noted weeping from medial thigh and ankle    Peri-wound Assessment Hemosiderin;Induration  Drainage Amount Moderate to copious    Drainage Description Serous;Green   Treatment Cleansed   Wound Properties Date First Assessed: 07/29/15 Time First Assessed: 0916 Wound Type: Puncture Location: Leg Location Orientation: Left;Lower Present on Admission: Yes   Dressing Type Silver hydrofiber;Alginate;Abdominal pads;Compression wrap  Profore   Dressing Changed Changed   Dressing Status Old drainage   Dressing Change Frequency Monday, Wednesday, Friday   Site / Wound Assessment Bleeding;Friable;Yellow   % Wound base Red or Granulating 50% was 0%   % Wound base Yellow 50% was 100%; undermining superiorly .6cm ; laterally 1.5 cm; tunnel at 4o'clock that goes at least 4.0cm in depth; if pressure is placed on lateral leg purulent drainage comes out copious in amount.    Peri-wound  Assessment Hemosiderin;Edema   Drainage Amount Copious   Drainage Description Purulent;Odor   Treatment Cleansed;Debridement (Selective);Other (Comment)  manual decongestive techniques    Pulsed lavage therapy - wound location wound bed Lt shin   Pulsed Lavage with Suction (psi) 4 psi   Pulsed Lavage with Suction - Normal Saline Used 1000 mL   Pulsed Lavage Tip Tip with splash shield   Selective Debridement - Location wound bed of Lt LE   Selective Debridement - Tools Used Forceps;Scalpel   Selective Debridement - Tissue Removed slough    Wound Therapy - Clinical Statement Pt has significant drainage of Lt LE wound.  When manual techniques for decongestion was performed in the lateral aspect of the wound significant purulent drainage would come up from the tunneling of the wound.  This continues for approximately five minutes.  Therapist advised pt to obtain an antiobiotic.  Pt drainage from Rt LE is decreased from compression but old drainage does have a green tint to it.  Pt will continue to benefit from skilled therapy to heal wounds.    Wound Therapy - Functional Problem List difficulty walking, pan    Factors Delaying/Impairing Wound Healing Infection - systemic/local;Immobility;Multiple medical problems;Polypharmacy;Vascular compromise   Hydrotherapy Plan Debridement;Dressing change;Patient/family education;Pulsatile lavage with suction   Wound Therapy - Frequency 3X / week   Wound Therapy - Current Recommendations PT   Wound Plan Continue treatment for Bil LE   Dressing  silverhydrofiber, alginate, ABD pad followed by profore using extra cotton to make cone shape of LE                    PT Short Term Goals - 08/19/15 1553    PT SHORT TERM GOAL #1   Title Pt to be able to verbalize signs and symptoms of cellulitis as well as the importance to call MD to prevent a systemic infection   Time 1   Period Weeks   Status Achieved   PT SHORT TERM GOAL #2   Title Pt pain to be  decreased to no more than a 4/10 to allow pt to be able to walk for five minutes without increased pain    Time 4   Period Weeks   Status On-going   PT SHORT TERM GOAL #3   Title Wound bed to be 100% granulated to decrease risk of infection and allow closure of wound    Time 4   Period Weeks   Status On-going   PT SHORT TERM GOAL #4   Title drainage to be scant to show decreased local infection and prevent soiling of clothes    Time 4   Period Weeks   Status On-going   PT SHORT TERM GOAL #5  Title Pt to have scant drainage from Rt LE to prevent soiling of clothes and decrease risk of infection    Time 1   Period Weeks   Status On-going           PT Long Term Goals - 08/19/15 1553    PT LONG TERM GOAL #1   Title Pt pain to be no greater than a 1/10 to allow pt to walk for up to 10 minutes without having to take rest breaks and with no increased Lt leg pain    Time 8   Period Weeks   Status On-going   PT LONG TERM GOAL #2   Title wound size to have decreased by 3x1 cm with no depth to allow pt and wife to be able to care for wound at home.    Time 8   Period Weeks   Status On-going   PT LONG TERM GOAL #3   Title Pt wound to have no drainage for reduced risk of infection    Time 8   Period Weeks   Status On-going   PT LONG TERM GOAL #4   Title Pt and wife to verbalize confidence in maintaning wound care at home until wound is completely healed.    Time 8   Period Weeks   Status On-going   PT LONG TERM GOAL #5   Title Pt Rt LE to have no drainage to allow pt to begin wearing his juxtafit again    Time 3   Period Weeks   Status Achieved               Plan - 08/17/15 0942    Rehab Potential Good   PT Frequency 3x / week   PT Duration 8 weeks   PT Treatment/Interventions Manual lymph drainage;Compression bandaging;Manual techniques;Other (comment)  pulse lavage and debridement    PT Next Visit Plan begin pulse lavage, continue manual to decrease edema and  debridement, dressing change    Consulted and Agree with Plan of Care Patient    [phone] CL [phone] CJ  Patient will benefit from skilled therapeutic intervention in order to improve the following deficits and impairments:  Decreased activity tolerance, Pain, Other (comment), Increased edema  Visit Diagnosis: Laceration of left knee with foreign body, initial encounter  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 Chronic respiratory failure with hypoxia and hypercapnia ([place]) 12/11/2014  . Obesity hypoventilation syndrome ([place]) 12/11/2014  . Addisons disease ([place]) 12/11/2014  . Noncompliance with CPAP treatment 12/11/2014  . 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 Breath shortness 10/06/2014  . Snoring 07/01/2013  . Diaphoresis 07/01/2013  . Adrenal insufficiency (Addison's disease) ([place]) 07/01/2013  . Hypogonadism male 07/01/2013  . Hypoxemia 07/01/2013  . Lung nodules   . Aortic aneurysm without rupture ([place])   . AAA (abdominal aortic aneurysm) ([place])   . Hyperlipidemia   . Clotting disorder ([place])   . Arthritis   . Osteoarthritis   . Dyspnea on exertion 05/31/2010   Rayetta Humphrey, PT CLT [phone] 08/21/2015, 1:19 PM  [facility] [address] [place], [place], [address] Phone: [phone]   Fax:  [phone]  Name: Scott  AVA Bentley MRN: [id number] Date of Birth: 1940/12/02

## 2015-08-19 ENCOUNTER — Telehealth (HOSPITAL_COMMUNITY): Payer: Self-pay | Admitting: Physical Therapy

## 2015-08-19 ENCOUNTER — Ambulatory Visit (HOSPITAL_COMMUNITY): Payer: Medicare Other | Admitting: Physical Therapy

## 2015-08-19 DIAGNOSIS — R262 Difficulty in walking, not elsewhere classified: Secondary | ICD-10-CM

## 2015-08-19 DIAGNOSIS — S81022A Laceration with foreign body, left knee, initial encounter: Secondary | ICD-10-CM | POA: Diagnosis not present

## 2015-08-19 DIAGNOSIS — I83028 Varicose veins of left lower extremity with ulcer other part of lower leg: Secondary | ICD-10-CM | POA: Diagnosis not present

## 2015-08-19 DIAGNOSIS — M25562 Pain in left knee: Secondary | ICD-10-CM

## 2015-08-19 DIAGNOSIS — L97219 Non-pressure chronic ulcer of right calf with unspecified severity: Secondary | ICD-10-CM

## 2015-08-19 DIAGNOSIS — I890 Lymphedema, not elsewhere classified: Secondary | ICD-10-CM | POA: Diagnosis not present

## 2015-08-19 DIAGNOSIS — I83012 Varicose veins of right lower extremity with ulcer of calf: Secondary | ICD-10-CM

## 2015-08-19 DIAGNOSIS — L97829 Non-pressure chronic ulcer of other part of left lower leg with unspecified severity: Secondary | ICD-10-CM

## 2015-08-19 NOTE — Telephone Encounter (Signed)
Called patient.  Patient schedule has 1:00 pm.  Pt will be here at 1:00.  Rayetta Humphrey, [place] CLT [phone]

## 2015-08-19 NOTE — Therapy (Addendum)
[place], [place], [address] Phone: [phone]   Fax:  [phone]  Wound Care Therapy  Patient Details  Name: [name] MRN: [id number] Date of Birth: August 02, 1940 No Data Recorded  Encounter Date: 08/19/2015      PT End of Session - 08/19/15 1551    Visit Number 10   Number of Visits 24   Date for PT Re-Evaluation 08/28/15   Authorization Type [employer] medicare   Authorization - Visit Number 10  G code done at visit 9    Authorization - Number of Visits 19   PT Start Time 1300   PT Stop Time 1419   PT Time Calculation (min) 79 min   Activity Tolerance Patient tolerated treatment well   Behavior During Therapy [facility] for tasks assessed/performed      Past Medical History  Diagnosis Date  . Diabetes mellitus     type II  . Obesity   . Osteoarthritis   . Venous insufficiency     lower extremities  . Cholecystitis   . AAA (abdominal aortic aneurysm) ([place])     followed by Dr. Sherren Mocha Early  . Hyperlipidemia   . Clotting disorder ([place])     history of DVT  . Arthritis   . Lung nodules   . Aortic aneurysm without rupture ([place])   . Hypertension     Past Surgical History  Procedure Laterality Date  . Cholecystectomy    . Inguinal hernia repair    . Testicle surgery  2004    Dr. [name]  . Cataract extraction w/phaco  10/21/2010    Procedure: CATARACT EXTRACTION PHACO AND INTRAOCULAR LENS PLACEMENT (IOC);  Surgeon: Tonny Branch;  Location: [facility];  Service: Ophthalmology;  Laterality: Left;  CDE:16.80  . Yag laser application Right [id number]    Procedure: YAG LASER APPLICATION;  Surgeon: [name], MD;  Location: [facility];  Service: Ophthalmology;  Laterality: Right;    There were no vitals filed for this visit.                  Wound Therapy - 08/19/15 1537    Subjective Pt states that he is now on an antibiotic.  Pt continues to have increase pain in his left foot as well as weeping  from the inner aspect of his left thigh    Patient and Family Stated Goals wound to heal    Date of Onset 06/30/15   Prior Treatments antibiotics, HH    Pain Assessment 0-10   Pain Score 4    Pain Type Acute pain   Pain Location Foot   Pain Orientation Left   Pain Descriptors / Indicators Aching   Pain Onset On-going   Pain Intervention(s) Emotional support   Evaluation and Treatment Procedures Explained to Patient/Family Yes   Evaluation and Treatment Procedures agreed to   Wound Properties Date First Assessed: 08/05/15 Time First Assessed: 1039 Wound Type: Other (Comment) Location: Leg Location Orientation: Right;Medial Wound Description (Comments): multiple small openings that are weeping  Present on Admission: Yes Final Assessment Date: 08/12/15 Final Assessment Time: 1500   Dressing Type Compression wrap   Dressing Changed Changed   Dressing Status Old drainage   Dressing Change Frequency PRN   Site / Wound Assessment Dry;Granulation tissue;Red   % Wound base Red or Granulating 100%   % Wound base Yellow 0%   % Wound base Other (Comment) --  noted weaking   Peri-wound Assessment  Edema;Hemosiderin;Induration   Drainage Amount Moderate   Drainage Description Serous;Green   Treatment Cleansed;Other (Comment)  decongestive manual techniques, compression dressing,   Wound Properties Date First Assessed: 07/29/15 Time First Assessed: 0916 Wound Type: Puncture Location: Leg Location Orientation: Left;Lower Present on Admission: Yes   Dressing Type Silver hydrofiber;Alginate;Abdominal pads;Compression wrap  Profore   Dressing Changed Changed   Dressing Status Old drainage   Dressing Change Frequency Monday, Wednesday, Friday   Site / Wound Assessment Bleeding;Friable;Yellow   % Wound base Red or Granulating 60%   % Wound base Yellow 40%   Peri-wound Assessment Hemosiderin;Edema   Drainage Amount Copious   Drainage Description Purulent;Odor   Treatment Cleansed;Debridement  (Selective);Hydrotherapy (Pulse lavage);Other (Comment)  manual decongestive techniques and profore dressing    Pulsed lavage therapy - wound location wound bed Lt shin   Pulsed Lavage with Suction (psi) 4 psi   Pulsed Lavage with Suction - Normal Saline Used 1000 mL   Pulsed Lavage Tip Tip with splash shield   Selective Debridement - Location wound bed of Lt LE   Selective Debridement - Tools Used Forceps;Scalpel   Selective Debridement - Tissue Removed slough    Wound Therapy - Clinical Statement Decreased purulent drainage erupting from tunnel of Lt LE wound compared to 08/14/2015 but drainage continues to erupt as manual is completed.  Pt Lt LE edema is increasing therefore decongestive techniques were started.  Therapist suggested thigh high hose rather than knee high but pt states that he is unable to keep thigh high hose up; he has tried them.     Wound Therapy - Functional Problem List difficulty walking, pan    Factors Delaying/Impairing Wound Healing Infection - systemic/local;Immobility;Multiple medical problems;Polypharmacy;Vascular compromise   Hydrotherapy Plan Debridement;Dressing change;Patient/family education;Pulsatile lavage with suction   Wound Therapy - Frequency 3X / week   Wound Therapy - Current Recommendations PT   Wound Plan Continue treatment for Bil LE   Dressing  silverhydrofiber, ABD pad followed by profore for LT; Rt alginate and profore using extra cotton to make cone shape of LE    Manual Therapy decongestive techniques including supraclavicular, deep and superfical abdominal, route fluid using inguinal/axillary anastomsis B and LE.                    PT Short Term Goals - 08/19/15 1553    PT SHORT TERM GOAL #1   Title Pt to be able to verbalize signs and symptoms of cellulitis as well as the importance to call MD to prevent a systemic infection   Time 1   Period Weeks   Status Achieved   PT SHORT TERM GOAL #2   Title Pt pain to be decreased to  no more than a 4/10 to allow pt to be able to walk for five minutes without increased pain    Time 4   Period Weeks   Status On-going   PT SHORT TERM GOAL #3   Title Wound bed to be 100% granulated to decrease risk of infection and allow closure of wound    Time 4   Period Weeks   Status On-going   PT SHORT TERM GOAL #4   Title drainage to be scant to show decreased local infection and prevent soiling of clothes    Time 4   Period Weeks   Status On-going   PT SHORT TERM GOAL #5   Title Pt to have scant drainage from Rt LE to prevent soiling of clothes and decrease risk  of infection    Time 1   Period Weeks   Status On-going           PT Long Term Goals - 08/19/15 1553    PT LONG TERM GOAL #1   Title Pt pain to be no greater than a 1/10 to allow pt to walk for up to 10 minutes without having to take rest breaks and with no increased Lt leg pain    Time 8   Period Weeks   Status On-going   PT LONG TERM GOAL #2   Title wound size to have decreased by 3x1 cm with no depth to allow pt and wife to be able to care for wound at home.    Time 8   Period Weeks   Status On-going   PT LONG TERM GOAL #3   Title Pt wound to have no drainage for reduced risk of infection    Time 8   Period Weeks   Status On-going   PT LONG TERM GOAL #4   Title Pt and wife to verbalize confidence in maintaning wound care at home until wound is completely healed.    Time 8   Period Weeks   Status On-going   PT LONG TERM GOAL #5   Title Pt Rt LE to have no drainage to allow pt to begin wearing his juxtafit again    Time 3   Period Weeks   Status Achieved               Plan - 08/19/15 1552    Clinical Impression Statement see above    Rehab Potential Good   PT Frequency 3x / week   PT Duration 8 weeks   PT Treatment/Interventions Manual lymph drainage;Compression bandaging;Manual techniques;Other (comment)  pulse lavage and debridement    PT Next Visit Plan continue with manual, pulse  lavage and dressing change.  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 Chronic respiratory failure with hypoxia and hypercapnia ([place]) 12/11/2014  . Obesity hypoventilation syndrome ([place]) 12/11/2014  . Addisons disease ([place]) 12/11/2014  . Noncompliance with CPAP treatment 12/11/2014  . 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 Breath shortness 10/06/2014  . Snoring 07/01/2013  . Diaphoresis 07/01/2013  . Adrenal insufficiency (Addison's disease) ([place]) 07/01/2013  . Hypogonadism male 07/01/2013  . Hypoxemia 07/01/2013  . Lung nodules   . Aortic aneurysm without rupture ([place])   . AAA (abdominal aortic aneurysm) ([place])   . Hyperlipidemia   . Clotting disorder ([place])   . Arthritis   . Osteoarthritis   . Dyspnea on exertion 05/31/2010    Rayetta Humphrey, PT CLT [phone] 08/19/2015, 3:54 PM  [facility] [address] [place], [place], [address] Phone: [phone]   Fax:  [phone]  Name: SHOTA DUREL MRN: [id number] Date of Birth: 01-21-1941

## 2015-08-20 DIAGNOSIS — R600 Localized edema: Secondary | ICD-10-CM | POA: Diagnosis not present

## 2015-08-20 DIAGNOSIS — R5383 Other fatigue: Secondary | ICD-10-CM | POA: Diagnosis not present

## 2015-08-20 DIAGNOSIS — L03119 Cellulitis of unspecified part of limb: Secondary | ICD-10-CM | POA: Diagnosis not present

## 2015-08-20 DIAGNOSIS — I872 Venous insufficiency (chronic) (peripheral): Secondary | ICD-10-CM | POA: Diagnosis not present

## 2015-08-20 DIAGNOSIS — M1000 Idiopathic gout, unspecified site: Secondary | ICD-10-CM | POA: Diagnosis not present

## 2015-08-21 ENCOUNTER — Ambulatory Visit (HOSPITAL_COMMUNITY): Payer: Medicare Other | Admitting: Physical Therapy

## 2015-08-21 DIAGNOSIS — R262 Difficulty in walking, not elsewhere classified: Secondary | ICD-10-CM

## 2015-08-21 DIAGNOSIS — M25562 Pain in left knee: Secondary | ICD-10-CM | POA: Diagnosis not present

## 2015-08-21 DIAGNOSIS — I83012 Varicose veins of right lower extremity with ulcer of calf: Secondary | ICD-10-CM | POA: Diagnosis not present

## 2015-08-21 DIAGNOSIS — I83028 Varicose veins of left lower extremity with ulcer other part of lower leg: Secondary | ICD-10-CM

## 2015-08-21 DIAGNOSIS — L97829 Non-pressure chronic ulcer of other part of left lower leg with unspecified severity: Secondary | ICD-10-CM

## 2015-08-21 DIAGNOSIS — S81022A Laceration with foreign body, left knee, initial encounter: Secondary | ICD-10-CM

## 2015-08-21 DIAGNOSIS — I890 Lymphedema, not elsewhere classified: Secondary | ICD-10-CM | POA: Diagnosis not present

## 2015-08-21 DIAGNOSIS — L97219 Non-pressure chronic ulcer of right calf with unspecified severity: Secondary | ICD-10-CM

## 2015-08-21 NOTE — Therapy (Signed)
[place], [place], [address] Phone: [phone]   Fax:  [phone]  Wound Care Therapy  Patient Details  Name: [name] MRN: [id number] Date of Birth: 07/05/1940 No Data Recorded  Encounter Date: 08/21/2015      PT End of Session - 08/21/15 1334    Visit Number 11   Number of Visits 24   Date for PT Re-Evaluation 08/28/15   Authorization Type [employer] medicare   Authorization - Visit Number 11  G code done at visit 9    Authorization - Number of Visits 19   PT Start Time 1115   PT Stop Time 1250   PT Time Calculation (min) 95 min   Activity Tolerance Patient tolerated treatment well   Behavior During Therapy [facility] for tasks assessed/performed      Past Medical History  Diagnosis Date  . Diabetes mellitus     type II  . Obesity   . Osteoarthritis   . Venous insufficiency     lower extremities  . Cholecystitis   . AAA (abdominal aortic aneurysm) ([place])     followed by Dr. Sherren Mocha Early  . Hyperlipidemia   . Clotting disorder ([place])     history of DVT  . Arthritis   . Lung nodules   . Aortic aneurysm without rupture ([place])   . Hypertension     Past Surgical History  Procedure Laterality Date  . Cholecystectomy    . Inguinal hernia repair    . Testicle surgery  2004    Dr. [name]  . Cataract extraction w/phaco  10/21/2010    Procedure: CATARACT EXTRACTION PHACO AND INTRAOCULAR LENS PLACEMENT (IOC);  Surgeon: Tonny Branch;  Location: [facility];  Service: Ophthalmology;  Laterality: Left;  CDE:16.80  . Yag laser application Right [id number]    Procedure: YAG LASER APPLICATION;  Surgeon: [name], MD;  Location: [facility];  Service: Ophthalmology;  Laterality: Right;    There were no vitals filed for this visit.                  Wound Therapy - 08/21/15 1325    Subjective Pt states that the MD told him to double up on his fluid pills.  Pt noted increased urine output and decreased  induration after last session where therapist completed decongestive techniques.    Patient and Family Stated Goals wound to heal    Date of Onset 06/30/15   Prior Treatments antibiotics, HH    Pain Assessment No/denies pain   Evaluation and Treatment Procedures Explained to Patient/Family Yes   Evaluation and Treatment Procedures agreed to   Wound Properties Date First Assessed: 08/05/15 Time First Assessed: 1039 Wound Type: Other (Comment) Location: Leg Location Orientation: Right;Medial Wound Description (Comments): multiple small openings that are weeping  Present on Admission: Yes Final Assessment Date: 08/12/15 Final Assessment Time: 1500   Dressing Type Compression wrap   Dressing Changed Changed   Dressing Status Old drainage   Dressing Change Frequency PRN   Site / Wound Assessment Dry;Red   % Wound base Red or Granulating 100%   % Wound base Yellow 0%   % Wound base Other (Comment) --  noted weeping from medial thigh only now.   Peri-wound Assessment Hemosiderin;Induration   Drainage Amount Moderate  to copious drainage from medial ankle and thigh    Drainage Description Purulent;Green   Treatment Cleansed;Other (Comment)  manual technique followed by compression dressing using foam  Wound Properties Date First Assessed: 07/29/15 Time First Assessed: 0916 Wound Type: Puncture Location: Leg Location Orientation: Left;Lower Present on Admission: Yes   Dressing Type Silver hydrofiber;Alginate;Abdominal pads;Compression wrap  Profore   Dressing Changed Changed   Dressing Status Old drainage   Dressing Change Frequency Monday, Wednesday, Friday   Site / Wound Assessment Bleeding;Friable;Yellow   % Wound base Red or Granulating 60%  was 0%   % Wound base Yellow 40%  was 100%   Peri-wound Assessment Hemosiderin;Edema   Drainage Amount Copious   Drainage Description Purulent;Odor   Treatment Cleansed;Debridement (Selective);Hydrotherapy (Pulse lavage);Other (Comment)  manual  techniques for decongesting LE    Pulsed lavage therapy - wound location wound bed Lt shin   Pulsed Lavage with Suction (psi) 4 psi   Pulsed Lavage with Suction - Normal Saline Used 1000 mL   Pulsed Lavage Tip Tip with splash shield   Selective Debridement - Location wound bed of Lt LE   Selective Debridement - Tools Used Forceps;Scalpel   Selective Debridement - Tissue Removed slough    Wound Therapy - Clinical Statement Therapist sent an order to MD for lymphedema treatment.  Pt advised pt that if he obtained bikers shorts the compression may help the induration in his thighs.     Wound Therapy - Functional Problem List difficulty walking, pain    Factors Delaying/Impairing Wound Healing Infection - systemic/local;Immobility;Multiple medical problems;Polypharmacy;Vascular compromise   Hydrotherapy Plan Debridement;Dressing change;Patient/family education;Pulsatile lavage with suction   Wound Therapy - Frequency 3X / week   Wound Therapy - Current Recommendations PT   Wound Plan Continue treatment for Bil LE   Dressing  silverhydrofiber, alginate, ABD pad followed by profore using extra cotton to make cone shape of LE    Manual Therapy decongestive techniques including supraclavicular, deep and superfical abdominal, route fluid using inguinal/axillary anastomsis B and LE.                  PT Education - 08/21/15 1333    Education provided Yes   Education Details obtain biker shorts;  pump lateral aspect of thigh to help to decreased induration on a frequent basis   Person(s) Educated Patient   Methods Explanation   Comprehension Returned demonstration;Verbalized understanding          PT Short Term Goals - 08/21/15 1336    PT SHORT TERM GOAL #1   Title Pt to be able to verbalize signs and symptoms of cellulitis as well as the importance to call MD to prevent a systemic infection   Time 1   Period Weeks   Status Achieved   PT SHORT TERM GOAL #2   Title Pt pain to be  decreased to no more than a 4/10 to allow pt to be able to walk for five minutes without increased pain    Time 4   Period Weeks   Status On-going   PT SHORT TERM GOAL #3   Title Wound bed to be 100% granulated to decrease risk of infection and allow closure of wound    Time 4   Period Weeks   Status On-going   PT SHORT TERM GOAL #4   Title drainage to be scant to show decreased local infection and prevent soiling of clothes    Time 4   Period Weeks   Status On-going   PT SHORT TERM GOAL #5   Title Pt to have scant drainage from Rt LE to prevent soiling of clothes and decrease risk of infection  Time 1   Period Weeks   Status On-going           PT Long Term Goals - 08/21/15 1336    PT LONG TERM GOAL #1   Title Pt pain to be no greater than a 1/10 to allow pt to walk for up to 10 minutes without having to take rest breaks and with no increased Lt leg pain    Time 8   Period Weeks   Status On-going   PT LONG TERM GOAL #2   Title wound size to have decreased by 3x1 cm with no depth to allow pt and wife to be able to care for wound at home.    Time 8   Period Weeks   Status On-going   PT LONG TERM GOAL #3   Title Pt wound to have no drainage for reduced risk of infection    Time 8   Period Weeks   Status On-going   PT LONG TERM GOAL #4   Title Pt and wife to verbalize confidence in maintaning wound care at home until wound is completely healed.    Time 8   Period Weeks   Status On-going   PT LONG TERM GOAL #5   Title Pt Rt LE to have no drainage to allow pt to begin wearing his juxtafit again    Time 3   Period Weeks   Status Achieved               Plan - 08/21/15 1334    Clinical Impression Statement see above    Rehab Potential Good   PT Frequency 3x / week   PT Duration 8 weeks   PT Treatment/Interventions Manual lymph drainage;Compression bandaging;Manual techniques;Other (comment)  pulse lavage and debridement    PT Next Visit Plan continue with  manual, pulse lavage and dressing change.  If lymphedema order recieved measure legs for volume give pt order form to obtain short stretch bandages. 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 Chronic respiratory failure with hypoxia and hypercapnia ([place]) 12/11/2014  . Obesity hypoventilation syndrome ([place]) 12/11/2014  . Addisons disease ([place]) 12/11/2014  . Noncompliance with CPAP treatment 12/11/2014  . 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 Breath shortness 10/06/2014  . Snoring 07/01/2013  . Diaphoresis 07/01/2013  . Adrenal insufficiency (Addison's disease) ([place]) 07/01/2013  . Hypogonadism male 07/01/2013  . Hypoxemia 07/01/2013  . Lung nodules   . Aortic aneurysm without rupture ([place])   . AAA (abdominal aortic aneurysm) ([place])   . Hyperlipidemia   . Clotting disorder ([place])   . Arthritis   . Osteoarthritis   . Dyspnea on exertion 05/31/2010    Rayetta Humphrey, PT CLT [phone] 08/21/2015, 1:37 PM  [facility] [address] [place], [place], [address] Phone: [phone]   Fax:  [phone]  Name: [name] MRN: [id number] Date of Birth: 1941-01-05

## 2015-08-24 ENCOUNTER — Ambulatory Visit (HOSPITAL_COMMUNITY): Payer: Medicare Other | Admitting: Physical Therapy

## 2015-08-24 ENCOUNTER — Encounter: Payer: Self-pay | Admitting: Vascular Surgery

## 2015-08-24 DIAGNOSIS — I83012 Varicose veins of right lower extremity with ulcer of calf: Secondary | ICD-10-CM

## 2015-08-24 DIAGNOSIS — M25562 Pain in left knee: Secondary | ICD-10-CM | POA: Diagnosis not present

## 2015-08-24 DIAGNOSIS — E119 Type 2 diabetes mellitus without complications: Secondary | ICD-10-CM | POA: Diagnosis not present

## 2015-08-24 DIAGNOSIS — S81022A Laceration with foreign body, left knee, initial encounter: Secondary | ICD-10-CM | POA: Diagnosis not present

## 2015-08-24 DIAGNOSIS — H538 Other visual disturbances: Secondary | ICD-10-CM | POA: Diagnosis not present

## 2015-08-24 DIAGNOSIS — I83028 Varicose veins of left lower extremity with ulcer other part of lower leg: Secondary | ICD-10-CM

## 2015-08-24 DIAGNOSIS — I890 Lymphedema, not elsewhere classified: Secondary | ICD-10-CM | POA: Diagnosis not present

## 2015-08-24 DIAGNOSIS — L97219 Non-pressure chronic ulcer of right calf with unspecified severity: Secondary | ICD-10-CM

## 2015-08-24 DIAGNOSIS — R262 Difficulty in walking, not elsewhere classified: Secondary | ICD-10-CM

## 2015-08-24 DIAGNOSIS — Z961 Presence of intraocular lens: Secondary | ICD-10-CM | POA: Diagnosis not present

## 2015-08-24 DIAGNOSIS — L97829 Non-pressure chronic ulcer of other part of left lower leg with unspecified severity: Secondary | ICD-10-CM

## 2015-08-24 NOTE — Therapy (Signed)
[place], [place], [address] Phone: [phone]   Fax:  [phone]  Wound Care Therapy  Patient Details  Name: [name] MRN: [id number] Date of Birth: 1940/04/01 No Data Recorded  Encounter Date: 08/24/2015      PT End of Session - 08/24/15 1653    Visit Number 12   Number of Visits 24   Date for PT Re-Evaluation 09/04/15   Authorization Type [employer] medicare   Authorization Time Period cert period [id number]   Authorization - Visit Number 12  G code done at visit 9    Authorization - Number of Visits 19   PT Start Time [id number]   PT Stop Time 1515   PT Time Calculation (min) 80 min   Activity Tolerance Patient tolerated treatment well   Behavior During Therapy [facility] for tasks assessed/performed      Past Medical History  Diagnosis Date  . Diabetes mellitus     type II  . Obesity   . Osteoarthritis   . Venous insufficiency     lower extremities  . Cholecystitis   . AAA (abdominal aortic aneurysm) ([place])     followed by Dr. Sherren Mocha Early  . Hyperlipidemia   . Clotting disorder ([place])     history of DVT  . Arthritis   . Lung nodules   . Aortic aneurysm without rupture ([place])   . Hypertension     Past Surgical History  Procedure Laterality Date  . Cholecystectomy    . Inguinal hernia repair    . Testicle surgery  2004    Dr. [name]  . Cataract extraction w/phaco  10/21/2010    Procedure: CATARACT EXTRACTION PHACO AND INTRAOCULAR LENS PLACEMENT (IOC);  Surgeon: Tonny Branch;  Location: [facility];  Service: Ophthalmology;  Laterality: Left;  CDE:16.80  . Yag laser application Right [id number]    Procedure: YAG LASER APPLICATION;  Surgeon: [name], MD;  Location: [facility];  Service: Ophthalmology;  Laterality: Right;    There were no vitals filed for this visit.                  Wound Therapy - 08/24/15 1658    Subjective Pt states he is still taking his fluid pills.  Reports his upper  medial thigh is still draining and his bandages are soaked.    Patient and Family Stated Goals wound to heal    Date of Onset 06/30/15   Prior Treatments antibiotics, HH    Pain Assessment No/denies pain   Evaluation and Treatment Procedures Explained to Patient/Family Yes   Evaluation and Treatment Procedures agreed to   Wound Properties Date First Assessed: 08/05/15 Time First Assessed: 1039 Wound Type: Other (Comment) Location: Leg Location Orientation: Right;Medial Wound Description (Comments): multiple small openings that are weeping  Present on Admission: Yes Final Assessment Date: 08/12/15 Final Assessment Time: 1500   Dressing Type Compression wrap   Dressing Changed Changed   Dressing Status Old drainage   Dressing Change Frequency PRN   Site / Wound Assessment Dry;Red   % Wound base Red or Granulating 100%   % Wound base Yellow 0%   % Wound base Other (Comment) --  noted weeping from medial thigh only now.   Peri-wound Assessment Hemosiderin;Induration   Drainage Amount Moderate  to copious drainage from medial ankle and thigh    Drainage Description Purulent;Green   Treatment Cleansed;Debridement (Selective)   Wound Properties Date First Assessed: 07/29/15 Time First  Assessed: 0916 Wound Type: Puncture Location: Leg Location Orientation: Left;Lower Present on Admission: Yes   Dressing Type Silver hydrofiber;Alginate;Abdominal pads;Compression wrap  Profore   Dressing Changed Changed   Dressing Status Old drainage   Dressing Change Frequency Monday, Wednesday, Friday   Site / Wound Assessment Bleeding;Friable;Yellow   % Wound base Red or Granulating 60%  was 0%   % Wound base Yellow 40%  was 100%   Peri-wound Assessment Hemosiderin;Edema   Drainage Amount Copious   Drainage Description Purulent;Odor   Treatment Hydrotherapy (Pulse lavage);Debridement (Selective)   Pulsed lavage therapy - wound location wound bed Lt shin   Pulsed Lavage with Suction (psi) 4 psi    Pulsed Lavage with Suction - Normal Saline Used 1000 mL   Pulsed Lavage Tip Tip with splash shield   Selective Debridement - Location wound bed of Lt LE   Selective Debridement - Tools Used Forceps;Scalpel   Selective Debridement - Tissue Removed slough    Wound Therapy - Clinical Statement Lymphedema order not received yet.  Pt with noted pseudomonas drainage saturating the top of his dressing on Rt LE.  Drainage from Lt anterior shin also soaked through with odor present. Noted gushing of drainage with manual to Lt LE near wound site.  Unsure if there is a deeper pocket at this point.   Recommended patient contact MD regarding type of drainage to see if needs a wound culture to change antibiotics.  Continued with pulse lavage to thoroughly irrigate wound f/b manual and moistuzing.  silver hydro used with ABD pads   Wound Therapy - Functional Problem List difficulty walking, pain    Factors Delaying/Impairing Wound Healing Infection - systemic/local;Immobility;Multiple medical problems;Polypharmacy;Vascular compromise   Hydrotherapy Plan Debridement;Dressing change;Patient/family education;Pulsatile lavage with suction   Wound Therapy - Frequency 3X / week   Wound Therapy - Current Recommendations PT   Wound Plan Continue treatment for Bil LE   Dressing  silverhydrofiber, alginate, ABD pad followed by profore using extra cotton to make cone shape of LE    Manual Therapy decongestive techniques including supraclavicular, deep and superfical abdominal, route fluid using inguinal/axillary anastomsis B and LE.                    PT Short Term Goals - 08/21/15 1336    PT SHORT TERM GOAL #1   Title Pt to be able to verbalize signs and symptoms of cellulitis as well as the importance to call MD to prevent a systemic infection   Time 1   Period Weeks   Status Achieved   PT SHORT TERM GOAL #2   Title Pt pain to be decreased to no more than a 4/10 to allow pt to be able to walk for five  minutes without increased pain    Time 4   Period Weeks   Status On-going   PT SHORT TERM GOAL #3   Title Wound bed to be 100% granulated to decrease risk of infection and allow closure of wound    Time 4   Period Weeks   Status On-going   PT SHORT TERM GOAL #4   Title drainage to be scant to show decreased local infection and prevent soiling of clothes    Time 4   Period Weeks   Status On-going   PT SHORT TERM GOAL #5   Title Pt to have scant drainage from Rt LE to prevent soiling of clothes and decrease risk of infection    Time 1  Period Weeks   Status On-going           PT Long Term Goals - 08/21/15 1336    PT LONG TERM GOAL #1   Title Pt pain to be no greater than a 1/10 to allow pt to walk for up to 10 minutes without having to take rest breaks and with no increased Lt leg pain    Time 8   Period Weeks   Status On-going   PT LONG TERM GOAL #2   Title wound size to have decreased by 3x1 cm with no depth to allow pt and wife to be able to care for wound at home.    Time 8   Period Weeks   Status On-going   PT LONG TERM GOAL #3   Title Pt wound to have no drainage for reduced risk of infection    Time 8   Period Weeks   Status On-going   PT LONG TERM GOAL #4   Title Pt and wife to verbalize confidence in maintaning wound care at home until wound is completely healed.    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 Chronic respiratory failure with hypoxia and hypercapnia ([place]) 12/11/2014  . Obesity hypoventilation syndrome ([place]) 12/11/2014  . Addisons disease ([place]) 12/11/2014  . Noncompliance with CPAP treatment 12/11/2014  . 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 Breath shortness 10/06/2014  . Snoring 07/01/2013  . Diaphoresis 07/01/2013  . Adrenal insufficiency (Addison's disease) ([place]) 07/01/2013  . Hypogonadism male 07/01/2013  . Hypoxemia 07/01/2013  . Lung nodules   . Aortic aneurysm without rupture ([place])   . AAA (abdominal aortic aneurysm) ([place])   . Hyperlipidemia   . Clotting disorder ([place])   . Arthritis   . Osteoarthritis   . Dyspnea on exertion 05/31/2010    Teena Irani, PTA/CLT [phone]  08/24/2015, 5:06 PM  [place] [address] [place], [place], [address] Phone: [phone]   Fax:  [phone]  Name: [name] MRN: [id number] Date of Birth: [birth year]

## 2015-08-25 ENCOUNTER — Ambulatory Visit: Payer: Medicare Other | Admitting: Vascular Surgery

## 2015-08-25 ENCOUNTER — Other Ambulatory Visit: Payer: Medicare Other

## 2015-08-26 ENCOUNTER — Ambulatory Visit (HOSPITAL_COMMUNITY): Payer: Medicare Other | Admitting: Physical Therapy

## 2015-08-26 DIAGNOSIS — R262 Difficulty in walking, not elsewhere classified: Secondary | ICD-10-CM

## 2015-08-26 DIAGNOSIS — L97219 Non-pressure chronic ulcer of right calf with unspecified severity: Secondary | ICD-10-CM

## 2015-08-26 DIAGNOSIS — S81022A Laceration with foreign body, left knee, initial encounter: Secondary | ICD-10-CM | POA: Diagnosis not present

## 2015-08-26 DIAGNOSIS — M25562 Pain in left knee: Secondary | ICD-10-CM | POA: Diagnosis not present

## 2015-08-26 DIAGNOSIS — I83028 Varicose veins of left lower extremity with ulcer other part of lower leg: Secondary | ICD-10-CM

## 2015-08-26 DIAGNOSIS — L97829 Non-pressure chronic ulcer of other part of left lower leg with unspecified severity: Secondary | ICD-10-CM

## 2015-08-26 DIAGNOSIS — I83012 Varicose veins of right lower extremity with ulcer of calf: Secondary | ICD-10-CM | POA: Diagnosis not present

## 2015-08-26 DIAGNOSIS — I890 Lymphedema, not elsewhere classified: Secondary | ICD-10-CM | POA: Diagnosis not present

## 2015-08-26 NOTE — Therapy (Signed)
[place], [place], [address] Phone: [phone]   Fax:  [phone]  Wound Care Therapy  Patient Details  Name: [name] MRN: [id number] Date of Birth: 05-04-1940 No Data Recorded  Encounter Date: 08/26/2015      PT End of Session - 08/26/15 1815    Visit Number 13   Number of Visits 24   Date for PT Re-Evaluation 09/04/15   Authorization Type [employer] medicare   Authorization Time Period cert period [id number]   Authorization - Visit Number 13  G code done at visit 9    Authorization - Number of Visits 19   PT Start Time [id number]   PT Stop Time 1420   PT Time Calculation (min) 75 min   Activity Tolerance Patient tolerated treatment well   Behavior During Therapy [facility] for tasks assessed/performed      Past Medical History  Diagnosis Date  . Diabetes mellitus     type II  . Obesity   . Osteoarthritis   . Venous insufficiency     lower extremities  . Cholecystitis   . AAA (abdominal aortic aneurysm) ([place])     followed by Dr. Sherren Mocha Early  . Hyperlipidemia   . Clotting disorder ([place])     history of DVT  . Arthritis   . Lung nodules   . Aortic aneurysm without rupture ([place])   . Hypertension     Past Surgical History  Procedure Laterality Date  . Cholecystectomy    . Inguinal hernia repair    . Testicle surgery  2004    Dr. [name]  . Cataract extraction w/phaco  10/21/2010    Procedure: CATARACT EXTRACTION PHACO AND INTRAOCULAR LENS PLACEMENT (IOC);  Surgeon: Tonny Branch;  Location: [facility];  Service: Ophthalmology;  Laterality: Left;  CDE:16.80  . Yag laser application Right [id number]    Procedure: YAG LASER APPLICATION;  Surgeon: [name], MD;  Location: [facility];  Service: Ophthalmology;  Laterality: Right;    There were no vitals filed for this visit.                  Wound Therapy - 08/26/15 1805    Subjective Pt states he can tell an improvment in his swelling in his LE's  and reduction in drainage as well.  Reports he called his MD but they did not do anything about issuing another antibiotic or culture.    Patient and Family Stated Goals wound to heal    Date of Onset 06/30/15   Prior Treatments antibiotics, HH    Pain Assessment No/denies pain   Evaluation and Treatment Procedures Explained to Patient/Family Yes   Evaluation and Treatment Procedures agreed to   Wound Properties Date First Assessed: 08/05/15 Time First Assessed: 1039 Wound Type: Other (Comment) Location: Leg Location Orientation: Right;Medial Wound Description (Comments): multiple small openings that are weeping  Present on Admission: Yes Final Assessment Date: 08/12/15 Final Assessment Time: 1500   Dressing Type Silver hydrofiber;Abdominal pads   Dressing Changed Changed   Dressing Status Old drainage   Dressing Change Frequency PRN   Site / Wound Assessment Dry;Red   % Wound base Red or Granulating 100%   % Wound base Yellow 0%   % Wound base Other (Comment) --  noted weeping from medial thigh only now.   Peri-wound Assessment Hemosiderin;Induration   Drainage Amount Moderate  to copious drainage from medial ankle and thigh    Drainage Description Purulent;Green  Treatment Cleansed;Debridement (Selective)   Wound Properties Date First Assessed: 07/29/15 Time First Assessed: 0916 Wound Type: Puncture Location: Leg Location Orientation: Left;Lower Present on Admission: Yes   Dressing Type Silver hydrofiber;Alginate;Abdominal pads;Compression wrap  Profore   Dressing Changed Changed   Dressing Status Old drainage   Dressing Change Frequency Monday, Wednesday, Friday   Site / Wound Assessment Yellow;Pink;Red   % Wound base Red or Granulating 35%  was 0%   % Wound base Yellow 65%  was 100%   Peri-wound Assessment Hemosiderin;Edema   Drainage Amount Copious   Drainage Description Purulent;Odor   Treatment Hydrotherapy (Pulse lavage);Debridement (Selective)   Pulsed lavage therapy -  wound location wound bed Lt shin   Pulsed Lavage with Suction (psi) 4 psi   Pulsed Lavage with Suction - Normal Saline Used 1000 mL   Pulsed Lavage Tip Tip with splash shield   Selective Debridement - Location wound bed of Lt LE   Selective Debridement - Tools Used Forceps;Scalpel   Selective Debridement - Tissue Removed slough    Wound Therapy - Clinical Statement Lymphedema order not received yet.  Overall reduction but still with seudomonas drainage saturating the top of his dressing on Rt LE.  Drainage from Lt anterior shin also soaked through hower noted reducted induration of LE's today.  Less exudate produced from Lt shin wound with manual today.  Continued with pulse lavage to thoroughly irrigate wound f/b manual and moistuzing.  silver hydro used with ABD pads to wound and medial Rt LE as fluid continues to weep from this area.    Wound Therapy - Functional Problem List difficulty walking, pain    Factors Delaying/Impairing Wound Healing Infection - systemic/local;Immobility;Multiple medical problems;Polypharmacy;Vascular compromise   Hydrotherapy Plan Debridement;Dressing change;Patient/family education;Pulsatile lavage with suction   Wound Therapy - Frequency 3X / week   Wound Therapy - Current Recommendations PT   Wound Plan Continue treatment for Bil LE.    Dressing  Lt LE: packing of silver hydrofiber, ABD, #9 lymph netting, profore, #9 netting over to thigh region   Dressing Rt LE: silver hydo and ABD and kerlix to medial thigh.  Lower with #9 lymph netting, profore and #9 over netting.    Manual Therapy Retro massage to bilateral LE's and moistuizing                   PT Short Term Goals - 08/21/15 1336    PT SHORT TERM GOAL #1   Title Pt to be able to verbalize signs and symptoms of cellulitis as well as the importance to call MD to prevent a systemic infection   Time 1   Period Weeks   Status Achieved   PT SHORT TERM GOAL #2   Title Pt pain to be decreased to  no more than a 4/10 to allow pt to be able to walk for five minutes without increased pain    Time 4   Period Weeks   Status On-going   PT SHORT TERM GOAL #3   Title Wound bed to be 100% granulated to decrease risk of infection and allow closure of wound    Time 4   Period Weeks   Status On-going   PT SHORT TERM GOAL #4   Title drainage to be scant to show decreased local infection and prevent soiling of clothes    Time 4   Period Weeks   Status On-going   PT SHORT TERM GOAL #5   Title Pt to have scant drainage  from Rt LE to prevent soiling of clothes and decrease risk of infection    Time 1   Period Weeks   Status On-going           PT Long Term Goals - 08/21/15 1336    PT LONG TERM GOAL #1   Title Pt pain to be no greater than a 1/10 to allow pt to walk for up to 10 minutes without having to take rest breaks and with no increased Lt leg pain    Time 8   Period Weeks   Status On-going   PT LONG TERM GOAL #2   Title wound size to have decreased by 3x1 cm with no depth to allow pt and wife to be able to care for wound at home.    Time 8   Period Weeks   Status On-going   PT LONG TERM GOAL #3   Title Pt wound to have no drainage for reduced risk of infection    Time 8   Period Weeks   Status On-going   PT LONG TERM GOAL #4   Title Pt and wife to verbalize confidence in maintaning wound care at home until wound is completely healed.    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 Chronic respiratory failure with hypoxia and hypercapnia ([place]) 12/11/2014  . Obesity hypoventilation syndrome ([place]) 12/11/2014  . Addisons disease ([place]) 12/11/2014  . Noncompliance with CPAP treatment 12/11/2014  . 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 Breath shortness 10/06/2014  . Snoring 07/01/2013  . Diaphoresis 07/01/2013  . Adrenal insufficiency (Addison's disease) ([place]) 07/01/2013  . Hypogonadism male 07/01/2013  . Hypoxemia 07/01/2013  . Lung nodules   . Aortic aneurysm without rupture ([place])   . AAA (abdominal aortic aneurysm) ([place])   . Hyperlipidemia   . Clotting disorder ([place])   . Arthritis   . Osteoarthritis   . Dyspnea on exertion 05/31/2010    Teena Irani, PTA/CLT [phone]  08/26/2015, 6:17 PM  [place] [address] [place], [place], [address] Phone: [phone]   Fax:  [phone]  Name: [name] MRN: [id number] Date of Birth: 11/16/1940

## 2015-08-27 DIAGNOSIS — J449 Chronic obstructive pulmonary disease, unspecified: Secondary | ICD-10-CM | POA: Diagnosis not present

## 2015-08-28 ENCOUNTER — Ambulatory Visit (HOSPITAL_COMMUNITY): Payer: Medicare Other

## 2015-08-28 ENCOUNTER — Encounter (HOSPITAL_COMMUNITY): Payer: Self-pay

## 2015-08-28 DIAGNOSIS — R262 Difficulty in walking, not elsewhere classified: Secondary | ICD-10-CM | POA: Diagnosis not present

## 2015-08-28 DIAGNOSIS — I83028 Varicose veins of left lower extremity with ulcer other part of lower leg: Secondary | ICD-10-CM

## 2015-08-28 DIAGNOSIS — I890 Lymphedema, not elsewhere classified: Secondary | ICD-10-CM | POA: Diagnosis not present

## 2015-08-28 DIAGNOSIS — L97219 Non-pressure chronic ulcer of right calf with unspecified severity: Secondary | ICD-10-CM

## 2015-08-28 DIAGNOSIS — S81022A Laceration with foreign body, left knee, initial encounter: Secondary | ICD-10-CM | POA: Diagnosis not present

## 2015-08-28 DIAGNOSIS — I83012 Varicose veins of right lower extremity with ulcer of calf: Secondary | ICD-10-CM | POA: Diagnosis not present

## 2015-08-28 DIAGNOSIS — L97829 Non-pressure chronic ulcer of other part of left lower leg with unspecified severity: Secondary | ICD-10-CM

## 2015-08-28 DIAGNOSIS — M25562 Pain in left knee: Secondary | ICD-10-CM

## 2015-08-28 NOTE — Therapy (Signed)
[place], [place], [address] Phone: [phone]   Fax:  [phone]  Wound Care Therapy  Patient Details  Name: [name] MRN: [id number] Date of Birth: 03/21/1940 No Data Recorded  Encounter Date: 08/28/2015      PT End of Session - 08/28/15 1432    Visit Number 14   Number of Visits 24   Date for PT Re-Evaluation 09/04/15   Authorization Type U[employer] medicare   Authorization Time Period cert period [id number]; Gcode complete visit 9   Authorization - Visit Number 14   Authorization - Number of Visits 19   PT Start Time [id number]   PT Stop Time 1428   PT Time Calculation (min) 83 min   Activity Tolerance Patient tolerated treatment well   Behavior During Therapy [facility] for tasks assessed/performed      Past Medical History  Diagnosis Date  . Diabetes mellitus     type II  . Obesity   . Osteoarthritis   . Venous insufficiency     lower extremities  . Cholecystitis   . AAA (abdominal aortic aneurysm) ([place])     followed by Dr. Sherren Mocha Early  . Hyperlipidemia   . Clotting disorder ([place])     history of DVT  . Arthritis   . Lung nodules   . Aortic aneurysm without rupture ([place])   . Hypertension     Past Surgical History  Procedure Laterality Date  . Cholecystectomy    . Inguinal hernia repair    . Testicle surgery  2004    Dr. [name]  . Cataract extraction w/phaco  10/21/2010    Procedure: CATARACT EXTRACTION PHACO AND INTRAOCULAR LENS PLACEMENT (IOC);  Surgeon: Tonny Branch;  Location: [facility];  Service: Ophthalmology;  Laterality: Left;  CDE:16.80  . Yag laser application Right [id number]    Procedure: YAG LASER APPLICATION;  Surgeon: [name], MD;  Location: [facility];  Service: Ophthalmology;  Laterality: Right;    There were no vitals filed for this visit.       Subjective Assessment - 08/28/15 1450    Subjective Pt entered with dressings intact though were sliding down, no pain with wound or  Bil LE.  Pt does c/o pain Rt ankle with gout, pain scale 5/10   Currently in Pain? Yes   Pain Score 5    Pain Location Foot   Pain Orientation Right   Pain Descriptors / Indicators Aching  Gout pain   Pain Type Acute pain                   Wound Therapy - 08/28/15 1450    Subjective Pt entered with dressings intact though were sliding down, no pain with wound or Bil LE.  Pt does c/o pain Rt ankle with gout, pain scale 5/10   Patient and Family Stated Goals wound to heal    Date of Onset 06/30/15   Prior Treatments antibiotics, HH    Pain Assessment 0-10   Evaluation and Treatment Procedures Explained to Patient/Family Yes   Evaluation and Treatment Procedures agreed to   Wound Properties Date First Assessed: 08/05/15 Time First Assessed: 1039 Wound Type: Other (Comment) Location: Leg Location Orientation: Right;Medial Wound Description (Comments): multiple small openings that are weeping  Present on Admission: Yes Final Assessment Date: 08/12/15 Final Assessment Time: 1500   Dressing Type Abdominal pads;Compression wrap  Profore   Dressing Changed Changed   Dressing Status Old drainage  Dressing Change Frequency PRN   Site / Wound Assessment Dry;Red   % Wound base Red or Granulating 100%   % Wound base Yellow 0%   % Wound base Other (Comment) --  weeping from Rt medial thigh   Peri-wound Assessment Hemosiderin;Induration   Drainage Amount Moderate   Drainage Description Purulent;Green   Treatment Cleansed;Debridement (Selective)   Wound Properties Date First Assessed: 07/29/15 Time First Assessed: 0916 Wound Type: Puncture Location: Leg Location Orientation: Left;Lower Present on Admission: Yes   Dressing Type Silver hydrofiber;Abdominal pads;Gauze (Comment);Compression wrap  Profore   Dressing Changed Changed   Dressing Status Old drainage   Dressing Change Frequency Monday, Wednesday, Friday   Site / Wound Assessment Yellow;Pink;Red   % Wound base Red or  Granulating 40%   % Wound base Yellow 60%   Peri-wound Assessment Hemosiderin;Edema   Drainage Amount Copious   Drainage Description Purulent;Odor   Treatment Cleansed;Debridement (Selective);Hydrotherapy (Pulse lavage)   Pulsed lavage therapy - wound location wound bed Lt shin   Pulsed Lavage with Suction (psi) 4 psi   Pulsed Lavage with Suction - Normal Saline Used 1000 mL   Pulsed Lavage Tip Tip with splash shield   Selective Debridement - Location wound bed of Lt LE   Selective Debridement - Tools Used Forceps;Scalpel   Selective Debridement - Tissue Removed slough    Wound Therapy - Clinical Statement Have not received order for lymphedema yet.  Noted pseumomonas drainage from Rt medial aspect of thigh.  Session focus on wound care with PLS to wound on anterior shin for cleansing and selective debridement for removal of slough.  Retro massage complete for edema control with noted yellow puss out of inferior tunnel.  Continues with silver hydrofiber dressings, ABD pad for drainage and profore compression hose for edema control.  Enforced dressings with medipore tape proximal dressings to assist with sliding down LE.     Wound Therapy - Functional Problem List difficulty walking, pain    Factors Delaying/Impairing Wound Healing Infection - systemic/local;Immobility;Multiple medical problems;Polypharmacy;Vascular compromise   Hydrotherapy Plan Debridement;Dressing change;Patient/family education;Pulsatile lavage with suction   Wound Therapy - Frequency 3X / week   Wound Therapy - Current Recommendations PT   Wound Plan Continue treatment for Bil LE   Dressing  Lt LE: packing of silver hydrofiber, ABD, #9 lymph netting, profore, #9 netting over to thigh region   Dressing Rt LE: silver hydo and ABD and kerlix to medial thigh.  Lower with #9 lymph netting, profore and #9 over netting.    Manual Therapy Retro massage to bilateral LE's and moistuizing              PT Short Term Goals -  08/21/15 1336    PT SHORT TERM GOAL #1   Title Pt to be able to verbalize signs and symptoms of cellulitis as well as the importance to call MD to prevent a systemic infection   Time 1   Period Weeks   Status Achieved   PT SHORT TERM GOAL #2   Title Pt pain to be decreased to no more than a 4/10 to allow pt to be able to walk for five minutes without increased pain    Time 4   Period Weeks   Status On-going   PT SHORT TERM GOAL #3   Title Wound bed to be 100% granulated to decrease risk of infection and allow closure of wound    Time 4   Period Weeks   Status On-going  PT SHORT TERM GOAL #4   Title drainage to be scant to show decreased local infection and prevent soiling of clothes    Time 4   Period Weeks   Status On-going   PT SHORT TERM GOAL #5   Title Pt to have scant drainage from Rt LE to prevent soiling of clothes and decrease risk of infection    Time 1   Period Weeks   Status On-going           PT Long Term Goals - 08/21/15 1336    PT LONG TERM GOAL #1   Title Pt pain to be no greater than a 1/10 to allow pt to walk for up to 10 minutes without having to take rest breaks and with no increased Lt leg pain    Time 8   Period Weeks   Status On-going   PT LONG TERM GOAL #2   Title wound size to have decreased by 3x1 cm with no depth to allow pt and wife to be able to care for wound at home.    Time 8   Period Weeks   Status On-going   PT LONG TERM GOAL #3   Title Pt wound to have no drainage for reduced risk of infection    Time 8   Period Weeks   Status On-going   PT LONG TERM GOAL #4   Title Pt and wife to verbalize confidence in maintaning wound care at home until wound is completely healed.    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 Chronic respiratory failure with hypoxia and hypercapnia ([place]) 12/11/2014  . Obesity hypoventilation syndrome ([place]) 12/11/2014  . Addisons disease ([place]) 12/11/2014  . Noncompliance with CPAP treatment 12/11/2014  . 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 Breath shortness 10/06/2014  . Snoring 07/01/2013  . Diaphoresis 07/01/2013  . Adrenal insufficiency (Addison's disease) ([place]) 07/01/2013  . Hypogonadism male 07/01/2013  . Hypoxemia 07/01/2013  . Lung nodules   . Aortic aneurysm without rupture ([place])   . AAA (abdominal aortic aneurysm) ([place])   . Hyperlipidemia   . Clotting disorder ([place])   . Arthritis   . Osteoarthritis   . Dyspnea on exertion 05/31/2010  Ihor Austin, LPTA; CBIS [phone]   Aldona Lento 08/28/2015, 4:30 PM  [place] [address] [place], [place], [address] Phone: [phone]   Fax:  [phone]  Name: [name] MRN: [id number] Date of Birth: 1940-04-12

## 2015-08-31 ENCOUNTER — Ambulatory Visit (HOSPITAL_COMMUNITY): Payer: Medicare Other | Admitting: Physical Therapy

## 2015-08-31 DIAGNOSIS — R262 Difficulty in walking, not elsewhere classified: Secondary | ICD-10-CM | POA: Diagnosis not present

## 2015-08-31 DIAGNOSIS — M25562 Pain in left knee: Secondary | ICD-10-CM

## 2015-08-31 DIAGNOSIS — L97829 Non-pressure chronic ulcer of other part of left lower leg with unspecified severity: Secondary | ICD-10-CM

## 2015-08-31 DIAGNOSIS — S81022A Laceration with foreign body, left knee, initial encounter: Secondary | ICD-10-CM

## 2015-08-31 DIAGNOSIS — I83012 Varicose veins of right lower extremity with ulcer of calf: Secondary | ICD-10-CM | POA: Diagnosis not present

## 2015-08-31 DIAGNOSIS — I10 Essential (primary) hypertension: Secondary | ICD-10-CM | POA: Diagnosis not present

## 2015-08-31 DIAGNOSIS — I83028 Varicose veins of left lower extremity with ulcer other part of lower leg: Secondary | ICD-10-CM

## 2015-08-31 DIAGNOSIS — L97219 Non-pressure chronic ulcer of right calf with unspecified severity: Secondary | ICD-10-CM

## 2015-08-31 DIAGNOSIS — J449 Chronic obstructive pulmonary disease, unspecified: Secondary | ICD-10-CM | POA: Diagnosis not present

## 2015-08-31 DIAGNOSIS — J9611 Chronic respiratory failure with hypoxia: Secondary | ICD-10-CM | POA: Diagnosis not present

## 2015-08-31 DIAGNOSIS — I890 Lymphedema, not elsewhere classified: Secondary | ICD-10-CM | POA: Diagnosis not present

## 2015-08-31 NOTE — Therapy (Signed)
[place], [place], [address] Phone: [phone]   Fax:  [phone]  Wound Care Therapy  Patient Details  Name: [name] MRN: [id number] Date of Birth: 12/13/1940 No Data Recorded  Encounter Date: 08/31/2015      PT End of Session - 08/31/15 1613    Visit Number 15   Number of Visits 24   Date for PT Re-Evaluation 09/04/15   Authorization Type UHC medicare   Authorization Time Period cert period [id number]; Gcode complete visit 9   Authorization - Visit Number 15   Authorization - Number of Visits 19   PT Start Time 1300   PT Stop Time 1430   PT Time Calculation (min) 90 min   Activity Tolerance Patient tolerated treatment well   Behavior During Therapy [facility] for tasks assessed/performed      Past Medical History  Diagnosis Date  . Diabetes mellitus     type II  . Obesity   . Osteoarthritis   . Venous insufficiency     lower extremities  . Cholecystitis   . AAA (abdominal aortic aneurysm) ([place])     followed by Dr. Sherren Mocha Early  . Hyperlipidemia   . Clotting disorder ([place])     history of DVT  . Arthritis   . Lung nodules   . Aortic aneurysm without rupture ([place])   . Hypertension     Past Surgical History  Procedure Laterality Date  . Cholecystectomy    . Inguinal hernia repair    . Testicle surgery  2004    Dr. [name]  . Cataract extraction w/phaco  10/21/2010    Procedure: CATARACT EXTRACTION PHACO AND INTRAOCULAR LENS PLACEMENT (IOC);  Surgeon: Tonny Branch;  Location: [facility];  Service: Ophthalmology;  Laterality: Left;  CDE:16.80  . Yag laser application Right [id number]    Procedure: YAG LASER APPLICATION;  Surgeon: [name], MD;  Location: [facility];  Service: Ophthalmology;  Laterality: Right;    There were no vitals filed for this visit.                  Wound Therapy - 08/31/15 1605    Subjective Pt states that he is having increased pain with his legs especally  his Rt foot.  He is not sure if it was wrapped to tightly or if he is getting gout   Patient and Family Stated Goals wound to heal    Date of Onset 06/30/15   Prior Treatments antibiotics, HH    Pain Assessment 0-10   Pain Score 3    Pain Type Acute pain   Pain Location Foot   Pain Orientation Right   Pain Descriptors / Indicators Aching   Pain Onset On-going   Patients Stated Pain Goal 0   Pain Intervention(s) Emotional support   Evaluation and Treatment Procedures Explained to Patient/Family Yes   Evaluation and Treatment Procedures agreed to   Wound Properties Date First Assessed: 08/05/15 Time First Assessed: 1039 Wound Type: Other (Comment) Location: Leg Location Orientation: Right;Medial Wound Description (Comments): multiple small openings that are weeping  Present on Admission: Yes Final Assessment Date: 08/12/15 Final Assessment Time: 1500   Dressing Type Abdominal pads;Compression wrap  AB and silver hydrofiber to medial aspect of thigh    Dressing Changed Changed   Dressing Status Old drainage   Dressing Change Frequency PRN   Site / Wound Assessment Dry;Granulation tissue;Red   % Wound base Red or Granulating 100%   %  Wound base Yellow 0%   % Wound base Other (Comment) --  scant to minimal weeping from thigh none from LE    Peri-wound Assessment Hemosiderin;Induration   Drainage Amount Moderate  to copious drainage from medial ankle and thigh    Drainage Description Purulent;Green   Treatment Cleansed;Debridement (Selective)   Wound Properties Date First Assessed: 07/29/15 Time First Assessed: [id number] Wound Type: Puncture Location: Leg Location Orientation: Left;Lower Present on Admission: Yes   Dressing Type Silver hydrofiber;Alginate;Abdominal pads;Compression wrap  Profore   Dressing Changed Changed   Dressing Status Old drainage   Dressing Change Frequency Monday, Wednesday, Friday   Site / Wound Assessment Bleeding;Friable;Yellow   % Wound base Red or Granulating  60%  was 50% at last measurement   % Wound base Yellow 40%  was 100% (after debridement.)   Peri-wound Assessment Hemosiderin;Edema   Wound Length (cm) 4.5 cm   Wound Width (cm) 2.5 cm   Wound Depth (cm) 1.2 cm   Tunneling (cm) tunnel 3.5 cm at 4 o'clock    Undermining (cm) superior .8; med .5; inferior .5 ; lateral .8   Drainage Amount Copious   Drainage Description Purulent;Odor   Treatment Cleansed;Debridement (Selective);Hydrotherapy (Pulse lavage);Other (Comment)  compression dressing    Pulsed lavage therapy - wound location wound bed Lt shin   Pulsed Lavage with Suction (psi) 4 psi   Pulsed Lavage with Suction - Normal Saline Used 1000 mL   Pulsed Lavage Tip Tip with splash shield   Selective Debridement - Location wound bed of Lt LE   Selective Debridement - Tools Used Forceps;Scalpel   Selective Debridement - Tissue Removed slough    Wound Therapy - Clinical Statement Pt has significant drainage of Lt LE wound.  When manual techniques for decongestion was performed in the lateral aspect of the wound significant purulent drainage would come up from the tunneling of the wound.  This continues for approximately five minutes.  Therapist advised pt to obtain an antiobiotic.  Pt drainage from Rt LE is decreased from compression but old drainage does have a green tint to it.  Pt will continue to benefit from skilled therapy to heal wounds.    Wound Therapy - Functional Problem List difficulty walking, pan    Factors Delaying/Impairing Wound Healing Infection - systemic/local;Immobility;Multiple medical problems;Polypharmacy;Vascular compromise   Hydrotherapy Plan Debridement;Dressing change;Patient/family education;Pulsatile lavage with suction   Wound Therapy - Frequency 3X / week   Wound Therapy - Current Recommendations PT   Wound Plan trial of using pt compression garment for Rt LE ; continue with same treatment for right .l    Dressing  silverhydrofiber to Lt knee wound as well as  Rt inner thigh, ABD pad followed by profore using extra cotton to make cone shape of LE    Manual Therapy decongestive techniques including supraclavicular, deep and superfical abdominal, route fluid using inguinal/axillary anastomsis B and LE.                    PT Short Term Goals - 08/31/15 1614    PT SHORT TERM GOAL #1   Title Pt to be able to verbalize signs and symptoms of cellulitis as well as the importance to call MD to prevent a systemic infection   Time 1   Period Weeks   Status Achieved   PT SHORT TERM GOAL #2   Title Pt pain to be decreased to no more than a 4/10 to allow pt to be able to walk for  five minutes without increased pain    Time 4   Period Weeks   Status On-going   PT SHORT TERM GOAL #3   Title Wound bed to be 100% granulated to decrease risk of infection and allow closure of wound    Time 4   Period Weeks   Status On-going   PT SHORT TERM GOAL #4   Title drainage to be scant to show decreased local infection and prevent soiling of clothes    Time 4   Period Weeks   Status On-going   PT SHORT TERM GOAL #5   Title Pt to have scant drainage from Rt LE to prevent soiling of clothes and decrease risk of infection    Time 1   Period Weeks   Status On-going           PT Long Term Goals - 08/31/15 1615    PT LONG TERM GOAL #1   Title Pt pain to be no greater than a 1/10 to allow pt to walk for up to 10 minutes without having to take rest breaks and with no increased Lt leg pain    Time 8   Period Weeks   Status On-going   PT LONG TERM GOAL #2   Title wound size to have decreased by 3x1 cm with no depth to allow pt and wife to be able to care for wound at home.    Time 8   Period Weeks   Status On-going   PT LONG TERM GOAL #3   Title Pt wound to have no drainage for reduced risk of infection    Time 8   Period Weeks   Status On-going   PT LONG TERM GOAL #4   Title Pt and wife to verbalize confidence in maintaning wound care at home  until wound is completely healed.    Time 8   Period Weeks   Status On-going   PT LONG TERM GOAL #5   Title Pt Rt LE to have no drainage to allow pt to begin wearing his juxtafit again    Time 3   Period Weeks   Status Achieved               Plan - 08/31/15 1614    Clinical Impression Statement see above   Rehab Potential Good   PT Frequency 3x / week   PT Duration 8 weeks   PT Treatment/Interventions Manual lymph drainage;Compression bandaging;Manual techniques;Other (comment)  pulse lavage and debridement    PT Next Visit Plan continue with manual, pulse lavage and dressing change.  Trial of compression garment for right LE.    If lymphedema order recieved measure legs for volume give pt order form to obtain short stretch bandages. 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 Chronic respiratory failure with hypoxia and hypercapnia ([place]) 12/11/2014  . Obesity hypoventilation  syndrome ([place]) 12/11/2014  . Addisons disease ([place]) 12/11/2014  . Noncompliance with CPAP treatment 12/11/2014  . 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 Breath shortness 10/06/2014  . Snoring 07/01/2013  . Diaphoresis 07/01/2013  . Adrenal insufficiency (Addison's disease) ([place]) 07/01/2013  . Hypogonadism male 07/01/2013  . Hypoxemia 07/01/2013  . Lung nodules   . Aortic  aneurysm without rupture ([place])   . AAA (abdominal aortic aneurysm) ([place])   . Hyperlipidemia   . Clotting disorder ([place])   . Arthritis   . Osteoarthritis   . Dyspnea on exertion 05/31/2010    Rayetta Humphrey, PT CLT [phone] 08/31/2015, 4:16 PM  [place] [address] [place], [place], [address] Phone: [phone]   Fax:  [phone]  Name: [name] MRN: [id number] Date of Birth: Sep 15, 1940

## 2015-09-02 ENCOUNTER — Ambulatory Visit (HOSPITAL_COMMUNITY): Payer: Medicare Other | Admitting: Physical Therapy

## 2015-09-02 DIAGNOSIS — R262 Difficulty in walking, not elsewhere classified: Secondary | ICD-10-CM | POA: Diagnosis not present

## 2015-09-02 DIAGNOSIS — L97829 Non-pressure chronic ulcer of other part of left lower leg with unspecified severity: Secondary | ICD-10-CM

## 2015-09-02 DIAGNOSIS — I890 Lymphedema, not elsewhere classified: Secondary | ICD-10-CM | POA: Diagnosis not present

## 2015-09-02 DIAGNOSIS — I83028 Varicose veins of left lower extremity with ulcer other part of lower leg: Secondary | ICD-10-CM

## 2015-09-02 DIAGNOSIS — S81022A Laceration with foreign body, left knee, initial encounter: Secondary | ICD-10-CM | POA: Diagnosis not present

## 2015-09-02 DIAGNOSIS — I83012 Varicose veins of right lower extremity with ulcer of calf: Secondary | ICD-10-CM | POA: Diagnosis not present

## 2015-09-02 DIAGNOSIS — M25562 Pain in left knee: Secondary | ICD-10-CM

## 2015-09-02 DIAGNOSIS — L97219 Non-pressure chronic ulcer of right calf with unspecified severity: Secondary | ICD-10-CM

## 2015-09-02 NOTE — Therapy (Signed)
[place], [place], [address] Phone: [phone]   Fax:  [phone]  Wound Care Therapy  Patient Details  Name: [name] MRN: [id number] Date of Birth: 1940/07/13 No Data Recorded  Encounter Date: 09/02/2015      PT End of Session - 09/02/15 1434    Visit Number 16   Number of Visits 24   Date for PT Re-Evaluation 09/04/15   Authorization Type U[employer] medicare   Authorization Time Period cert period [id number]; Gcode complete visit 9   Authorization - Visit Number 16   Authorization - Number of Visits 19   PT Start Time 1300   PT Stop Time 1420   PT Time Calculation (min) 80 min   Activity Tolerance Patient tolerated treatment well   Behavior During Therapy [facility] for tasks assessed/performed      Past Medical History  Diagnosis Date  . Diabetes mellitus     type II  . Obesity   . Osteoarthritis   . Venous insufficiency     lower extremities  . Cholecystitis   . AAA (abdominal aortic aneurysm) ([place])     followed by Dr. Sherren Mocha Early  . Hyperlipidemia   . Clotting disorder ([place])     history of DVT  . Arthritis   . Lung nodules   . Aortic aneurysm without rupture ([place])   . Hypertension     Past Surgical History  Procedure Laterality Date  . Cholecystectomy    . Inguinal hernia repair    . Testicle surgery  2004    Dr. [name]  . Cataract extraction w/phaco  10/21/2010    Procedure: CATARACT EXTRACTION PHACO AND INTRAOCULAR LENS PLACEMENT (IOC);  Surgeon: Tonny Branch;  Location: [facility];  Service: Ophthalmology;  Laterality: Left;  CDE:16.80  . Yag laser application Right [id number]    Procedure: YAG LASER APPLICATION;  Surgeon: [name], MD;  Location: [facility];  Service: Ophthalmology;  Laterality: Right;    There were no vitals filed for this visit.                  Wound Therapy - 09/02/15 1427    Subjective Pt states that he is having increased pain with his legs especally  his Rt foot.  He is not sure if it was wrapped to tightly or if he is getting gout   Patient and Family Stated Goals wound to heal    Date of Onset 06/30/15   Prior Treatments antibiotics, HH    Pain Assessment 0-10   Pain Score 4    Pain Type Acute pain   Pain Location Ankle   Pain Orientation Right   Pain Descriptors / Indicators Aching   Pain Onset With Activity   Patients Stated Pain Goal 0   Evaluation and Treatment Procedures Explained to Patient/Family Yes   Evaluation and Treatment Procedures agreed to   Wound Properties Date First Assessed: 08/05/15 Time First Assessed: 1039 Wound Type: Other (Comment) Location: Leg Location Orientation: Right;Medial Wound Description (Comments): multiple small openings that are weeping  Present on Admission: Yes Final Assessment Date: 08/12/15 Final Assessment Time: 1500   Dressing Type Gauze (Comment);Silver hydrofiber  To medial aspect of thigh;LE donned with compression garment   Dressing Changed Changed   Dressing Status Old drainage   Dressing Change Frequency PRN   Site / Wound Assessment Dry;Granulation tissue;Red   % Wound base Red or Granulating 100%   % Wound base Yellow  0%   % Wound base Other (Comment) --  scant to minimal weeping from thigh none from LE    Peri-wound Assessment Hemosiderin;Induration   Drainage Amount Moderate  to copious drainage from medial ankle and thigh    Drainage Description Serous;No odor  from thigh only    Treatment Cleansed  manual decongestive techniques to decrease edema   Wound Properties Date First Assessed: 07/29/15 Time First Assessed: 0916 Wound Type: Puncture Location: Leg Location Orientation: Left;Lower Present on Admission: Yes   Dressing Type Silver hydrofiber;Alginate;Abdominal pads;Compression wrap  Profore   Dressing Changed Changed   Dressing Status Old drainage   Dressing Change Frequency Monday, Wednesday, Friday   Site / Wound Assessment Bleeding;Friable;Yellow   % Wound base  Red or Granulating 70%  was 50% at last measurement   % Wound base Yellow 30%  was 100% (after debridement.)   Peri-wound Assessment Hemosiderin;Edema   Drainage Amount Copious   Drainage Description Purulent;Odor   Treatment Cleansed;Debridement (Selective);Other (Comment)  decongestive manual techniques and compression bandaging    Pulsed lavage therapy - wound location wound bed Lt shin   Pulsed Lavage with Suction (psi) 4 psi   Pulsed Lavage with Suction - Normal Saline Used 1000 mL   Pulsed Lavage Tip Tip with splash shield   Selective Debridement - Location wound bed of Lt LE   Selective Debridement - Tools Used Forceps;Scalpel   Selective Debridement - Tissue Removed slough    Wound Therapy - Clinical Statement Rt LE no longer has psuedomonus drainage; Lt LE drainage remains copious but still significantly less than last week.  Wound bed continues to have increased granulation    Wound Therapy - Functional Problem List difficulty walking, pan    Factors Delaying/Impairing Wound Healing Infection - systemic/local;Immobility;Multiple medical problems;Polypharmacy;Vascular compromise   Hydrotherapy Plan Debridement;Dressing change;Patient/family education;Pulsatile lavage with suction   Wound Therapy - Frequency 3X / week   Wound Therapy - Current Recommendations PT   Wound Plan assess how compression garment does on pt Rt LE    Dressing  silverhydrofiber to Lt knee wound as well as Rt inner thigh, ABD pad followed by profore using extra cotton to make cone shape of LE    Manual Therapy decongestive techniques including supraclavicular, deep and superfical abdominal, route fluid using inguinal/axillary anastomsis B and LE.                    PT Short Term Goals - 09/02/15 1435    PT SHORT TERM GOAL #1   Title Pt to be able to verbalize signs and symptoms of cellulitis as well as the importance to call MD to prevent a systemic infection   Time 1   Period Weeks   Status  Achieved   PT SHORT TERM GOAL #2   Title Pt pain to be decreased to no more than a 4/10 to allow pt to be able to walk for five minutes without increased pain    Time 4   Period Weeks   Status On-going   PT SHORT TERM GOAL #3   Title Wound bed to be 100% granulated to decrease risk of infection and allow closure of wound    Time 4   Period Weeks   Status On-going   PT SHORT TERM GOAL #4   Title drainage to be scant to show decreased local infection and prevent soiling of clothes    Time 4   Period Weeks   Status On-going   PT  SHORT TERM GOAL #5   Title Pt to have scant drainage from Rt LE to prevent soiling of clothes and decrease risk of infection    Time 1   Period Weeks   Status On-going           PT Long Term Goals - 09/02/15 1435    PT LONG TERM GOAL #1   Title Pt pain to be no greater than a 1/10 to allow pt to walk for up to 10 minutes without having to take rest breaks and with no increased Lt leg pain    Time 8   Period Weeks   Status On-going   PT LONG TERM GOAL #2   Title wound size to have decreased by 3x1 cm with no depth to allow pt and wife to be able to care for wound at home.    Time 8   Period Weeks   Status On-going   PT LONG TERM GOAL #3   Title Pt wound to have no drainage for reduced risk of infection    Time 8   Period Weeks   Status On-going   PT LONG TERM GOAL #4   Title Pt and wife to verbalize confidence in maintaning wound care at home until wound is completely healed.    Time 8   Period Weeks   Status On-going   PT LONG TERM GOAL #5   Title Pt Rt LE to have no drainage to allow pt to begin wearing his juxtafit again    Time 3   Period Weeks   Status Achieved               Plan - 09/02/15 1434    Clinical Impression Statement see above   Rehab Potential Good   PT Frequency 3x / week   PT Duration 8 weeks   PT Treatment/Interventions Manual lymph drainage;Compression bandaging;Manual techniques;Other (comment)  pulse  lavage and debridement    PT Next Visit Plan continue with manual, pulse lavage and dressing change.     If lymphedema order recieved measure legs for volume give pt order form to obtain short stretch bandages. 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 Chronic respiratory failure with hypoxia and hypercapnia ([place]) 12/11/2014  . Obesity hypoventilation syndrome ([place]) 12/11/2014  . Addisons disease ([place]) 12/11/2014  . Noncompliance with CPAP treatment 12/11/2014  . 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 Breath shortness 10/06/2014  . Snoring 07/01/2013  . Diaphoresis 07/01/2013  . Adrenal insufficiency (Addison's disease) ([place]) 07/01/2013  . Hypogonadism male 07/01/2013  . Hypoxemia 07/01/2013  . Lung nodules   . Aortic aneurysm without rupture ([place])   . AAA (abdominal aortic aneurysm) ([place])   . Hyperlipidemia   . Clotting disorder ([place])   . Arthritis   . Osteoarthritis   . Dyspnea on exertion 05/31/2010  Rayetta Humphrey, PT CLT [phone] 09/02/2015, 2:36 PM  [facility] [address] [place], [place], [address] Phone: [phone]   Fax:  [phone]  Name: [name] MRN: [id number] Date of Birth: 1940-09-01

## 2015-09-04 ENCOUNTER — Ambulatory Visit (HOSPITAL_COMMUNITY): Payer: Medicare Other | Admitting: Physical Therapy

## 2015-09-04 DIAGNOSIS — S81022A Laceration with foreign body, left knee, initial encounter: Secondary | ICD-10-CM | POA: Diagnosis not present

## 2015-09-04 DIAGNOSIS — I890 Lymphedema, not elsewhere classified: Secondary | ICD-10-CM | POA: Diagnosis not present

## 2015-09-04 DIAGNOSIS — R262 Difficulty in walking, not elsewhere classified: Secondary | ICD-10-CM | POA: Diagnosis not present

## 2015-09-04 DIAGNOSIS — I83028 Varicose veins of left lower extremity with ulcer other part of lower leg: Secondary | ICD-10-CM

## 2015-09-04 DIAGNOSIS — I83012 Varicose veins of right lower extremity with ulcer of calf: Secondary | ICD-10-CM | POA: Diagnosis not present

## 2015-09-04 DIAGNOSIS — M25562 Pain in left knee: Secondary | ICD-10-CM

## 2015-09-04 DIAGNOSIS — L97829 Non-pressure chronic ulcer of other part of left lower leg with unspecified severity: Secondary | ICD-10-CM

## 2015-09-04 DIAGNOSIS — L97219 Non-pressure chronic ulcer of right calf with unspecified severity: Secondary | ICD-10-CM

## 2015-09-04 NOTE — Therapy (Signed)
[place], [place], [address] Phone: [phone]   Fax:  [phone]  Wound Care Therapy  Patient Details  Name: [name] MRN: [id number] Date of Birth: [birth year] No Data Recorded  Encounter Date: 09/04/2015      PT End of Session - 09/04/15 1601    Visit Number 17   Number of Visits 24   Date for PT Re-Evaluation 09/04/15   Authorization Type [employer] medicare   Authorization Time Period cert period [id number]; Gcode complete visit 9   Authorization - Visit Number 87   Authorization - Number of Visits 19   PT Start Time [id number]   PT Stop Time 1430   PT Time Calculation (min) 85 min   Activity Tolerance Patient tolerated treatment well   Behavior During Therapy [facility] for tasks assessed/performed      Past Medical History  Diagnosis Date  . Diabetes mellitus     type II  . Obesity   . Osteoarthritis   . Venous insufficiency     lower extremities  . Cholecystitis   . AAA (abdominal aortic aneurysm) ([place])     followed by Dr. Sherren Mocha Early  . Hyperlipidemia   . Clotting disorder ([place])     history of DVT  . Arthritis   . Lung nodules   . Aortic aneurysm without rupture ([place])   . Hypertension     Past Surgical History  Procedure Laterality Date  . Cholecystectomy    . Inguinal hernia repair    . Testicle surgery  2004    Dr. [name]  . Cataract extraction w/phaco  10/21/2010    Procedure: CATARACT EXTRACTION PHACO AND INTRAOCULAR LENS PLACEMENT (IOC);  Surgeon: Tonny Branch;  Location: [facility];  Service: Ophthalmology;  Laterality: Left;  CDE:16.80  . Yag laser application Right [id number]    Procedure: YAG LASER APPLICATION;  Surgeon: [name], MD;  Location: [facility];  Service: Ophthalmology;  Laterality: Right;    There were no vitals filed for this visit.       Subjective Assessment - 09/04/15 1345    Subjective Pt states that his Rt LE is not weeping but the compression garment was so tight  it was turning his foot blue.  Pt comes to the department with an ace wrap on his LE.     Currently in Pain? Yes   Pain Score 4    Pain Location Ankle   Pain Orientation Right   Pain Descriptors / Indicators Aching   Pain Type Chronic pain             LYMPHEDEMA/ONCOLOGY QUESTIONNAIRE - 09/04/15 1318    What other symptoms do you have   Are you Having Heaviness or Tightness No   Are you having Pain No   Are you having pitting edema No   Is it Hard or Difficult finding clothes that fit Yes   Do you have infections Yes   Lymphedema Stage   Stage STAGE 2 SPONTANEOUSLY IRREVERSIBLE   Lymphedema Assessments   Lymphedema Assessments Lower extremities   Right Lower Extremity Lymphedema   20 cm Proximal to Suprapatella 73 cm   10 cm Proximal to Suprapatella 68.3 cm   At Midpatella/Popliteal Crease 59.7 cm   30 cm Proximal to Floor at Lateral Plantar Foot 43.3 cm   20 cm Proximal to Floor at Lateral Plantar Foot 31.2 1   10  cm Proximal to Floor at Lateral Malleoli 28.7 cm  Circumference of ankle/heel 37.5 cm.   5 cm Proximal to 1st MTP Joint 26 cm   Across MTP Joint 26.3 cm   Around Proximal Great Toe 11.1 cm   Left Lower Extremity Lymphedema   20 cm Proximal to Suprapatella 69.8 cm   10 cm Proximal to Suprapatella 65.2 cm   At Midpatella/Popliteal Crease 59 cm   30 cm Proximal to Floor at Lateral Plantar Foot 37.2 cm   20 cm Proximal to Floor at Lateral Plantar Foot 27.2 cm   10 cm Proximal to Floor at Lateral Malleoli 26.5 cm   Circumference of ankle/heel 35 cm.   5 cm Proximal to 1st MTP Joint 24.6 cm   Across MTP Joint 26.1 cm   Around Proximal Great Toe 9.5 cm   Other .              Wound Therapy - 09/04/15 1346    Subjective Pt states that he is having increased pain with his legs especally his Rt foot.  He is not sure if it was wrapped to tightly or if he is getting gout   Patient and Family Stated Goals wound to heal    Date of Onset 06/30/15   Prior  Treatments antibiotics, HH    Pain Score 4    Pain Location Ankle   Evaluation and Treatment Procedures Explained to Patient/Family Yes   Evaluation and Treatment Procedures agreed to   Wound Properties Date First Assessed: 08/05/15 Time First Assessed: 1039 Wound Type: Other (Comment) Location: Leg Location Orientation: Right;Medial Wound Description (Comments): multiple small openings that are weeping  Present on Admission: Yes Final Assessment Date: 08/12/15 Final Assessment Time: 1500   Dressing Type Gauze (Comment);Silver hydrofiber  To medial aspect of thigh;LE donned with compression garment   Dressing Status Old drainage   Dressing Change Frequency PRN   Site / Wound Assessment Dry;Granulation tissue;Red   % Wound base Red or Granulating 100%   % Wound base Yellow 0%   % Wound base Other (Comment) --  scant to minimal weeping from thigh none from LE    Peri-wound Assessment Hemosiderin;Induration   Drainage Amount Moderate  to copious drainage from medial ankle and thigh    Drainage Description Serous;No odor  from thigh only    Treatment Cleansed;Other (Comment)  manual with reapplication of ace wrap.   Wound Properties Date First Assessed: 07/29/15 Time First Assessed: 0916 Wound Type: Puncture Location: Leg Location Orientation: Left;Lower Present on Admission: Yes   Dressing Type Silver hydrofiber;Alginate;Abdominal pads;Compression wrap  Profore   Dressing Changed Changed   Dressing Status Old drainage   Dressing Change Frequency Monday, Wednesday, Friday   Site / Wound Assessment Bleeding;Friable;Yellow   % Wound base Red or Granulating 75%  was 50% at last measurement   % Wound base Yellow 25%  was 100% (after debridement.)   Peri-wound Assessment Hemosiderin;Edema   Drainage Amount Copious  but each day contiinues to have slight decrease of drainage   Drainage Description Purulent;Odor   Treatment Cleansed;Debridement (Selective);Other (Comment)   Pulsed lavage  therapy - wound location wound bed Lt shin   Pulsed Lavage with Suction (psi) 4 psi   Pulsed Lavage with Suction - Normal Saline Used 1000 mL   Pulsed Lavage Tip Tip with splash shield   Selective Debridement - Location wound bed of Lt LE   Selective Debridement - Tools Used Forceps;Scalpel   Selective Debridement - Tissue Removed slough    Wound Therapy - Clinical Statement  New order for lymphedema treatment recieved.  Pt has been recieving decongestive techniques and having compression bandages with profore.  Explained to pt that we could wrap from toe to thigh to decrease thigh edema but would need short stretch bandates.  Pt is going to decide whether he wants to go this route or not.  Therapist gave pt order form for short stretch and measured LE while long sitting.  Wound continues to increase granulation tissue and decrease in purulent drainage although at this time it is still copious.  Wound continues to expel coupious purulent drainage from the tunnel when pressure is applied to pt lateral leg.     Wound Therapy - Functional Problem List difficulty walking, pan    Factors Delaying/Impairing Wound Healing Infection - systemic/local;Immobility;Multiple medical problems;Polypharmacy;Vascular compromise   Hydrotherapy Plan Debridement;Dressing change;Patient/family education;Pulsatile lavage with suction   Wound Therapy - Frequency 3X / week   Wound Therapy - Current Recommendations PT   Wound Plan Pt unable to wear compression garment due to constriction.  Explained to pt that he may benefit from juxtafit garments.  Therapist sent prescription for pump and garment to MD>    Dressing  silverhydrofiber to Lt knee wound as well as Rt inner thigh, ABD pad followed by profore using extra cotton to make cone shape of LE    Manual Therapy decongestive techniques including supraclavicular, deep and superfical abdominal, route fluid using inguinal/axillary anastomsis B and LE.                     PT Short Term Goals - 09/04/15 1602    PT SHORT TERM GOAL #1   Title Pt to be able to verbalize signs and symptoms of cellulitis as well as the importance to call MD to prevent a systemic infection   Time 1   Period Weeks   Status Achieved   PT SHORT TERM GOAL #2   Title Pt pain to be decreased to no more than a 4/10 to allow pt to be able to walk for five minutes without increased pain    Time 4   Period Weeks   Status On-going   PT SHORT TERM GOAL #3   Title Wound bed to be 100% granulated to decrease risk of infection and allow closure of wound    Time 4   Period Weeks   Status On-going   PT SHORT TERM GOAL #4   Title drainage to be scant to show decreased local infection and prevent soiling of clothes    Time 4   Period Weeks   Status On-going   PT SHORT TERM GOAL #5   Title Pt to have scant drainage from Rt LE to prevent soiling of clothes and decrease risk of infection    Time 1   Period Weeks   Status On-going           PT Long Term Goals - 09/04/15 1602    PT LONG TERM GOAL #1   Title Pt pain to be no greater than a 1/10 to allow pt to walk for up to 10 minutes without having to take rest breaks and with no increased Lt leg pain    Time 8   Period Weeks   Status On-going   PT LONG TERM GOAL #2   Title wound size to have decreased by 3x1 cm with no depth to allow pt and wife to be able to care for wound at home.    Time 8  Period Weeks   Status On-going   PT LONG TERM GOAL #3   Title Pt wound to have no drainage for reduced risk of infection    Time 8   Period Weeks   Status On-going   PT LONG TERM GOAL #4   Title Pt and wife to verbalize confidence in maintaning wound care at home until wound is completely healed.    Time 8   Period Weeks   Status On-going   PT LONG TERM GOAL #5   Title Pt Rt LE to have no drainage to allow pt to begin wearing his juxtafit again    Time 3   Period Weeks   Status Achieved                Plan - 09/04/15 1602    Clinical Impression Statement see above   Rehab Potential Good   PT Frequency 3x / week   PT Duration 8 weeks   PT Treatment/Interventions Manual lymph drainage;Compression bandaging;Manual techniques;Other (comment)  pulse lavage and debridement    PT Next Visit Plan continue with manual, pulse lavage and dressing change.   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)     Problem List Patient Active Problem List   Diagnosis Date Noted  . Chronic respiratory failure with hypoxia and hypercapnia ([place]) 12/11/2014  . Obesity hypoventilation syndrome ([place]) 12/11/2014  . Addisons disease ([place]) 12/11/2014  . Noncompliance with CPAP treatment 12/11/2014  . 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 Breath shortness 10/06/2014  . Snoring 07/01/2013  . Diaphoresis 07/01/2013  . Adrenal insufficiency (Addison's disease) ([place]) 07/01/2013  . Hypogonadism male 07/01/2013  . Hypoxemia 07/01/2013  . Lung nodules   . Aortic aneurysm without rupture ([place])   . AAA (abdominal aortic aneurysm) ([place])   . Hyperlipidemia   . Clotting disorder ([place])   . Arthritis   . Osteoarthritis   . Dyspnea on exertion 05/31/2010   Rayetta Humphrey, PT CLT [phone] 09/04/2015, 4:03 PM  [facility] [address] [place], [place], [address] Phone: [phone]   Fax:  [phone]  Name: [name] MRN: [id number] Date of Birth: 1940-05-12

## 2015-09-07 ENCOUNTER — Ambulatory Visit (HOSPITAL_COMMUNITY): Payer: Medicare Other | Admitting: Physical Therapy

## 2015-09-07 DIAGNOSIS — I83028 Varicose veins of left lower extremity with ulcer other part of lower leg: Secondary | ICD-10-CM | POA: Diagnosis not present

## 2015-09-07 DIAGNOSIS — R262 Difficulty in walking, not elsewhere classified: Secondary | ICD-10-CM

## 2015-09-07 DIAGNOSIS — I890 Lymphedema, not elsewhere classified: Secondary | ICD-10-CM | POA: Diagnosis not present

## 2015-09-07 DIAGNOSIS — I83012 Varicose veins of right lower extremity with ulcer of calf: Secondary | ICD-10-CM

## 2015-09-07 DIAGNOSIS — S81022A Laceration with foreign body, left knee, initial encounter: Secondary | ICD-10-CM | POA: Diagnosis not present

## 2015-09-07 DIAGNOSIS — M25562 Pain in left knee: Secondary | ICD-10-CM | POA: Diagnosis not present

## 2015-09-07 DIAGNOSIS — L97829 Non-pressure chronic ulcer of other part of left lower leg with unspecified severity: Secondary | ICD-10-CM

## 2015-09-07 DIAGNOSIS — L97219 Non-pressure chronic ulcer of right calf with unspecified severity: Secondary | ICD-10-CM

## 2015-09-07 NOTE — Therapy (Addendum)
[place], [place], [address] Phone: [phone]   Fax:  [phone]  Wound Care Therapy  Patient Details  Name: [name] MRN: [id number] Date of Birth: 1940-10-25 No Data Recorded  Encounter Date: 09/07/2015      PT End of Session - 09/07/15 1436    Visit Number 18   Number of Visits 24   Date for PT Re-Evaluation 09/04/15   Authorization Type [employer] medicare   Authorization Time Period 61   Authorization - Visit Number 18   Authorization - Number of Visits 28   PT Start Time 1300   PT Stop Time 1415   PT Time Calculation (min) 75 min   Activity Tolerance Patient tolerated treatment well   Behavior During Therapy WFL for tasks assessed/performed      Past Medical History:  Diagnosis Date  . AAA (abdominal aortic aneurysm) ([place])    followed by Dr. Sherren Mocha Early  . Aortic aneurysm without rupture ([place])   . Arthritis   . Cholecystitis   . Clotting disorder ([place])    history of DVT  . Diabetes mellitus    type II  . Hyperlipidemia   . Hypertension   . Lung nodules   . Obesity   . Osteoarthritis   . Venous insufficiency    lower extremities    Past Surgical History:  Procedure Laterality Date  . CATARACT EXTRACTION W/PHACO  10/21/2010   Procedure: CATARACT EXTRACTION PHACO AND INTRAOCULAR LENS PLACEMENT (IOC);  Surgeon: Tonny Branch;  Location: [facility];  Service: Ophthalmology;  Laterality: Left;  CDE:16.80  . CHOLECYSTECTOMY    . INGUINAL HERNIA REPAIR    . TESTICLE SURGERY  2004   Dr. [name]  . YAG LASER APPLICATION Right [id number]   Procedure: YAG LASER APPLICATION;  Surgeon: [name], MD;  Location: [facility];  Service: Ophthalmology;  Laterality: Right;    There were no vitals filed for this visit.                  Wound Therapy - 09/07/15 1424    Subjective Pt states he is having less pain but he feels that he is getting weaker and weaker    Patient and Family Stated  Goals wound to heal    Date of Onset 06/30/15   Prior Treatments antibiotics, HH    Pain Score 3    Pain Type Acute pain   Pain Location Ankle   Pain Orientation Right   Pain Descriptors / Indicators Aching   Pain Onset With Activity   Patients Stated Pain Goal 0   Pain Intervention(s) Emotional support   Evaluation and Treatment Procedures Explained to Patient/Family Yes   Evaluation and Treatment Procedures agreed to   Wound Properties Date First Assessed: 08/05/15 Time First Assessed: 1039 Wound Type: Other (Comment) Location: Leg Location Orientation: Right;Medial Wound Description (Comments): multiple small openings that are weeping  Present on Admission: Yes Final Assessment Date: 08/12/15 Final Assessment Time: 1500   Dressing Type Gauze (Comment);Silver hydrofiber  To medial aspect of thigh; Ace wrap to LE with cotton    Dressing Status Old drainage   Dressing Change Frequency PRN   Site / Wound Assessment Dry;Granulation tissue;Red   % Wound base Red or Granulating 100%   % Wound base Yellow 0%   % Wound base Other (Comment) --  scant to minimal weeping from thigh none from LE    Peri-wound Assessment Hemosiderin;Induration   Drainage  Amount Moderate  to copious drainage from medial ankle and thigh    Drainage Description Serous;No odor  from thigh only    Treatment Cleansed;Other (Comment)  dressing    Wound Properties Date First Assessed: 07/29/15 Time First Assessed: 0916 Wound Type: Puncture Location: Leg Location Orientation: Left;Lower Present on Admission: Yes   Dressing Type Silver hydrofiber;Alginate;Abdominal pads;Compression wrap  Profore   Dressing Changed Changed   Dressing Status Old drainage   Dressing Change Frequency Monday, Wednesday, Friday   Site / Wound Assessment Bleeding;Granulation tissue;Yellow   % Wound base Red or Granulating 85%  was 50% at last measurement and 0% on inital evaluation    % Wound base Yellow 15%   (after debridement.) inital  evaluation was 100%    Peri-wound Assessment Hemosiderin;Edema   Wound Depth (cm) 4 cm  at 4'o'clock tunnel    Drainage Amount Copious  but each day contiinues to have slight decrease of drainage   Drainage Description Serous;Purulent;No odor  yellow liquid drainage with lateral aspect pressure of leg   Treatment Cleansed;Debridement (Selective);Other (Comment)   Pulsed lavage therapy - wound location --   Pulsed Lavage with Suction (psi) --   Pulsed Lavage with Suction - Normal Saline Used --   Pulsed Lavage Tip --   Selective Debridement - Location wound bed of Lt LE   Selective Debridement - Tools Used Forceps;Scalpel   Selective Debridement - Tissue Removed slough    Wound Therapy - Clinical Statement Pt wound clean enough to discharge pulse lavage.  wound continues to tunnel at least 4 cm at 40'clock but continues to improve in granulation    Wound Therapy - Functional Problem List difficulty walking, pan    Factors Delaying/Impairing Wound Healing Infection - systemic/local;Immobility;Multiple medical problems;Polypharmacy;Vascular compromise   Hydrotherapy Plan Debridement;Dressing change;Patient/family education;Pulsatile lavage with suction   Wound Therapy - Frequency 3X / week   Wound Therapy - Current Recommendations PT   Wound Plan Pt unable to wear compression garment due to constriction.  Explained to pt that he may benefit from juxtafit garments.  Therapist sent prescription for pump and garment to MD>    Dressing  silverhydrofiber to Lt knee wound as well as Rt inner thigh, ABD pad followed by profore using extra cotton to make cone shape of LE    Manual Therapy decongestive techniques including supraclavicular, deep and superfical abdominal, route fluid using inguinal/axillary anastomsis B and LE.                    PT Short Term Goals - 09/07/15 1434      PT SHORT TERM GOAL #1   Title Pt to be able to verbalize signs and symptoms of cellulitis as well as  the importance to call MD to prevent a systemic infection   Time 1   Period Weeks   Status Achieved     PT SHORT TERM GOAL #2   Title Pt pain to be decreased to no more than a 4/10 to allow pt to be able to walk for five minutes without increased pain    Time 4   Period Weeks   Status Achieved     PT SHORT TERM GOAL #3   Title Wound bed to be 100% granulated to decrease risk of infection and allow closure of wound    Time 4   Period Weeks   Status On-going     PT SHORT TERM GOAL #4   Title drainage to be scant to  show decreased local infection and prevent soiling of clothes    Time 4   Period Weeks   Status On-going     PT SHORT TERM GOAL #5   Title Pt to have scant drainage from Rt LE to prevent soiling of clothes and decrease risk of infection    Time 1   Period Weeks   Status On-going           PT Long Term Goals - 2015-09-16 1434      PT LONG TERM GOAL #1   Title Pt pain to be no greater than a 1/10 to allow pt to walk for up to 10 minutes without having to take rest breaks and with no increased Lt leg pain    Time 8   Period Weeks   Status On-going     PT LONG TERM GOAL #2   Title wound size to have decreased by 3x1 cm with no depth to allow pt and wife to be able to care for wound at home.    Time 8   Period Weeks   Status On-going     PT LONG TERM GOAL #3   Title Pt wound to have no drainage for reduced risk of infection    Time 8   Period Weeks   Status On-going     PT LONG TERM GOAL #4   Title Pt and wife to verbalize confidence in maintaning wound care at home until wound is completely healed.    Time 8   Period Weeks   Status On-going     PT LONG TERM GOAL #5   Title Pt Rt LE to have no drainage to allow pt to begin wearing his juxtafit again    Time 3   Period Weeks   Status Achieved               Plan - 09/16/2015 1433    Clinical Impression Statement see above    Rehab Potential Good   PT Frequency 3x / week   PT Duration 8  weeks   PT Treatment/Interventions Manual lymph drainage;Compression bandaging;Manual techniques;Other (comment)  pulse lavage and debridement    PT Next Visit Plan continue with manual, debridement and dressing change.        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 Chronic respiratory failure with hypoxia and hypercapnia ([place]) 12/11/2014  . Obesity hypoventilation syndrome ([place]) 12/11/2014  . Addisons disease ([place]) 12/11/2014  . Noncompliance with  CPAP treatment 12/11/2014  . 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 Breath shortness 10/06/2014  . Snoring 07/01/2013  . Diaphoresis 07/01/2013  . Adrenal insufficiency (Addison's disease) ([place]) 07/01/2013  . Hypogonadism male 07/01/2013  . Hypoxemia 07/01/2013  . Lung nodules   . Aortic aneurysm without rupture ([place])   . AAA (abdominal aortic aneurysm) ([place])   . Hyperlipidemia   . Clotting disorder ([place])   . Arthritis   .  Osteoarthritis   . Dyspnea on exertion 05/31/2010    Rayetta Humphrey, PT CLT [phone] 09/07/2015, 2:37 PM  [facility] [address] [place], [place], [address] Phone: [phone]   Fax:  [phone]  Name: [name] MRN: [id number] Date of Birth: 1940-02-21

## 2015-09-08 ENCOUNTER — Ambulatory Visit
Admission: RE | Admit: 2015-09-08 | Discharge: 2015-09-08 | Disposition: A | Discharge status: Home / Self Care | Payer: Medicare Other | Source: Ambulatory Visit | Attending: Vascular Surgery | Admitting: Vascular Surgery

## 2015-09-08 DIAGNOSIS — I714 Abdominal aortic aneurysm, without rupture, unspecified: Secondary | ICD-10-CM

## 2015-09-08 MED ORDER — IOPAMIDOL (ISOVUE-370) INJECTION 76%
75.0000 mL | Freq: Once | INTRAVENOUS | Status: AC | PRN
  Administered 2015-09-08: 75 mL via INTRAVENOUS

## 2015-09-09 ENCOUNTER — Ambulatory Visit (HOSPITAL_COMMUNITY): Payer: Medicare Other | Admitting: Physical Therapy

## 2015-09-09 DIAGNOSIS — L97829 Non-pressure chronic ulcer of other part of left lower leg with unspecified severity: Secondary | ICD-10-CM

## 2015-09-09 DIAGNOSIS — I890 Lymphedema, not elsewhere classified: Secondary | ICD-10-CM | POA: Diagnosis not present

## 2015-09-09 DIAGNOSIS — R262 Difficulty in walking, not elsewhere classified: Secondary | ICD-10-CM

## 2015-09-09 DIAGNOSIS — S81022A Laceration with foreign body, left knee, initial encounter: Secondary | ICD-10-CM | POA: Diagnosis not present

## 2015-09-09 DIAGNOSIS — M25562 Pain in left knee: Secondary | ICD-10-CM

## 2015-09-09 DIAGNOSIS — I83028 Varicose veins of left lower extremity with ulcer other part of lower leg: Secondary | ICD-10-CM | POA: Diagnosis not present

## 2015-09-09 DIAGNOSIS — I83012 Varicose veins of right lower extremity with ulcer of calf: Secondary | ICD-10-CM | POA: Diagnosis not present

## 2015-09-09 NOTE — Therapy (Signed)
[place], [place], [address] Phone: [phone]   Fax:  [phone]  Wound Care Therapy  Patient Details  Name: [name] MRN: [id number] Date of Birth: 05/15/1940 No Data Recorded  Encounter Date: 09/09/2015      PT End of Session - 09/09/15 1609    Visit Number 19   Number of Visits 24   Date for PT Re-Evaluation 09/04/15   Authorization Type [employer] medicare   Authorization - Visit Number 19   Authorization - Number of Visits 28   PT Start Time 1300   PT Stop Time 1420   PT Time Calculation (min) 80 min   Activity Tolerance Patient tolerated treatment well   Behavior During Therapy [facility] for tasks assessed/performed      Past Medical History:  Diagnosis Date  . AAA (abdominal aortic aneurysm) ([place])    followed by Dr. Sherren Mocha Early  . Aortic aneurysm without rupture ([place])   . Arthritis   . Cholecystitis   . Clotting disorder ([place])    history of DVT  . Diabetes mellitus    type II  . Hyperlipidemia   . Hypertension   . Lung nodules   . Obesity   . Osteoarthritis   . Venous insufficiency    lower extremities    Past Surgical History:  Procedure Laterality Date  . CATARACT EXTRACTION W/PHACO  10/21/2010   Procedure: CATARACT EXTRACTION PHACO AND INTRAOCULAR LENS PLACEMENT (IOC);  Surgeon: Tonny Branch;  Location: [facility];  Service: Ophthalmology;  Laterality: Left;  CDE:16.80  . CHOLECYSTECTOMY    . INGUINAL HERNIA REPAIR    . TESTICLE SURGERY  2004   Dr. [name]  . YAG LASER APPLICATION Right [id number]   Procedure: YAG LASER APPLICATION;  Surgeon: [name], MD;  Location: [facility];  Service: Ophthalmology;  Laterality: Right;    There were no vitals filed for this visit.                  Wound Therapy - 09/09/15 1430    Subjective Pt states that he has no energy.  Requested a wheelchair to get back to treatment area.   Patient and Family Stated Goals wound to heal    Date of  Onset 06/30/15   Prior Treatments antibiotics, HH    Pain Score 0-No pain   Evaluation and Treatment Procedures Explained to Patient/Family Yes   Evaluation and Treatment Procedures agreed to   Wound Properties Date First Assessed: 08/05/15 Time First Assessed: 1039 Wound Type: Other (Comment) Location: Leg Location Orientation: Right;Medial Wound Description (Comments): multiple small openings that are weeping  Present on Admission: Yes Final Assessment Date: 08/12/15 Final Assessment Time: 1500   Dressing Type Gauze (Comment);Silver hydrofiber  To medial aspect of thigh; Ace wrap to LE with cotton    Dressing Status Old drainage   Dressing Change Frequency PRN   Site / Wound Assessment Dry;Granulation tissue;Red   % Wound base Red or Granulating 100%   % Wound base Yellow 0%   % Wound base Other (Comment) --  scant to minimal weeping from thigh none from LE    Peri-wound Assessment Hemosiderin;Induration   Drainage Amount Moderate  to copious drainage from medial ankle and thigh    Drainage Description Serous;No odor  from thigh only    Treatment Cleansed;Other (Comment)  manual lymph drainage and wrapping with ace wrap   Wound Properties Date First Assessed: 07/29/15 Time First Assessed: [phone]  Wound Type: Puncture Location: Leg Location Orientation: Left;Lower Present on Admission: Yes   Dressing Type Silver hydrofiber;Alginate;Abdominal pads;Compression wrap  Profore   Dressing Changed Changed   Dressing Status Old drainage   Dressing Change Frequency Monday, Wednesday, Friday   Site / Wound Assessment Bleeding;Granulation tissue;Yellow   % Wound base Red or Granulating 85%  was 50% at last measurement and 0% on inital evaluation    % Wound base Yellow 15%   (after debridement.) inital evaluation was 100%    Peri-wound Assessment Hemosiderin;Edema   Wound Length (cm) 4.3 cm   Wound Width (cm) 2 cm   Wound Depth (cm) 4 cm   Drainage Amount Copious  but each day contiinues to  have slight decrease of drainage   Drainage Description Serous;Purulent;No odor  yellow liquid drainage with lateral aspect pressure of leg   Treatment Cleansed;Debridement (Selective);Other (Comment)  manual lymph drainage    Selective Debridement - Location wound bed of Lt LE   Selective Debridement - Tools Used Forceps;Scalpel   Selective Debridement - Tissue Removed slough ; epiboled edges    Wound Therapy - Clinical Statement Pt wound continus to be increasing in granulation but continues to spout out yellow drainage from tunnel when pressure is applied to the lateral aspect of his LE.  Amount of drainage that is slowly decreasing each session.  Pt is very weak today therapist voiced her concern and stated that she felt he should go to his MD>  O2 was taken and was at 90.  Pt states that his O2 is usually between 90-92.    Wound Therapy - Functional Problem List difficulty walking, pan    Factors Delaying/Impairing Wound Healing Infection - systemic/local;Immobility;Multiple medical problems;Polypharmacy;Vascular compromise   Hydrotherapy Plan Debridement;Dressing change;Patient/family education;Pulsatile lavage with suction   Wound Therapy - Frequency 3X / week   Wound Therapy - Current Recommendations PT   Wound Plan Continue with debridement and decongestive techniques.    Dressing  silverhydrofiber to Lt knee wound as well as Rt inner thigh, ABD pad followed by profore using extra cotton to make cone shape of LE    Manual Therapy decongestive techniques including supraclavicular, deep and superfical abdominal, route fluid using inguinal/axillary anastomsis B and LE.                    PT Short Term Goals - 09/09/15 1610      PT SHORT TERM GOAL #1   Title Pt to be able to verbalize signs and symptoms of cellulitis as well as the importance to call MD to prevent a systemic infection   Time 1   Period Weeks   Status Achieved     PT SHORT TERM GOAL #2   Title Pt pain to  be decreased to no more than a 4/10 to allow pt to be able to walk for five minutes without increased pain    Time 4   Period Weeks   Status Achieved     PT SHORT TERM GOAL #3   Title Wound bed to be 100% granulated to decrease risk of infection and allow closure of wound    Time 4   Period Weeks   Status On-going     PT SHORT TERM GOAL #4   Title drainage to be scant to show decreased local infection and prevent soiling of clothes    Time 4   Period Weeks   Status On-going     PT SHORT TERM GOAL #5  Title Pt to have scant drainage from Rt LE to prevent soiling of clothes and decrease risk of infection    Time 1   Period Weeks   Status On-going           PT Long Term Goals - 09/09/15 1610      PT LONG TERM GOAL #1   Title Pt pain to be no greater than a 1/10 to allow pt to walk for up to 10 minutes without having to take rest breaks and with no increased Lt leg pain    Time 8   Period Weeks   Status On-going     PT LONG TERM GOAL #2   Title wound size to have decreased by 3x1 cm with no depth to allow pt and wife to be able to care for wound at home.    Time 8   Period Weeks   Status On-going     PT LONG TERM GOAL #3   Title Pt wound to have no drainage for reduced risk of infection    Time 8   Period Weeks   Status On-going     PT LONG TERM GOAL #4   Title Pt and wife to verbalize confidence in maintaning wound care at home until wound is completely healed.    Time 8   Period Weeks   Status On-going     PT LONG TERM GOAL #5   Title Pt Rt LE to have no drainage to allow pt to begin wearing his juxtafit again    Time 3   Period Weeks   Status Achieved               Plan - 09/09/15 1610    Clinical Impression Statement see above    Rehab Potential Good   PT Frequency 3x / week   PT Duration 8 weeks   PT Treatment/Interventions Manual lymph drainage;Compression bandaging;Manual techniques;Other (comment)  pulse lavage and debridement    PT  Next Visit Plan continue with manual, debridement and dressing change.  Give and review exercises with pt.      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 Chronic respiratory failure with hypoxia and hypercapnia ([place]) 12/11/2014  . Obesity hypoventilation syndrome ([place]) 12/11/2014  . Addisons disease ([place]) 12/11/2014  . Noncompliance with CPAP treatment 12/11/2014  . 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 Breath shortness 10/06/2014  . Snoring 07/01/2013  . Diaphoresis 07/01/2013  . Adrenal insufficiency (Addison's disease) ([place]) 07/01/2013  . Hypogonadism male 07/01/2013  . Hypoxemia 07/01/2013  . Lung nodules   . Aortic aneurysm without rupture ([place])   . AAA (abdominal aortic aneurysm) ([place])   . Hyperlipidemia   . Clotting disorder ([place])   . Arthritis   . Osteoarthritis   . Dyspnea on exertion 05/31/2010    Rayetta Humphrey, PT CLT [phone] 09/09/2015, 4:12 PM  [facility] [address]  [place], [place], [address] Phone: [phone]   Fax:  [phone]  Name: [name] MRN: [id number] Date of Birth: 10/08/1940

## 2015-09-09 NOTE — Patient Instructions (Addendum)
Knee Extension (Sitting)    Place __3__ pound weight on left ankle and straighten knee fully, lower slowly. Repeat 10-15____ times per set. Do ___1_ sets per session. Do ___2_ sessions per day.  http://orth.exer.us/732   Copyright  VHI. All rights reserved.  Functional Quadriceps: Chair Squat    Keeping feet flat on floor, shoulder width apart, squat as low as is comfortable. Use support as necessary. Repeat ___10-15_ times per set. Do _1___ sets per session. Do ____2 sessions per day.  http://orth.exer.us/736   Copyright  VHI. All rights reserved.  Bridging    Slowly raise buttocks from floor, keeping stomach tight. Repeat _10-15___ times per set. Do _12___ sets per session. Do ____ sessions per day.  http://orth.exer.us/1096   Copyright  VHI. All rights reserved.  Strengthening: Straight Leg Raise (Phase 1)    Tighten muscles on front of right thigh, then lift leg __12__ inches from surface, keeping knee locked.  Repeat __10__ times per set. Do ___1_ sets per session. Do 2____ sessions per day.  http://orth.exer.us/614   Copyright  VHI. All rights reserved.  Strengthening: Hip Abduction (Side-Lying)    Tighten muscles on front of left thigh, then lift leg _10___ inches from surface, keeping knee locked.  Repeat _10___ times per set. Do ____1 sets per session. Do ____2 sessions per day.  http://orth.exer.us/622   Copyright  VHI. All rights reserved.

## 2015-09-11 ENCOUNTER — Ambulatory Visit (HOSPITAL_COMMUNITY): Payer: Medicare Other | Admitting: Physical Therapy

## 2015-09-11 DIAGNOSIS — I83012 Varicose veins of right lower extremity with ulcer of calf: Secondary | ICD-10-CM | POA: Diagnosis not present

## 2015-09-11 DIAGNOSIS — M25562 Pain in left knee: Secondary | ICD-10-CM

## 2015-09-11 DIAGNOSIS — R262 Difficulty in walking, not elsewhere classified: Secondary | ICD-10-CM

## 2015-09-11 DIAGNOSIS — L97219 Non-pressure chronic ulcer of right calf with unspecified severity: Secondary | ICD-10-CM

## 2015-09-11 DIAGNOSIS — L97829 Non-pressure chronic ulcer of other part of left lower leg with unspecified severity: Secondary | ICD-10-CM

## 2015-09-11 DIAGNOSIS — S81022A Laceration with foreign body, left knee, initial encounter: Secondary | ICD-10-CM

## 2015-09-11 DIAGNOSIS — I83028 Varicose veins of left lower extremity with ulcer other part of lower leg: Secondary | ICD-10-CM

## 2015-09-11 DIAGNOSIS — I890 Lymphedema, not elsewhere classified: Secondary | ICD-10-CM

## 2015-09-11 NOTE — Therapy (Signed)
[place], [place], [address] Phone: [phone]   Fax:  [phone]  Physical Therapy Treatment  Patient Details  Name: [name] MRN: [id number] Date of Birth: 07-29-1940 No Data Recorded  Encounter Date: 09/11/2015      PT End of Session - 09/11/15 1423    Visit Number 20   Number of Visits 24   Date for PT Re-Evaluation 09/04/15   Authorization Type [employer] medicare   Authorization - Visit Number 20   Authorization - Number of Visits 28   PT Start Time 1300   PT Stop Time 1410   PT Time Calculation (min) 70 min   Activity Tolerance Patient tolerated treatment well   Behavior During Therapy [facility] for tasks assessed/performed      Past Medical History:  Diagnosis Date  . AAA (abdominal aortic aneurysm) ([place])    followed by Dr. Sherren Mocha Early  . Aortic aneurysm without rupture ([place])   . Arthritis   . Cholecystitis   . Clotting disorder ([place])    history of DVT  . Diabetes mellitus    type II  . Hyperlipidemia   . Hypertension   . Lung nodules   . Obesity   . Osteoarthritis   . Venous insufficiency    lower extremities    Past Surgical History:  Procedure Laterality Date  . CATARACT EXTRACTION W/PHACO  10/21/2010   Procedure: CATARACT EXTRACTION PHACO AND INTRAOCULAR LENS PLACEMENT (IOC);  Surgeon: Tonny Branch;  Location: [facility];  Service: Ophthalmology;  Laterality: Left;  CDE:16.80  . CHOLECYSTECTOMY    . INGUINAL HERNIA REPAIR    . TESTICLE SURGERY  2004   Dr. [name]  . YAG LASER APPLICATION Right [id number]   Procedure: YAG LASER APPLICATION;  Surgeon: [name], MD;  Location: [facility];  Service: Ophthalmology;  Laterality: Right;    There were no vitals filed for this visit.                     Wound Therapy - 09/11/15 1417    Subjective Pt states that he feels better; pt was able to walk back to the treatment room today.   Patient and Family Stated Goals wound to heal     Date of Onset 06/30/15   Prior Treatments antibiotics, HH    Pain Score 0-No pain   Evaluation and Treatment Procedures Explained to Patient/Family Yes   Evaluation and Treatment Procedures agreed to   Wound Properties Date First Assessed: 08/05/15 Time First Assessed: 1039 Wound Type: Other (Comment) Location: Leg Location Orientation: Right;Medial Wound Description (Comments): multiple small openings that are weeping  Present on Admission: Yes Final Assessment Date: 08/12/15 Final Assessment Time: 1500   Dressing Type Alginate;Gauze (Comment)  To medial aspect of thigh; Ace wrap to LE with cotton    Dressing Changed Changed   Dressing Status Old drainage   Dressing Change Frequency PRN   Site / Wound Assessment Dry;Granulation tissue;Red   % Wound base Red or Granulating 100%   % Wound base Yellow 0%   % Wound base Other (Comment) --  scant to minimal weeping from thigh none from LE    Peri-wound Assessment Hemosiderin;Induration   Drainage Amount Minimal   Drainage Description Serous;No odor  from thigh only    Treatment Cleansed;Other (Comment)  manual decongestive techniques    Wound Properties Date First Assessed: 07/29/15 Time First Assessed: 0916 Wound Type: Puncture Location: Leg Location Orientation:  Left;Lower Present on Admission: Yes   Dressing Type Abdominal pads;Alginate;Compression wrap  Profore   Dressing Changed Changed   Dressing Status Old drainage   Dressing Change Frequency Monday, Wednesday, Friday   Site / Wound Assessment Bleeding;Granulation tissue;Yellow   % Wound base Red or Granulating 90%  was 50% at last measurement and 0% on inital evaluation    % Wound base Yellow 10%   (after debridement.) inital evaluation was 100%    Peri-wound Assessment Hemosiderin;Edema   Wound Depth (cm) 4 cm  feeling end point    Drainage Amount Moderate  but each day contiinues to have slight decrease of drainage   Drainage Description Serous;Purulent;No odor   yellow liquid drainage with lateral aspect pressure of leg   Treatment Cleansed;Debridement (Selective);Other (Comment)   Selective Debridement - Location wound bed of Lt LE   Selective Debridement - Tools Used Forceps   Selective Debridement - Tissue Removed slough ; epiboled edges    Wound Therapy - Clinical Statement Pt continues to improve in granulation and decrease drainage.  Pt given LE strengthening exercises to complete at home to assist in regaining strength.     Wound Therapy - Functional Problem List difficulty walking, pan    Factors Delaying/Impairing Wound Healing Infection - systemic/local;Immobility;Multiple medical problems;Polypharmacy;Vascular compromise   Hydrotherapy Plan Debridement;Dressing change;Patient/family education;Pulsatile lavage with suction   Wound Therapy - Frequency 3X / week   Wound Therapy - Current Recommendations PT   Wound Plan Continue with debridement and decongestive techniques.    Dressing  alginate to Lt knee wound as well as Rt inner thigh, ABD pad followed by profore using extra cotton to make cone shape of LE    Manual Therapy decongestive techniques including supraclavicular, deep and superfical abdominal, route fluid using inguinal/axillary anastomsis B and LE.                     PT Short Term Goals - 09/11/15 1424      PT SHORT TERM GOAL #1   Title Pt to be able to verbalize signs and symptoms of cellulitis as well as the importance to call MD to prevent a systemic infection   Time 1   Period Weeks   Status Achieved     PT SHORT TERM GOAL #2   Title Pt pain to be decreased to no more than a 4/10 to allow pt to be able to walk for five minutes without increased pain    Time 4   Period Weeks   Status Achieved     PT SHORT TERM GOAL #3   Title Wound bed to be 100% granulated to decrease risk of infection and allow closure of wound    Time 4   Period Weeks   Status On-going     PT SHORT TERM GOAL #4   Title drainage  to be scant to show decreased local infection and prevent soiling of clothes    Time 4   Period Weeks   Status On-going     PT SHORT TERM GOAL #5   Title Pt to have scant drainage from Rt LE to prevent soiling of clothes and decrease risk of infection    Time 1   Period Weeks   Status On-going           PT Long Term Goals - 09/11/15 1425      PT LONG TERM GOAL #1   Title Pt pain to be no greater than a 1/10 to  allow pt to walk for up to 10 minutes without having to take rest breaks and with no increased Lt leg pain    Time 8   Period Weeks   Status Achieved     PT LONG TERM GOAL #2   Title wound size to have decreased by 3x1 cm with no depth to allow pt and wife to be able to care for wound at home.    Time 8   Period Weeks   Status On-going     PT LONG TERM GOAL #3   Title Pt wound to have no drainage for reduced risk of infection    Time 8   Period Weeks   Status On-going     PT LONG TERM GOAL #4   Title Pt and wife to verbalize confidence in maintaning wound care at home until wound is completely healed.    Time 8   Period Weeks   Status On-going     PT LONG TERM GOAL #5   Title Pt Rt LE to have no drainage to allow pt to begin wearing his juxtafit again    Time 3   Period Weeks   Status On-going               Plan - 09/11/15 1424    Clinical Impression Statement see above   Rehab Potential Good   PT Frequency 3x / week   PT Duration 8 weeks   PT Treatment/Interventions Manual lymph drainage;Compression bandaging;Manual techniques;Other (comment)  pulse lavage and debridement    PT Next Visit Plan continue with manual, debridement and dressing change.        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 Chronic respiratory failure with hypoxia and hypercapnia ([place]) 12/11/2014  . Obesity hypoventilation syndrome ([place]) 12/11/2014  . Addisons disease ([place]) 12/11/2014  . Noncompliance with CPAP treatment 12/11/2014  . 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 Breath shortness 10/06/2014  . Snoring 07/01/2013  . Diaphoresis 07/01/2013  . Adrenal insufficiency (Addison's disease) ([place]) 07/01/2013  . Hypogonadism male 07/01/2013  . Hypoxemia 07/01/2013  . Lung nodules   . Aortic aneurysm without rupture ([place])   . AAA (abdominal aortic aneurysm) ([place])   . Hyperlipidemia   . Clotting disorder ([place])   . Arthritis   . Osteoarthritis   . Dyspnea on exertion 05/31/2010    Rayetta Humphrey, PT CLT [phone] 09/11/2015, 2:26 PM  [place] [address] [place], [place], [address] Phone: [phone]   Fax:  [phone]  Name: [name] MRN: [id number] Date of Birth: 1940-11-01

## 2015-09-14 ENCOUNTER — Ambulatory Visit (HOSPITAL_COMMUNITY): Payer: Medicare Other | Admitting: Physical Therapy

## 2015-09-16 ENCOUNTER — Ambulatory Visit (HOSPITAL_COMMUNITY): Payer: Medicare Other | Attending: Internal Medicine | Admitting: Physical Therapy

## 2015-09-16 DIAGNOSIS — R262 Difficulty in walking, not elsewhere classified: Secondary | ICD-10-CM

## 2015-09-16 DIAGNOSIS — I83012 Varicose veins of right lower extremity with ulcer of calf: Secondary | ICD-10-CM | POA: Insufficient documentation

## 2015-09-16 DIAGNOSIS — S81022A Laceration with foreign body, left knee, initial encounter: Secondary | ICD-10-CM | POA: Diagnosis not present

## 2015-09-16 DIAGNOSIS — M25562 Pain in left knee: Secondary | ICD-10-CM | POA: Insufficient documentation

## 2015-09-16 DIAGNOSIS — L97829 Non-pressure chronic ulcer of other part of left lower leg with unspecified severity: Secondary | ICD-10-CM

## 2015-09-16 DIAGNOSIS — L97219 Non-pressure chronic ulcer of right calf with unspecified severity: Secondary | ICD-10-CM

## 2015-09-16 DIAGNOSIS — I83028 Varicose veins of left lower extremity with ulcer other part of lower leg: Secondary | ICD-10-CM | POA: Insufficient documentation

## 2015-09-16 DIAGNOSIS — I890 Lymphedema, not elsewhere classified: Secondary | ICD-10-CM | POA: Insufficient documentation

## 2015-09-16 NOTE — Therapy (Signed)
[place], [place], [address] Phone: [phone]   Fax:  [phone]  Wound Care Therapy  Patient Details  Name: [name] MRN: [id number] Date of Birth: 04-Sep-1940 No Data Recorded  Encounter Date: 09/16/2015      PT End of Session - 09/16/15 1629    Visit Number 21   Number of Visits 24   Date for PT Re-Evaluation 09/04/15   Authorization Type [employer] medicare   Authorization - Visit Number 21   Authorization - Number of Visits 28   PT Start Time [id number]   PT Stop Time 1500   PT Time Calculation (min) 75 min   Activity Tolerance Patient tolerated treatment well   Behavior During Therapy [facility] for tasks assessed/performed      Past Medical History:  Diagnosis Date  . AAA (abdominal aortic aneurysm) ([place])    followed by Dr. Sherren Mocha Early  . Aortic aneurysm without rupture ([place])   . Arthritis   . Cholecystitis   . Clotting disorder ([place])    history of DVT  . Diabetes mellitus    type II  . Hyperlipidemia   . Hypertension   . Lung nodules   . Obesity   . Osteoarthritis   . Venous insufficiency    lower extremities    Past Surgical History:  Procedure Laterality Date  . CATARACT EXTRACTION W/PHACO  10/21/2010   Procedure: CATARACT EXTRACTION PHACO AND INTRAOCULAR LENS PLACEMENT (IOC);  Surgeon: Tonny Branch;  Location: [facility];  Service: Ophthalmology;  Laterality: Left;  CDE:16.80  . CHOLECYSTECTOMY    . INGUINAL HERNIA REPAIR    . TESTICLE SURGERY  2004   Dr. [name]  . YAG LASER APPLICATION Right [id number]   Procedure: YAG LASER APPLICATION;  Surgeon: [name], MD;  Location: [facility];  Service: Ophthalmology;  Laterality: Right;    There were no vitals filed for this visit.                  Wound Therapy - 09/16/15 1622    Subjective Pt states that he is feeling better and is completing the exercises given to him last treatment.    Patient and Family Stated Goals wound to heal     Date of Onset 06/30/15   Prior Treatments antibiotics, HH    Pain Score 0-No pain   Evaluation and Treatment Procedures Explained to Patient/Family Yes   Evaluation and Treatment Procedures agreed to   Wound Properties Date First Assessed: 08/05/15 Time First Assessed: 1039 Wound Type: Other (Comment) Location: Leg Location Orientation: Right;Medial Wound Description (Comments): multiple small openings that are weeping  Present on Admission: Yes Final Assessment Date: 08/12/15 Final Assessment Time: 1500   Dressing Type Alginate;Gauze (Comment)  To medial aspect of thigh; Ace wrap to LE with cotton    Dressing Status Old drainage   Dressing Change Frequency PRN   Site / Wound Assessment Dry;Granulation tissue;Red   % Wound base Red or Granulating 100%  only raised areas that are weeping serous fluid markedly    % Wound base Yellow 0%   % Wound base Other (Comment) --  scant to minimal weeping from thigh none from LE    Peri-wound Assessment Induration   Drainage Amount Cop[place]   Drainage Description Serous;No odor  from thigh only    Treatment Cleansed;Other (Comment)   Wound Properties Date First Assessed: 07/29/15 Time First Assessed: 0916 Wound Type: Puncture Location: Leg Location Orientation: Left;Lower  Present on Admission: Yes   Dressing Type Abdominal pads;Compression wrap  Profore   Dressing Changed Changed   Dressing Status Old drainage   Dressing Change Frequency Monday, Wednesday, Friday   Site / Wound Assessment Bleeding;Granulation tissue;Yellow   % Wound base Red or Granulating 95%  was 50% at last measurement and 0% on inital evaluation    % Wound base Yellow 5%   (after debridement.) inital evaluation was 100%    Peri-wound Assessment Hemosiderin;Edema   Drainage Amount Moderate  but each day contiinues to have slight decrease of drainage   Drainage Description Serous;Purulent;No odor  yellow liquid drainage with lateral aspect pressure of leg   Treatment  Cleansed;Debridement (Selective)   Selective Debridement - Location wound bed of Lt LE   Selective Debridement - Tools Used Forceps   Selective Debridement - Tissue Removed slough ; epiboled edges    Wound Therapy - Clinical Statement Pt had increased induration of Lt thigh with noted increased weeping from thigh area.  After manual noted decrease in induration.  Rt wound continues to decrease in size but still has significant tunneling at 4:00.  Drainage from Rt tunnel is now light yellow and of water vicosity..  A   Wound Therapy - Functional Problem List difficulty walking, pan    Factors Delaying/Impairing Wound Healing Infection - systemic/local;Immobility;Multiple medical problems;Polypharmacy;Vascular compromise   Hydrotherapy Plan Debridement;Dressing change;Patient/family education;Pulsatile lavage with suction   Wound Therapy - Frequency 3X / week   Wound Therapy - Current Recommendations PT   Wound Plan Continue with debridement and decongestive techniques.    Dressing  alginate to Lt knee wound as well as , ABD pad followed by profore using extra cotton to make cone shape of LE    Manual Therapy decongestive techniques including supraclavicular, deep and superfical abdominal, route fluid using inguinal/axillary anastomsis B and LE.   done seperate from all other aspects of treatment.                    PT Short Term Goals - 09/16/15 1631      PT SHORT TERM GOAL #1   Title Pt to be able to verbalize signs and symptoms of cellulitis as well as the importance to call MD to prevent a systemic infection   Time 1   Period Weeks   Status Achieved     PT SHORT TERM GOAL #2   Title Pt pain to be decreased to no more than a 4/10 to allow pt to be able to walk for five minutes without increased pain    Time 4   Period Weeks   Status Achieved     PT SHORT TERM GOAL #3   Title Wound bed to be 100% granulated to decrease risk of infection and allow closure of wound    Time 4    Period Weeks   Status On-going     PT SHORT TERM GOAL #4   Title drainage to be scant to show decreased local infection and prevent soiling of clothes    Time 4   Period Weeks   Status On-going     PT SHORT TERM GOAL #5   Title Pt to have scant drainage from Rt LE to prevent soiling of clothes and decrease risk of infection    Time 1   Period Weeks   Status On-going           PT Long Term Goals - 09/16/15 [id number]  PT LONG TERM GOAL #1   Title Pt pain to be no greater than a 1/10 to allow pt to walk for up to 10 minutes without having to take rest breaks and with no increased Lt leg pain    Time 8   Period Weeks   Status Achieved     PT LONG TERM GOAL #2   Title wound size to have decreased by 3x1 cm with no depth to allow pt and wife to be able to care for wound at home.    Time 8   Period Weeks   Status On-going     PT LONG TERM GOAL #3   Title Pt wound to have no drainage for reduced risk of infection    Time 8   Period Weeks   Status On-going     PT LONG TERM GOAL #4   Title Pt and wife to verbalize confidence in maintaning wound care at home until wound is completely healed.    Time 8   Period Weeks   Status On-going     PT LONG TERM GOAL #5   Title Pt Rt LE to have no drainage to allow pt to begin wearing his juxtafit again    Time 3   Period Weeks   Status On-going               Plan - 09/16/15 1630    Clinical Impression Statement see above    Rehab Potential Good   PT Frequency 3x / week   PT Duration 8 weeks   PT Treatment/Interventions Manual lymph drainage;Compression bandaging;Manual techniques;Other (comment)  pulse lavage and debridement    PT Next Visit Plan continue with manual, debridement and dressing change.   Pt to be remeasured with new cert and extended visits next week.    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 Chronic respiratory failure with hypoxia and hypercapnia ([place]) 12/11/2014  . Obesity hypoventilation syndrome ([place]) 12/11/2014  . Addisons disease ([place]) 12/11/2014  . Noncompliance with CPAP treatment 12/11/2014  . 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 Breath shortness 10/06/2014  . Snoring 07/01/2013  . Diaphoresis 07/01/2013  . Adrenal insufficiency (Addison's disease) ([place]) 07/01/2013  . Hypogonadism male 07/01/2013  . Hypoxemia 07/01/2013  . Lung nodules   . Aortic aneurysm without rupture ([place])   . AAA (abdominal aortic aneurysm) ([place])   . Hyperlipidemia   . Clotting disorder ([place])   . Arthritis   . Osteoarthritis   . Dyspnea on exertion 05/31/2010   Rayetta Humphrey, PT CLT [phone] 09/16/2015, 4:33 PM  [facility] [address] [place], [place], [address] Phone: [phone]   Fax:  [phone]  Name: JOVANIE GUEBARA MRN: [id number] Date of Birth: [birth year]

## 2015-09-17 ENCOUNTER — Encounter: Payer: Self-pay | Admitting: Vascular Surgery

## 2015-09-17 DIAGNOSIS — R9431 Abnormal electrocardiogram [ECG] [EKG]: Secondary | ICD-10-CM | POA: Diagnosis not present

## 2015-09-17 DIAGNOSIS — I480 Paroxysmal atrial fibrillation: Secondary | ICD-10-CM | POA: Diagnosis not present

## 2015-09-18 ENCOUNTER — Ambulatory Visit (HOSPITAL_COMMUNITY): Payer: Medicare Other | Admitting: Physical Therapy

## 2015-09-18 DIAGNOSIS — I83028 Varicose veins of left lower extremity with ulcer other part of lower leg: Secondary | ICD-10-CM

## 2015-09-18 DIAGNOSIS — R262 Difficulty in walking, not elsewhere classified: Secondary | ICD-10-CM | POA: Diagnosis not present

## 2015-09-18 DIAGNOSIS — L97219 Non-pressure chronic ulcer of right calf with unspecified severity: Secondary | ICD-10-CM

## 2015-09-18 DIAGNOSIS — I890 Lymphedema, not elsewhere classified: Secondary | ICD-10-CM

## 2015-09-18 DIAGNOSIS — I83012 Varicose veins of right lower extremity with ulcer of calf: Secondary | ICD-10-CM | POA: Diagnosis not present

## 2015-09-18 DIAGNOSIS — S81022A Laceration with foreign body, left knee, initial encounter: Secondary | ICD-10-CM

## 2015-09-18 DIAGNOSIS — M25562 Pain in left knee: Secondary | ICD-10-CM | POA: Diagnosis not present

## 2015-09-18 DIAGNOSIS — L97829 Non-pressure chronic ulcer of other part of left lower leg with unspecified severity: Secondary | ICD-10-CM

## 2015-09-18 NOTE — Therapy (Signed)
[place], [place], [address] Phone: [phone]   Fax:  [phone]  Wound Care Therapy  Patient Details  Name: [name] MRN: [id number] Date of Birth: 1940-02-19 No Data Recorded  Encounter Date: 09/18/2015      PT End of Session - 09/18/15 1426    Visit Number 23   Number of Visits 24   Date for PT Re-Evaluation 09/04/15   Authorization Type [employer] medicare   Authorization - Visit Number 23   Authorization - Number of Visits 24   PT Start Time 1300   PT Stop Time 1400   PT Time Calculation (min) 60 min   Activity Tolerance Patient tolerated treatment well   Behavior During Therapy [facility] for tasks assessed/performed      Past Medical History:  Diagnosis Date  . AAA (abdominal aortic aneurysm) ([place])    followed by Dr. Sherren Mocha Early  . Aortic aneurysm without rupture ([place])   . Arthritis   . Cholecystitis   . Clotting disorder ([place])    history of DVT  . Diabetes mellitus    type II  . Hyperlipidemia   . Hypertension   . Lung nodules   . Obesity   . Osteoarthritis   . Venous insufficiency    lower extremities    Past Surgical History:  Procedure Laterality Date  . CATARACT EXTRACTION W/PHACO  10/21/2010   Procedure: CATARACT EXTRACTION PHACO AND INTRAOCULAR LENS PLACEMENT (IOC);  Surgeon: Tonny Branch;  Location: [facility];  Service: Ophthalmology;  Laterality: Left;  CDE:16.80  . CHOLECYSTECTOMY    . INGUINAL HERNIA REPAIR    . TESTICLE SURGERY  2004   Dr. [name]  . YAG LASER APPLICATION Right [id number]   Procedure: YAG LASER APPLICATION;  Surgeon: [name], MD;  Location: [facility];  Service: Ophthalmology;  Laterality: Right;    There were no vitals filed for this visit.                  Wound Therapy - 09/18/15 1417    Subjective Pt states that he continues to walk up and down his hallway and is doing his exercises    Patient and Family Stated Goals wound to heal    Date of  Onset 06/30/15   Prior Treatments antibiotics, HH    Pain Score 0-No pain   Evaluation and Treatment Procedures Explained to Patient/Family Yes   Evaluation and Treatment Procedures agreed to   Wound Properties Date First Assessed: 08/05/15 Time First Assessed: 1039 Wound Type: Other (Comment) Location: Leg Location Orientation: Right;Medial Wound Description (Comments): multiple small openings that are weeping  Present on Admission: Yes Final Assessment Date: 08/12/15 Final Assessment Time: 1500   Dressing Type Gauze (Comment);Silver hydrofiber  To medial aspect of thigh; Ace wrap to LE with cotton    Dressing Status Old drainage   Dressing Change Frequency PRN   Site / Wound Assessment Dry;Granulation tissue   % Wound base Red or Granulating 100%  only raised areas that are weeping serous fluid markedly    % Wound base Yellow 0%   % Wound base Other (Comment) --  scant to minimal weeping from thigh none from LE    Peri-wound Assessment Induration   Drainage Amount Minimal   Drainage Description Green;No odor  from thigh only    Treatment Cleansed  applied silverhydrofiber due to green color.    Wound Properties Date First Assessed: 07/29/15 Time First Assessed: [phone]  Wound Type: Puncture Location: Leg Location Orientation: Left;Lower Present on Admission: Yes   Dressing Type Compression wrap;Gauze (Comment);Hydrogel;Moist to moist  Profore   Dressing Changed Changed   Dressing Status Old drainage   Dressing Change Frequency Other (Comment)  decrease to twice a week    Site / Wound Assessment Bleeding;Granulation tissue;Yellow   % Wound base Red or Granulating 95%  was 50% at last measurement and 0% on inital evaluation    % Wound base Yellow 5%   (after debridement.) inital evaluation was 100%    Peri-wound Assessment Hemosiderin;Edema   Wound Length (cm) 4 cm  was 4.3 on 7/26   Wound Width (cm) 1.7 cm  was 2.0 on 7/26   Wound Depth (cm) 4 cm   Drainage Amount Moderate  but  each day contiinues to have slight decrease of drainage   Drainage Description Serous;Purulent;No odor  yellow liquid drainage with lateral aspect pressure of leg   Selective Debridement - Location wound bed of Lt LE   Selective Debridement - Tools Used Forceps   Selective Debridement - Tissue Removed slough ; epiboled edges    Wound Therapy - Clinical Statement Lt thigh with decreased induration but dressing has noted green tinge therefore therapist changed from alginate to silverhydrofiber dressing.  Wound on Rt LE continues to decrease in size but large tunnel at 4 o'clock remains deep, at least 4.0 cm.  Due to pt wound continuing to be granulated we will decrease to twice a week instead of three times a week.     Wound Therapy - Functional Problem List difficulty walking, pan    Factors Delaying/Impairing Wound Healing Infection - systemic/local;Immobility;Multiple medical problems;Polypharmacy;Vascular compromise   Hydrotherapy Plan Debridement;Dressing change;Patient/family education;Pulsatile lavage with suction   Wound Therapy - Frequency Other (comment)  decrease to twice a week    Wound Therapy - Current Recommendations PT   Wound Plan Continue with debridement and decongestive techniques.    Dressing  silverhydrofiber  to Lt medical thigh  followed by coverderm.  Rt knee wound hydrogel impregnated 2" kling, 4x4 profore.   Manual Therapy decongestive techniques including supraclavicular, deep and superfical abdominal, route fluid using inguinal/axillary anastomsis B and LE.   done seperate from all other aspects of treatment.                    PT Short Term Goals - 09/18/15 1429      PT SHORT TERM GOAL #1   Title Pt to be able to verbalize signs and symptoms of cellulitis as well as the importance to call MD to prevent a systemic infection   Time 1   Period Weeks   Status Achieved     PT SHORT TERM GOAL #2   Title Pt pain to be decreased to no more than a 4/10 to  allow pt to be able to walk for five minutes without increased pain    Time 4   Period Weeks   Status Achieved     PT SHORT TERM GOAL #3   Title Wound bed to be 100% granulated to decrease risk of infection and allow closure of wound    Time 4   Period Weeks   Status On-going     PT SHORT TERM GOAL #4   Title drainage to be scant to show decreased local infection and prevent soiling of clothes    Time 4   Period Weeks   Status On-going     PT SHORT TERM  GOAL #5   Title Pt to have scant drainage from Rt LE to prevent soiling of clothes and decrease risk of infection    Time 1   Period Weeks   Status On-going           PT Long Term Goals - 09/18/15 1429      PT LONG TERM GOAL #1   Title Pt pain to be no greater than a 1/10 to allow pt to walk for up to 10 minutes without having to take rest breaks and with no increased Lt leg pain    Time 8   Period Weeks   Status Achieved     PT LONG TERM GOAL #2   Title wound size to have decreased by 3x1 cm with no depth to allow pt and wife to be able to care for wound at home.    Time 8   Period Weeks   Status On-going     PT LONG TERM GOAL #3   Title Pt wound to have no drainage for reduced risk of infection    Time 8   Period Weeks   Status On-going     PT LONG TERM GOAL #4   Title Pt and wife to verbalize confidence in maintaning wound care at home until wound is completely healed.    Time 8   Period Weeks   Status On-going     PT LONG TERM GOAL #5   Title Pt Rt LE to have no drainage to allow pt to begin wearing his juxtafit again    Time 3   Period Weeks   Status On-going               Plan - 09/18/15 1428    Clinical Impression Statement see above.  Pt to be remeasured for edema and wound size for recert and continuation of treatment.    Rehab Potential Good   PT Frequency 3x / week   PT Duration 8 weeks   PT Treatment/Interventions Manual lymph drainage;Compression bandaging;Manual techniques;Other  (comment)  pulse lavage and debridement    PT Next Visit Plan continue with manual, debridement and dressing change.   Pt to be remeasured with new cert and extended visits next visit .    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 Chronic respiratory failure with hypoxia and hypercapnia ([place]) 12/11/2014  . Obesity hypoventilation syndrome ([place]) 12/11/2014  . Addisons disease ([place]) 12/11/2014  . Noncompliance with CPAP treatment 12/11/2014  . 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 Breath shortness 10/06/2014  . Snoring 07/01/2013  . Diaphoresis 07/01/2013  . Adrenal insufficiency (Addison's disease) ([place]) 07/01/2013  . Hypogonadism male 07/01/2013  . Hypoxemia 07/01/2013  . Lung nodules   . Aortic aneurysm without rupture ([place])   . AAA (abdominal aortic aneurysm) ([place])   . Hyperlipidemia   . Clotting disorder ([place])   .  Arthritis   . Osteoarthritis   . Dyspnea on exertion 05/31/2010    Rayetta Humphrey, PT CLT [phone] 09/18/2015, 2:30 PM  [place] [address] [place], [place], [address] Phone: [phone]   Fax:  [phone]  Name: [name] MRN: [id number] Date of Birth: 1940/12/18

## 2015-09-21 ENCOUNTER — Ambulatory Visit (HOSPITAL_COMMUNITY): Payer: Medicare Other | Admitting: Physical Therapy

## 2015-09-21 DIAGNOSIS — I83028 Varicose veins of left lower extremity with ulcer other part of lower leg: Secondary | ICD-10-CM

## 2015-09-21 DIAGNOSIS — R262 Difficulty in walking, not elsewhere classified: Secondary | ICD-10-CM

## 2015-09-21 DIAGNOSIS — M25562 Pain in left knee: Secondary | ICD-10-CM | POA: Diagnosis not present

## 2015-09-21 DIAGNOSIS — S81022A Laceration with foreign body, left knee, initial encounter: Secondary | ICD-10-CM | POA: Diagnosis not present

## 2015-09-21 DIAGNOSIS — I890 Lymphedema, not elsewhere classified: Secondary | ICD-10-CM | POA: Diagnosis not present

## 2015-09-21 DIAGNOSIS — I83012 Varicose veins of right lower extremity with ulcer of calf: Secondary | ICD-10-CM

## 2015-09-21 DIAGNOSIS — L97829 Non-pressure chronic ulcer of other part of left lower leg with unspecified severity: Secondary | ICD-10-CM

## 2015-09-21 DIAGNOSIS — L97219 Non-pressure chronic ulcer of right calf with unspecified severity: Secondary | ICD-10-CM

## 2015-09-21 NOTE — Addendum Note (Signed)
Addended by: Leeroy Cha on: 09/21/2015 04:28 PM   Modules accepted: Orders

## 2015-09-21 NOTE — Therapy (Addendum)
[place], [place], [address] Phone: [phone]   Fax:  [phone]  Wound Care Therapy  Patient Details  Name: [name] MRN: [id number] Date of Birth: February 09, 1941 No Data Recorded  Encounter Date: 09/21/2015      PT End of Session - 09/21/15 1433    Visit Number 24   Number of Visits 32   Date for PT Re-Evaluation 10/21/15   Authorization Type [employer] medicare   Authorization - Visit Number 24   Authorization - Number of Visits 32   PT Start Time [id number]   PT Stop Time 1420   PT Time Calculation (min) 75 min   Activity Tolerance Patient tolerated treatment well   Behavior During Therapy [facility] for tasks assessed/performed      Past Medical History:  Diagnosis Date  . AAA (abdominal aortic aneurysm) ([place])    followed by Dr. Sherren Mocha Early  . Aortic aneurysm without rupture ([place])   . Arthritis   . Cholecystitis   . Clotting disorder ([place])    history of DVT  . Diabetes mellitus    type II  . Hyperlipidemia   . Hypertension   . Lung nodules   . Obesity   . Osteoarthritis   . Venous insufficiency    lower extremities    Past Surgical History:  Procedure Laterality Date  . CATARACT EXTRACTION W/PHACO  10/21/2010   Procedure: CATARACT EXTRACTION PHACO AND INTRAOCULAR LENS PLACEMENT (IOC);  Surgeon: Tonny Branch;  Location: [facility];  Service: Ophthalmology;  Laterality: Left;  CDE:16.80  . CHOLECYSTECTOMY    . INGUINAL HERNIA REPAIR    . TESTICLE SURGERY  2004   Dr. [name]  . YAG LASER APPLICATION Right [id number]   Procedure: YAG LASER APPLICATION;  Surgeon: [name], MD;  Location: [facility];  Service: Ophthalmology;  Laterality: Right;    There were no vitals filed for this visit.            LYMPHE DEMA/ONCOLOGY QUESTIONNAIRE - 09/21/15 1353      What other symptoms do you have   Are you Having Heaviness or Tightness No   Are you having Pain No   Are you having pitting edema No   Is it  Hard or Difficult finding clothes that fit No   Do you have infections Yes     Lymphedema Stage   Stage STAGE 2 SPONTANEOUSLY IRREVERSIBLE     Right Lower Extremity Lymphedema   20 cm Proximal to Suprapatella 72.8 cm  was 73   10 cm Proximal to Suprapatella 67 cm  68.3   At Midpatella/Popliteal Crease 55.3 cm  59.7   30 cm Proximal to Floor at Lateral Plantar Foot 39.2 cm  43.3   20 cm Proximal to Floor at Lateral Plantar Foot 28.8 1  31.2   10 cm Proximal to Floor at Lateral Malleoli 26.5 cm  28.7   Circumference of ankle/heel 36.2 cm.  37.5   5 cm Proximal to 1st MTP Joint 25.7 cm  26.0   Across MTP Joint 25.8 cm  26.3   Around Proximal Great Toe 10 cm  11.1     Left Lower Extremity Lymphedema   20 cm Proximal to Suprapatella 71.3 cm  was 69.8   10 cm Proximal to Suprapatella 65 cm  was 65.2   At Midpatella/Popliteal Crease 56.6 cm  was 59.0   30 cm Proximal to Floor at Lateral Plantar Foot 38.2 cm  was37.2  20 cm Proximal to Floor at Lateral Plantar Foot 28 cm  was27.2   10 cm Proximal to Floor at Lateral Malleoli 25.2 cm  was26.5   Circumference of ankle/heel 34.3 cm.  35.0   5 cm Proximal to 1st MTP Joint 24.3 cm  24.6   Across MTP Joint 25.2 cm  23.1   Around Proximal Great Toe 9.5 cm  9.5   Other --              Wound Therapy - 09/21/15 1426    Subjective Pt states that he fell last Thursday picking something off the floor but did not hurt himself.  He is continuing to walk laps in his hallway at home.    Patient and Family Stated Goals wound to heal    Date of Onset 06/30/15   Prior Treatments antibiotics, HH    Pain Score 0-No pain   Evaluation and Treatment Procedures Explained to Patient/Family Yes   Evaluation and Treatment Procedures agreed to   Wound Properties Date First Assessed: 08/05/15 Time First Assessed: 1039 Wound Type: Other (Comment) Location: Leg Location Orientation: Right;Medial Wound Description (Comments): multiple small  openings that are weeping  Present on Admission: Yes Final Assessment Date: 08/12/15 Final Assessment Time: 1500   Dressing Type Gauze (Comment);Silver hydrofiber  To medial aspect of thigh; Ace wrap to LE with cotton    Dressing Status Old drainage   Dressing Change Frequency PRN   Site / Wound Assessment Dry;Granulation tissue   % Wound base Red or Granulating 100%  only raised areas that are weeping serous fluid markedly    % Wound base Yellow 0%   % Wound base Other (Comment) --  scant to minimal weeping from thigh none from LE    Peri-wound Assessment Edema   Drainage Amount Minimal   Drainage Description Serous;No odor  from thigh only    Treatment Cleansed  manual lymph drainage and compression bandages.   Wound Properties Date First Assessed: 07/29/15 Time First Assessed: 0916 Wound Type: Puncture Location: Leg Location Orientation: Left;Lower Present on Admission: Yes   Dressing Type Compression wrap;Gauze (Comment);Hydrogel;Moist to moist  Profore   Dressing Changed Changed   Dressing Status Old drainage   Dressing Change Frequency Other (Comment)  decrease to twice a week    Site / Wound Assessment Bleeding;Granulation tissue;Yellow   % Wound base Red or Granulating 95%  was 50% at last measurement and 0% on inital evaluation    % Wound base Yellow 5%   (after debridement.) inital evaluation was 100%    Peri-wound Assessment Hemosiderin;Edema   Wound Length (cm) 4 cm  was 4.3 on 7/26    Wound Width (cm) 1.7 cm  was 2.0 on 7/26    Wound Depth (cm) 4 cm   Drainage Amount Minimal  but each day contiinues to have slight decrease of drainage   Drainage Description Serous  yellow liquid drainage with lateral aspect pressure of leg   Treatment Cleansed;Debridement (Selective);Other (Comment)   Selective Debridement - Location wound bed of Lt LE   Selective Debridement - Tools Used Forceps   Selective Debridement - Tissue Removed slough ; epiboled edges    Wound Therapy  - Clinical Statement Pt no longer has green drainage from Rt Thigh it is now clear.  Pt remeasured with significant volume reduction in Rt LE.  Attempted compression garment once again on Rt LE .  Pt had no fluid erupt from tunnel this session.,  Wound Therapy - Functional Problem List difficulty walking, pan    Factors Delaying/Impairing Wound Healing Infection - systemic/local;Immobility;Multiple medical problems;Polypharmacy;Vascular compromise   Hydrotherapy Plan Debridement;Dressing change;Patient/family education;Pulsatile lavage with suction   Wound Therapy - Frequency Other (comment)  decrease to twice a week    Wound Therapy - Current Recommendations PT   Wound Plan Continue with debridement and decongestive techniques.    Dressing  silverhydrofiber placed in coverderm  to Lt medical thigh.  Rt knee wound hydrogel impregnated 2" kling, 4x4 profore.   Manual Therapy decongestive techniques including supraclavicular, deep and superfical abdominal, route fluid using inguinal/axillary anastomsis B and LE.   done seperate from all other aspects of treatment.                   PT Short Term Goals - 09/21/15 1520      PT SHORT TERM GOAL #1   Title Pt to be able to verbalize signs and symptoms of cellulitis as well as the importance to call MD to prevent a systemic infection   Time 1   Period Weeks   Status Achieved     PT SHORT TERM GOAL #2   Title Pt pain to be decreased to no more than a 4/10 to allow pt to be able to walk for five minutes without increased pain    Time 4   Period Weeks   Status Achieved     PT SHORT TERM GOAL #3   Title Wound bed to be 100% granulated to decrease risk of infection and allow closure of wound    Time 4   Period Weeks   Status On-going     PT SHORT TERM GOAL #4   Title drainage to be scant to show decreased local infection and prevent soiling of clothes    Time 4   Period Weeks   Status On-going     PT SHORT TERM GOAL #5    Title Pt to have scant drainage from Rt LE to prevent soiling of clothes and decrease risk of infection    Time 1   Period Weeks   Status On-going           PT Long Term Goals - 09/21/15 1520      PT LONG TERM GOAL #1   Title Pt pain to be no greater than a 1/10 to allow pt to walk for up to 10 minutes without having to take rest breaks and with no increased Lt leg pain    Time 8   Period Weeks   Status Achieved     PT LONG TERM GOAL #2   Title wound size to have decreased by 3x1 cm with no depth to allow pt and wife to be able to care for wound at home.    Time 8   Period Weeks   Status On-going     PT LONG TERM GOAL #3   Title Pt wound to have no drainage for reduced risk of infection    Time 8   Period Weeks   Status On-going     PT LONG TERM GOAL #4   Title Pt and wife to verbalize confidence in maintaning wound care at home until wound is completely healed.    Time 8   Period Weeks   Status On-going     PT LONG TERM GOAL #5   Title Pt Rt LE to have no drainage to allow pt to begin wearing his juxtafit again  Time 3   Period Weeks   Status On-going               Plan - 09/21/15 1514    Clinical Impression Statement Pt wound granulation increases; today was the first day that drainage was moderate with no expulsion of fluid from the tunnel with manual techniques.  Decongestive techniques has decreased the volume of pt Rt LE but he continues to have serous drainage from his medial thigh from pin hole openings.  This drainage is copious.   Mr. Franklin will continut to benefit from skilled therapy for debridement of his Rt knee wound as well as decongestive techniques to bilateral LE and compression bandaging to pt Rt LE to decrease volume to aide in creating a healing environment.    Rehab Potential Good   PT Frequency 2x / week   PT Duration 4 weeks  decrease pt to 2 x a week for an additional 4 weeks of therapy.  Total treatment time for 12 weeks.    PT  Treatment/Interventions Manual lymph drainage;Compression bandaging;Manual techniques;Other (comment);ADLs/Self Care Home Management  pulse lavage and debridement    PT Next Visit Plan continue with manual, debridement and dressing change.      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 Chronic respiratory failure with hypoxia and hypercapnia ([place]) 12/11/2014  . Obesity hypoventilation syndrome ([place]) 12/11/2014  . Addisons disease ([place]) 12/11/2014  . Noncompliance with CPAP treatment 12/11/2014  . 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 Breath shortness 10/06/2014  . Snoring 07/01/2013  . Diaphoresis 07/01/2013  . Adrenal insufficiency (Addison's disease) ([place]) 07/01/2013  . Hypogonadism male 07/01/2013  . Hypoxemia 07/01/2013  . Lung nodules   . Aortic aneurysm without rupture ([place])   . AAA (abdominal aortic aneurysm) ([place])   . Hyperlipidemia   . Clotting disorder ([place])   . Arthritis   . Osteoarthritis   . Dyspnea on exertion 05/31/2010    Rayetta Humphrey, PT CLT [phone] 09/21/2015, 3:22 PM  [place] [address] [place], [place], [address] Phone: [phone]   Fax:  [phone]  Name: IRE BOOTH MRN: [id number] Date of Birth: 1940/12/01

## 2015-09-21 NOTE — Addendum Note (Signed)
Addended by: Leeroy Cha on: 09/21/2015 03:27 PM   Modules accepted: Orders

## 2015-09-22 ENCOUNTER — Ambulatory Visit (INDEPENDENT_AMBULATORY_CARE_PROVIDER_SITE_OTHER): Payer: Medicare Other | Admitting: Vascular Surgery

## 2015-09-22 ENCOUNTER — Encounter: Payer: Self-pay | Admitting: Vascular Surgery

## 2015-09-22 VITALS — BP 136/80 | HR 82 | Temp 97.0°F | Resp 20 | Ht 68.0 in | Wt 274.0 lb

## 2015-09-22 DIAGNOSIS — I714 Abdominal aortic aneurysm, without rupture, unspecified: Secondary | ICD-10-CM

## 2015-09-22 NOTE — Progress Notes (Signed)
[facility]  Patient name: [name] MRN: [id number] DOB: 10-26-1940 Sex: male  REASON FOR VISIT: Follow-up of small abdominal aortic aneurysm  HPI: [name] is a 75 y.o. male here today for follow-up of small abdominal aortic aneurysm. He is here today with his wife. He is in overall failing health. Has severe respiratory compromise. Also has severe venous stasis disease and is being treated a local wound center with venous ulceration in his left leg. He has no symptoms referable to his aneurysm  Past Medical History:  Diagnosis Date  . AAA (abdominal aortic aneurysm) ([place])    followed by Dr. Sherren Mocha Safina Huard  . Aortic aneurysm without rupture ([place])   . Arthritis   . Cholecystitis   . Clotting disorder ([place])    history of DVT  . Diabetes mellitus    type II  . Hyperlipidemia   . Hypertension   . Lung nodules   . Obesity   . Osteoarthritis   . Venous insufficiency    lower extremities    Family History  Problem Relation Age of Onset  . Heart disease Mother   . Cervical cancer Mother   . Stroke Father   . Diabetes Father   . Arthritis Sister   . Cancer Brother     bladder  . Cancer Sister     cervical  . Pancreatic cancer Other   . Prostate cancer Other   . Anesthesia problems Neg Hx   . Hypotension Neg Hx   . Malignant hyperthermia Neg Hx   . Pseudochol deficiency Neg Hx     SOCIAL HISTORY: Social History  Substance Use Topics  . Smoking status: Former Smoker    Types: Cigarettes    Quit date: 07/24/1999  . Smokeless tobacco: Never Used  . Alcohol use No    Allergies  Allergen Reactions  . Aspirin Other (See Comments)    Swells my prostate  . Claritin-D [Loratadine-Pseudoephedrine Er]   . Penicillins   . Sulfa Antibiotics     Current Outpatient Prescriptions  Medication Sig Dispense Refill  . acetaminophen (TYLENOL) 650 MG CR tablet Take 650 mg by mouth every 8 (eight) hours  as needed for pain.    Marland Kitchen atorvastatin (LIPITOR) 20 MG tablet Take 20 mg by mouth at bedtime.      . clotrimazole-betamethasone (LOTRISONE) cream Apply 1 application topically 2 (two) times daily.    Marland Kitchen dexamethasone (DECADRON) 0.75 MG tablet Take 0.75 mg by mouth daily.     . furosemide (LASIX) 20 MG tablet Take 60 mg by mouth daily.     Marland Kitchen gabapentin (NEURONTIN) 100 MG tablet Take 100 mg by mouth 2 (two) times daily.     Marland Kitchen HYDROmorphone (DILAUDID) 4 MG tablet Take 4 mg by mouth every 6 (six) hours as needed for moderate pain.     . metoprolol succinate (TOPROL XL) 25 MG 24 hr tablet Take 1 tablet (25 mg total) by mouth daily. 30 tablet 6  . omeprazole (PRILOSEC) 20 MG capsule Take 20 mg by mouth daily.      . Potassium 99 MG TABS Take 1 tablet by mouth daily.    Marland Kitchen testosterone enanthate (DELATESTRYL) 200 MG/ML injection Inject into the muscle every 14 (fourteen) days. For IM use only    . warfarin (COUMADIN) 2 MG tablet Take 4 mg by mouth daily. Takes with a 7.5 mg tablet for total dose of 9.5 mg     .  predniSONE (DELTASONE) 10 MG tablet Take 10 mg by mouth daily with breakfast. Take 1 in the morning and 1/2 at hs.    . warfarin (COUMADIN) 7.5 MG tablet Take 4 mg by mouth daily. Takes with a 2 mg tablet for total dose of 9.5 mg      No current facility-administered medications for this visit.    Facility-Administered Medications Ordered in Other Visits  Medication Dose Route Frequency Provider Last Rate Last Dose  . fentaNYL (SUBLIMAZE) injection 25-50 mcg  25-50 mcg Intravenous Q5 min PRN Lerry Liner, MD        REVIEW OF SYSTEMS:  [X]  denotes positive finding, [ ]  denotes negative finding Cardiac  Comments:  Chest pain or chest pressure:    Shortness of breath upon exertion: x   Short of breath when lying flat: x   Irregular heart rhythm:        Vascular    Pain in calf, thigh, or hip brought on by ambulation:    Pain in feet at night that wakes you up from your sleep:     Blood  clot in your veins:    Leg swelling:  x       Pulmonary    Oxygen at home: x   Productive cough:     Wheezing:         Neurologic    Sudden weakness in arms or legs:     Sudden numbness in arms or legs:     Sudden onset of difficulty speaking or slurred speech:    Temporary loss of vision in one eye:     Problems with dizziness:         Gastrointestinal    Blood in stool:     Vomited blood:         Genitourinary    Burning when urinating:     Blood in urine:        Psychiatric    Major depression:         Hematologic    Bleeding problems:    Problems with blood clotting too easily:        Skin    Rashes or ulcers:        Constitutional    Fever or chills:      PHYSICAL EXAM: Vitals:   09/22/15 1524  BP: 136/80  Pulse: 82  Resp: 20  Temp: 97 F (36.1 C)  TempSrc: Oral  SpO2: 96%  Weight: 274 lb (124.3 kg)  Height: 5\' 8"  (1.727 m)    GENERAL: The patient is a well-nourished male, in no acute distress. The vital signs are documented above.Morbid obesity VASCULAR: Palpable radial pulses bilaterally PULMONARY: There is good air exchange . Has nasal oxygen therapy ABDOMEN: Soft and non-tender with morbid obesity. Cannot palpate an aneurysm MUSCULOSKELETAL: There are no major deformities or cyanosis. NEUROLOGIC: No focal weakness or paresthesias are detected. SKIN: 3 layer dressing intact in his left leg from knee up to his foot. No ulcerations on his right leg PSYCHIATRIC: The patient has a normal affect.  DATA:  CT scan was reviewed with patient and his wife. This shows a 3.4 cm aneurysm.  MEDICAL ISSUES: Table overall. Explained essentially no risk for rupture from his aneurysm. Unable to follow his aneurysm with ultrasound due to morbid obesity. We'll see him again in 3 years for repeat CT scan    Rosetta Posner, MD [facility] [phone] Pager [phone])  [phone]   

## 2015-09-23 ENCOUNTER — Ambulatory Visit (HOSPITAL_COMMUNITY): Payer: Medicare Other | Admitting: Physical Therapy

## 2015-09-23 DIAGNOSIS — S81022A Laceration with foreign body, left knee, initial encounter: Secondary | ICD-10-CM | POA: Diagnosis not present

## 2015-09-23 DIAGNOSIS — I890 Lymphedema, not elsewhere classified: Secondary | ICD-10-CM | POA: Diagnosis not present

## 2015-09-23 DIAGNOSIS — M25562 Pain in left knee: Secondary | ICD-10-CM | POA: Diagnosis not present

## 2015-09-23 DIAGNOSIS — R262 Difficulty in walking, not elsewhere classified: Secondary | ICD-10-CM | POA: Diagnosis not present

## 2015-09-23 DIAGNOSIS — I83012 Varicose veins of right lower extremity with ulcer of calf: Secondary | ICD-10-CM | POA: Diagnosis not present

## 2015-09-23 DIAGNOSIS — I83028 Varicose veins of left lower extremity with ulcer other part of lower leg: Secondary | ICD-10-CM

## 2015-09-23 DIAGNOSIS — L97829 Non-pressure chronic ulcer of other part of left lower leg with unspecified severity: Secondary | ICD-10-CM

## 2015-09-23 NOTE — Therapy (Signed)
[place], [place], [address] Phone: [phone]   Fax:  [phone]  Wound Care Therapy  Patient Details  Name: [name] MRN: [id number] Date of Birth: 30-Apr-1940 No Data Recorded  Encounter Date: 09/23/2015      PT End of Session - 09/23/15 1653    Visit Number 25   Number of Visits 32   Date for PT Re-Evaluation 10/21/15   Authorization Type [employer] medicare   Authorization - Visit Number 25   Authorization - Number of Visits 32   PT Start Time 1310   PT Stop Time 1430   PT Time Calculation (min) 80 min   Activity Tolerance Patient tolerated treatment well   Behavior During Therapy [facility] for tasks assessed/performed      Past Medical History:  Diagnosis Date  . AAA (abdominal aortic aneurysm) ([place])    followed by Dr. Sherren Mocha [place]  . Aortic aneurysm without rupture ([place])   . Arthritis   . Cholecystitis   . Clotting disorder ([place])    history of DVT  . Diabetes mellitus    type II  . Hyperlipidemia   . Hypertension   . Lung nodules   . Obesity   . Osteoarthritis   . Venous insufficiency    lower extremities    Past Surgical History:  Procedure Laterality Date  . CATARACT EXTRACTION W/PHACO  10/21/2010   Procedure: CATARACT EXTRACTION PHACO AND INTRAOCULAR LENS PLACEMENT (IOC);  Surgeon: Tonny Branch;  Location: [facility];  Service: Ophthalmology;  Laterality: Left;  CDE:16.80  . CHOLECYSTECTOMY    . INGUINAL HERNIA REPAIR    . TESTICLE SURGERY  2004   Dr. [name]  . YAG LASER APPLICATION Right [id number]   Procedure: YAG LASER APPLICATION;  Surgeon: [name], MD;  Location: [facility];  Service: Ophthalmology;  Laterality: Right;    There were no vitals filed for this visit.       Subjective Assessment - 09/23/15 1644    Subjective Pt states he has noticed less drainage from his Rt posteriomedial thigh.  States his legs are not hurting he is just so weak and OOB all the time.  Pt with profore  intact on Lt and ACE on Rt.                   Wound Therapy - 09/23/15 1645    Subjective pt states he is so tired and OOB all the time.  States his legs are not hurting and he can tell an improvement in the reduction of drainage from his Rt posteriomedial thigh.  Pt comes today with an ACE on his Rt LE and profore on his Lt LE.    Patient and Family Stated Goals wound to heal    Date of Onset 06/30/15   Prior Treatments antibiotics, HH    Pain Assessment No/denies pain   Wound Properties Date First Assessed: 08/05/15 Time First Assessed: 1039 Wound Type: Other (Comment) Location: Leg Location Orientation: Right;Medial Wound Description (Comments): multiple small openings that are weeping  Present on Admission: Yes Final Assessment Date: 08/12/15 Final Assessment Time: 1500   Dressing Type Gauze (Comment);Silver hydrofiber  To medial aspect of thigh; Ace wrap to LE with cotton    Dressing Changed Changed   Dressing Status Old drainage   Dressing Change Frequency PRN   Site / Wound Assessment Dry;Granulation tissue   % Wound base Red or Granulating 100%  only raised areas that  are weeping serous fluid markedly    % Wound base Yellow 0%   % Wound base Other (Comment) --  scant to minimal weeping from thigh none from LE    Peri-wound Assessment Edema;Hemosiderin   Drainage Amount Minimal   Drainage Description Serous;No odor  from thigh only    Treatment Cleansed   Wound Properties Date First Assessed: 07/29/15 Time First Assessed: 0916 Wound Type: Puncture Location: Leg Location Orientation: Left;Lower Present on Admission: Yes   Dressing Type Compression wrap;Gauze (Comment);Hydrogel;Moist to moist  Profore   Dressing Changed Changed   Dressing Status Old drainage   Dressing Change Frequency Other (Comment)  decrease to twice a week    Site / Wound Assessment Bleeding;Granulation tissue;Yellow   % Wound base Red or Granulating 95%  was 50% at last measurement and 0% on  inital evaluation    % Wound base Yellow 5%   (after debridement.) inital evaluation was 100%    Peri-wound Assessment Hemosiderin;Edema   Drainage Amount Minimal  but each day contiinues to have slight decrease of drainage   Drainage Description Serous  yellow liquid drainage with lateral aspect pressure of leg   Treatment Cleansed   Selective Debridement - Location wound bed of Lt LE   Selective Debridement - Tools Used Scalpel   Selective Debridement - Tissue Removed slough ; epiboled edges    Wound Therapy - Clinical Statement Continued with woundcare to bilateral LE's and manual lymph drainage for bilateral LE's.  Wound on Lt LE continues to approximate with only one tunneled area around 3 0'clock.  Able to debride remaining slough from borders of wound to promote further approximation.  Cleansed and moisturized LE's well prior to rebandaging with profore on Lt LE and reapplication of ACE to Rt LE.  Pt reported overall comfort.   Wound Therapy - Functional Problem List "   Factors Delaying/Impairing Wound Healing Infection - systemic/local;Immobility;Multiple medical problems;Polypharmacy;Vascular compromise   Hydrotherapy Plan Debridement;Dressing change;Patient/family education;Pulsatile lavage with suction   Wound Therapy - Frequency Other (comment)  decrease to twice a week    Wound Therapy - Current Recommendations PT   Wound Plan Continue with debridement and decongestive techniques.    Dressing  silverhydrofiber placed in coverderm  to Lt medical thigh.  Rt knee wound hydrogel impregnated 2" kling, 4x4 profore.   Manual Therapy decongestive techniques including supraclavicular, deep and superfical abdominal, route fluid using inguinal/axillary anastomsis B and LE.   done seperate from all other aspects of treatment.                    PT Short Term Goals - 09/21/15 1520      PT SHORT TERM GOAL #1   Title Pt to be able to verbalize signs and symptoms of cellulitis  as well as the importance to call MD to prevent a systemic infection   Time 1   Period Weeks   Status Achieved     PT SHORT TERM GOAL #2   Title Pt pain to be decreased to no more than a 4/10 to allow pt to be able to walk for five minutes without increased pain    Time 4   Period Weeks   Status Achieved     PT SHORT TERM GOAL #3   Title Wound bed to be 100% granulated to decrease risk of infection and allow closure of wound    Time 4   Period Weeks   Status On-going     PT  SHORT TERM GOAL #4   Title drainage to be scant to show decreased local infection and prevent soiling of clothes    Time 4   Period Weeks   Status On-going     PT SHORT TERM GOAL #5   Title Pt to have scant drainage from Rt LE to prevent soiling of clothes and decrease risk of infection    Time 1   Period Weeks   Status On-going           PT Long Term Goals - 09/21/15 1520      PT LONG TERM GOAL #1   Title Pt pain to be no greater than a 1/10 to allow pt to walk for up to 10 minutes without having to take rest breaks and with no increased Lt leg pain    Time 8   Period Weeks   Status Achieved     PT LONG TERM GOAL #2   Title wound size to have decreased by 3x1 cm with no depth to allow pt and wife to be able to care for wound at home.    Time 8   Period Weeks   Status On-going     PT LONG TERM GOAL #3   Title Pt wound to have no drainage for reduced risk of infection    Time 8   Period Weeks   Status On-going     PT LONG TERM GOAL #4   Title Pt and wife to verbalize confidence in maintaning wound care at home until wound is completely healed.    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 Chronic respiratory failure with hypoxia and hypercapnia ([place]) 12/11/2014  . Obesity hypoventilation syndrome ([place]) 12/11/2014  . Addisons disease ([place]) 12/11/2014  . Noncompliance with CPAP treatment 12/11/2014  . 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 Breath shortness 10/06/2014  . Snoring 07/01/2013  . Diaphoresis 07/01/2013  . Adrenal insufficiency (Addison's disease) ([place]) 07/01/2013  . Hypogonadism male 07/01/2013  . Hypoxemia 07/01/2013  . Lung nodules   . Aortic aneurysm without rupture ([place])   . AAA (abdominal aortic aneurysm) ([place])   . Hyperlipidemia   . Clotting disorder ([place])   . Arthritis   . Osteoarthritis   . Dyspnea on exertion 05/31/2010    Teena Irani, PTA/CLT [phone]  09/23/2015, 4:55 PM  [facility] [address] [place], [place], [address] Phone: [phone]   Fax:  [phone]  Name: [name] MRN: [id number] Date of Birth: [birth year]

## 2015-09-25 DIAGNOSIS — E748 Other specified disorders of carbohydrate metabolism: Secondary | ICD-10-CM | POA: Diagnosis not present

## 2015-09-25 DIAGNOSIS — I872 Venous insufficiency (chronic) (peripheral): Secondary | ICD-10-CM | POA: Diagnosis not present

## 2015-09-25 DIAGNOSIS — M1991 Primary osteoarthritis, unspecified site: Secondary | ICD-10-CM | POA: Diagnosis not present

## 2015-09-25 DIAGNOSIS — R5383 Other fatigue: Secondary | ICD-10-CM | POA: Diagnosis not present

## 2015-09-25 DIAGNOSIS — F419 Anxiety disorder, unspecified: Secondary | ICD-10-CM | POA: Diagnosis not present

## 2015-09-25 DIAGNOSIS — I10 Essential (primary) hypertension: Secondary | ICD-10-CM | POA: Diagnosis not present

## 2015-09-25 DIAGNOSIS — R0689 Other abnormalities of breathing: Secondary | ICD-10-CM | POA: Diagnosis not present

## 2015-09-25 DIAGNOSIS — Z1389 Encounter for screening for other disorder: Secondary | ICD-10-CM | POA: Diagnosis not present

## 2015-09-25 DIAGNOSIS — I714 Abdominal aortic aneurysm, without rupture: Secondary | ICD-10-CM | POA: Diagnosis not present

## 2015-09-25 DIAGNOSIS — G894 Chronic pain syndrome: Secondary | ICD-10-CM | POA: Diagnosis not present

## 2015-09-25 DIAGNOSIS — E271 Primary adrenocortical insufficiency: Secondary | ICD-10-CM | POA: Diagnosis not present

## 2015-09-25 DIAGNOSIS — E782 Mixed hyperlipidemia: Secondary | ICD-10-CM | POA: Diagnosis not present

## 2015-09-25 DIAGNOSIS — B999 Unspecified infectious disease: Secondary | ICD-10-CM | POA: Diagnosis not present

## 2015-09-27 DIAGNOSIS — J449 Chronic obstructive pulmonary disease, unspecified: Secondary | ICD-10-CM | POA: Diagnosis not present

## 2015-09-29 ENCOUNTER — Ambulatory Visit (HOSPITAL_COMMUNITY): Payer: Medicare Other | Admitting: Physical Therapy

## 2015-09-29 ENCOUNTER — Telehealth (HOSPITAL_COMMUNITY): Payer: Self-pay | Admitting: Physical Therapy

## 2015-09-29 DIAGNOSIS — S81022A Laceration with foreign body, left knee, initial encounter: Secondary | ICD-10-CM | POA: Diagnosis not present

## 2015-09-29 DIAGNOSIS — L97219 Non-pressure chronic ulcer of right calf with unspecified severity: Secondary | ICD-10-CM

## 2015-09-29 DIAGNOSIS — I83028 Varicose veins of left lower extremity with ulcer other part of lower leg: Secondary | ICD-10-CM | POA: Diagnosis not present

## 2015-09-29 DIAGNOSIS — R262 Difficulty in walking, not elsewhere classified: Secondary | ICD-10-CM | POA: Diagnosis not present

## 2015-09-29 DIAGNOSIS — L97829 Non-pressure chronic ulcer of other part of left lower leg with unspecified severity: Secondary | ICD-10-CM

## 2015-09-29 DIAGNOSIS — I890 Lymphedema, not elsewhere classified: Secondary | ICD-10-CM | POA: Diagnosis not present

## 2015-09-29 DIAGNOSIS — I83012 Varicose veins of right lower extremity with ulcer of calf: Secondary | ICD-10-CM | POA: Diagnosis not present

## 2015-09-29 DIAGNOSIS — M25562 Pain in left knee: Secondary | ICD-10-CM

## 2015-09-29 NOTE — Telephone Encounter (Signed)
Left message re: missed appointment. Reminded pt that next appointment is on Thursday at [place], [place] [facility]T [phone]

## 2015-09-29 NOTE — Therapy (Signed)
[place], [place], [address] Phone: [phone]   Fax:  [phone]  Wound Care Therapy  Patient Details  Name: [name] MRN: [id number] Date of Birth: 03/05/1940 No Data Recorded  Encounter Date: 09/29/2015      PT End of Session - 09/29/15 1623    Visit Number 26   Number of Visits 32   Date for PT Re-Evaluation 10/21/15   Authorization Type [employer] medicare   Authorization - Visit Number 26   Authorization - Number of Visits 32   PT Start Time [id number]   PT Stop Time 1607   PT Time Calculation (min) 52 min   Activity Tolerance Patient tolerated treatment well   Behavior During Therapy WFL for tasks assessed/performed      Past Medical History:  Diagnosis Date  . AAA (abdominal aortic aneurysm) ([place])    followed by Dr. Sherren Mocha Early  . Aortic aneurysm without rupture ([place])   . Arthritis   . Cholecystitis   . Clotting disorder ([place])    history of DVT  . Diabetes mellitus    type II  . Hyperlipidemia   . Hypertension   . Lung nodules   . Obesity   . Osteoarthritis   . Venous insufficiency    lower extremities    Past Surgical History:  Procedure Laterality Date  . CATARACT EXTRACTION W/PHACO  10/21/2010   Procedure: CATARACT EXTRACTION PHACO AND INTRAOCULAR LENS PLACEMENT (IOC);  Surgeon: Tonny Branch;  Location: [facility];  Service: Ophthalmology;  Laterality: Left;  CDE:16.80  . CHOLECYSTECTOMY    . INGUINAL HERNIA REPAIR    . TESTICLE SURGERY  2004   Dr. [name]  . YAG LASER APPLICATION Right [id number]   Procedure: YAG LASER APPLICATION;  Surgeon: [name], MD;  Location: [facility];  Service: Ophthalmology;  Laterality: Right;    There were no vitals filed for this visit.                  Wound Therapy - 09/29/15 1613    Subjective Pt impressed by the decrease in size of his Lt wound of which he was originally referred.   Patient and Family Stated Goals wound to heal    Date  of Onset 06/30/15   Prior Treatments antibiotics, HH    Pain Assessment No/denies pain   Wound Properties Date First Assessed: 08/05/15 Time First Assessed: 1039 Wound Type: Other (Comment) Location: Leg Location Orientation: Right;Medial Wound Description (Comments): multiple small openings that are weeping  Present on Admission: Yes Final Assessment Date: 08/12/15 Final Assessment Time: 1500   Dressing Type Gauze (Comment);Silver hydrofiber  To medial aspect of thigh; Ace wrap to LE with cotton    Dressing Changed Changed   Dressing Status Old drainage   Dressing Change Frequency PRN   Site / Wound Assessment Dry;Granulation tissue   % Wound base Red or Granulating 100%  only raised areas that are weeping serous fluid markedly    % Wound base Yellow 0%   % Wound base Other (Comment) --  scant to minimal weeping from thigh none from LE    Peri-wound Assessment Edema;Hemosiderin   Drainage Amount Moderate   Drainage Description Green;No odor  from thigh only    Treatment Cleansed  new silveralginate/coverderm applied to thigh ace wrap to LE   Wound Properties Date First Assessed: 07/29/15 Time First Assessed: 0916 Wound Type: Puncture Location: Leg Location Orientation: Left;Lower Present on Admission: Yes  Dressing Type Compression wrap;Gauze (Comment);Moist to moist  Profore   Dressing Changed Changed   Dressing Status Old drainage   Dressing Change Frequency Other (Comment)  decrease to twice a week    Site / Wound Assessment Bleeding;Granulation tissue;Yellow   % Wound base Red or Granulating --  99%   % Wound base Yellow --  1%   Peri-wound Assessment Hemosiderin;Edema   Wound Length (cm) 3.7 cm   Wound Width (cm) 1.5 cm   Wound Depth (cm) 2.5 cm   Drainage Amount Scant  but each day contiinues to have slight decrease of drainage   Drainage Description Serous  yellow liquid drainage with lateral aspect pressure of leg   Treatment Cleansed  CDT(complete decongestive  therapy)   Selective Debridement - Location --  none needed at this time.    Wound Therapy - Clinical Statement Wound is healing nicely and did not need any debridement today.  Pt had total decongestive techniques with bandaging profore.     Wound Therapy - Functional Problem List difficulty walking, edema    Factors Delaying/Impairing Wound Healing Infection - systemic/local;Immobility;Multiple medical problems;Polypharmacy;Vascular compromise   Hydrotherapy Plan Debridement;Dressing change;Patient/family education   Wound Therapy - Frequency Other (comment)  decrease to twice a week    Wound Therapy - Current Recommendations PT   Wound Plan Continue with debridement,(as needed), and decongestive techniques.    Dressing  silverhydrofiber placed in coverderm  to Lt medical thigh.  Rt knee wound covered with wet to moist  2x2 gauze  followed by dry 2x2   Manual Therapy decongestive techniques including supraclavicular, deep and superfical abdominal, route fluid using inguinal/axillary anastomsis B and LE.   done seperate from all other aspects of treatment.                  PT Education - 09/29/15 1622    Education provided Yes   Education Details given prescription for juxtafit and explained to go to [place] to be fitted.    Person(s) Educated Patient   Methods Explanation;Handout   Comprehension Verbalized understanding          PT Short Term Goals - 09/29/15 1625      PT SHORT TERM GOAL #1   Title Pt to be able to verbalize signs and symptoms of cellulitis as well as the importance to call MD to prevent a systemic infection   Time 1   Period Weeks   Status Achieved     PT SHORT TERM GOAL #2   Title Pt pain to be decreased to no more than a 4/10 to allow pt to be able to walk for five minutes without increased pain    Time 4   Period Weeks   Status Achieved     PT SHORT TERM GOAL #3   Title Wound bed to be 100% granulated to decrease risk of infection and  allow closure of wound    Time 4   Period Weeks   Status On-going     PT SHORT TERM GOAL #4   Title drainage to be scant to show decreased local infection and prevent soiling of clothes    Time 4   Period Weeks   Status On-going     PT SHORT TERM GOAL #5   Title Pt to have scant drainage from Rt LE to prevent soiling of clothes and decrease risk of infection    Time 1   Period Weeks   Status On-going  PT Long Term Goals - 09/29/15 1625      PT LONG TERM GOAL #1   Title Pt pain to be no greater than a 1/10 to allow pt to walk for up to 10 minutes without having to take rest breaks and with no increased Lt leg pain    Time 8   Period Weeks   Status Achieved     PT LONG TERM GOAL #2   Title wound size to have decreased by 3x1 cm with no depth to allow pt and wife to be able to care for wound at home.    Time 8   Period Weeks   Status On-going     PT LONG TERM GOAL #3   Title Pt wound to have no drainage for reduced risk of infection    Time 8   Period Weeks   Status On-going     PT LONG TERM GOAL #4   Title Pt and wife to verbalize confidence in maintaning wound care at home until wound is completely healed.    Time 8   Period Weeks   Status On-going     PT LONG TERM GOAL #5   Title Pt Rt LE to have no drainage to allow pt to begin wearing his juxtafit again    Time 3   Period Weeks   Status On-going               Plan - 09/29/15 1623    Clinical Impression Statement Therapist sent out for a pump for patient through [facility].  Prescription for juxtafit given to patient.    Rehab Potential Good   PT Frequency 2x / week   PT Duration 4 weeks  decrease pt to 2 x a week for an additional 4 weeks of therapy.  Total treatment time for 12 weeks.    PT Treatment/Interventions Manual lymph drainage;Compression bandaging;Manual techniques;Other (comment);ADLs/Self Care Home Management  pulse lavage and debridement    PT Next Visit Plan continue  with manual, debridement and dressing change.      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 Chronic respiratory failure with hypoxia and hypercapnia ([place]) 12/11/2014  . Obesity hypoventilation syndrome ([place]) 12/11/2014  . Addisons disease ([place]) 12/11/2014  . Noncompliance with CPAP treatment 12/11/2014  . 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 Breath shortness 10/06/2014  . Snoring 07/01/2013  . Diaphoresis 07/01/2013  . Adrenal insufficiency (Addison's disease) ([place]) 07/01/2013  . Hypogonadism male 07/01/2013  . Hypoxemia 07/01/2013  . Lung nodules   . Aortic aneurysm without rupture ([place])   . AAA (abdominal aortic aneurysm) ([place])   . Hyperlipidemia   . Clotting disorder ([place])   . Arthritis   . Osteoarthritis   . Dyspnea on exertion 05/31/2010    Rayetta Humphrey, PT CLT [phone] 09/29/2015, 4:26 PM  [place]  [place], [place], [address] Phone: [phone]   Fax:  [phone]  Name: CHAYTEN HOVANEC MRN: [id number] Date of Birth: Sep 10, 1940

## 2015-10-01 ENCOUNTER — Ambulatory Visit (HOSPITAL_COMMUNITY): Payer: Medicare Other | Admitting: Physical Therapy

## 2015-10-01 DIAGNOSIS — I890 Lymphedema, not elsewhere classified: Secondary | ICD-10-CM

## 2015-10-01 DIAGNOSIS — S81022A Laceration with foreign body, left knee, initial encounter: Secondary | ICD-10-CM | POA: Diagnosis not present

## 2015-10-01 DIAGNOSIS — L97829 Non-pressure chronic ulcer of other part of left lower leg with unspecified severity: Secondary | ICD-10-CM

## 2015-10-01 DIAGNOSIS — I83028 Varicose veins of left lower extremity with ulcer other part of lower leg: Secondary | ICD-10-CM | POA: Diagnosis not present

## 2015-10-01 DIAGNOSIS — M25562 Pain in left knee: Secondary | ICD-10-CM | POA: Diagnosis not present

## 2015-10-01 DIAGNOSIS — I83012 Varicose veins of right lower extremity with ulcer of calf: Secondary | ICD-10-CM | POA: Diagnosis not present

## 2015-10-01 DIAGNOSIS — R262 Difficulty in walking, not elsewhere classified: Secondary | ICD-10-CM | POA: Diagnosis not present

## 2015-10-01 NOTE — Therapy (Signed)
[place], [place], [address] Phone: [phone]   Fax:  [phone]  Wound Care Therapy  Patient Details  Name: [name] MRN: [id number] Date of Birth: [birth year] No Data Recorded  Encounter Date: 10/01/2015      PT End of Session - 10/01/15 1756    Visit Number 27   Number of Visits 32   Date for PT Re-Evaluation 10/21/15   Authorization Type [employer] medicare   Authorization - Visit Number 27   Authorization - Number of Visits 32   PT Start Time [id number]   PT Stop Time 1730   PT Time Calculation (min) 85 min   Activity Tolerance Patient tolerated treatment well   Behavior During Therapy [facility] for tasks assessed/performed      Past Medical History:  Diagnosis Date  . AAA (abdominal aortic aneurysm) ([name])    followed by Dr. Sherren Mocha Early  . Aortic aneurysm without rupture ([name])   . Arthritis   . Cholecystitis   . Clotting disorder ([name])    history of DVT  . Diabetes mellitus    type II  . Hyperlipidemia   . Hypertension   . Lung nodules   . Obesity   . Osteoarthritis   . Venous insufficiency    lower extremities    Past Surgical History:  Procedure Laterality Date  . CATARACT EXTRACTION W/PHACO  10/21/2010   Procedure: CATARACT EXTRACTION PHACO AND INTRAOCULAR LENS PLACEMENT (IOC);  Surgeon: [name];  Location: [facility];  Service: Ophthalmology;  Laterality: Left;  CDE:16.80  . CHOLECYSTECTOMY    . INGUINAL HERNIA REPAIR    . TESTICLE SURGERY  2004   Dr. Michela Bentley  . YAG LASER APPLICATION Right [id number]   Procedure: YAG LASER APPLICATION;  Surgeon: Scott Che, MD;  Location: [facility];  Service: Ophthalmology;  Laterality: Right;    There were no vitals filed for this visit.                  Wound Therapy - 10/01/15 1755    Subjective Wife present for treatment this session.  Pt with increased dyspnea and asked to be transported via wheelchair.   Patient and Family Stated Goals  wound to heal    Date of Onset 06/30/15   Prior Treatments antibiotics, HH    Wound Properties Date First Assessed: 08/05/15 Time First Assessed: 1039 Wound Type: Other (Comment) Location: Leg Location Orientation: Right;Medial Wound Description (Comments): multiple small openings that are weeping  Present on Admission: Yes Final Assessment Date: 08/12/15 Final Assessment Time: 1500   Dressing Type Gauze (Comment);Silver hydrofiber  To medial aspect of thigh; Ace wrap to LE with cotton    Dressing Status Old drainage   Dressing Change Frequency PRN   Site / Wound Assessment Dry;Granulation tissue   % Wound base Red or Granulating 100%  only raised areas that are weeping serous fluid markedly    % Wound base Yellow 0%   % Wound base Other (Comment) --  scant to minimal weeping from thigh none from LE    Peri-wound Assessment Edema;Hemosiderin   Drainage Amount Moderate   Drainage Description Green;No odor  from thigh only    Wound Properties Date First Assessed: 07/29/15 Time First Assessed: 0916 Wound Type: Puncture Location: Leg Location Orientation: Left;Lower Present on Admission: Yes   Dressing Type Compression wrap;Gauze (Comment);Moist to moist;Impregnated gauze (bismuth)  Profore   Dressing Status Old drainage   Dressing Change Frequency  Other (Comment)  decrease to twice a week    Site / Wound Assessment Bleeding;Granulation tissue;Yellow   % Wound base Red or Granulating --  99%   % Wound base Yellow --  1%   Peri-wound Assessment Hemosiderin;Edema   Drainage Amount Scant  but each day contiinues to have slight decrease of drainage   Drainage Description Serous  yellow liquid drainage with lateral aspect pressure of leg   Selective Debridement - Location --  none needed at this time.    Wound Therapy - Clinical Statement wound Lt anterior LE continues to heal nicely with only 0.2cm tunneled area at 5 O'clock.  Cleansed and debrided the edges and used xeroform this session  with medipore.  continued with profore on this LE.  Noted small pinhole area with approx depth of 0.2 on Rt medial thigh with constant drainage.  Used silverhydrofiber and tape dressing to secure.  Pt instructed to bring his stockings next session in hopes to get him to [facility] for measurements for juxtafits.   Wound Therapy - Functional Problem List difficulty walking, edema    Factors Delaying/Impairing Wound Healing Infection - systemic/local;Immobility;Multiple medical problems;Polypharmacy;Vascular compromise   Hydrotherapy Plan Debridement;Dressing change;Patient/family education   Wound Therapy - Frequency Other (comment)  decrease to twice a week    Wound Therapy - Current Recommendations PT   Wound Plan Continue with debridement,(as needed), and decongestive techniques.    Dressing  silverhydrofiber placed in coverderm  to Rt medial thigh.  Rt knee wound covered with xeroform  2x2 gauze  followed by dry 2x2   Manual Therapy decongestive techniques including supraclavicular, deep and superfical abdominal, route fluid using inguinal/axillary anastomsis B and LE.   done seperate from all other aspects of treatment.                    PT Short Term Goals - 09/29/15 1625      PT SHORT TERM GOAL #1   Title Pt to be able to verbalize signs and symptoms of cellulitis as well as the importance to call MD to prevent a systemic infection   Time 1   Period Weeks   Status Achieved     PT SHORT TERM GOAL #2   Title Pt pain to be decreased to no more than a 4/10 to allow pt to be able to walk for five minutes without increased pain    Time 4   Period Weeks   Status Achieved     PT SHORT TERM GOAL #3   Title Wound bed to be 100% granulated to decrease risk of infection and allow closure of wound    Time 4   Period Weeks   Status On-going     PT SHORT TERM GOAL #4   Title drainage to be scant to show decreased local infection and prevent soiling of clothes    Time 4   Period  Weeks   Status On-going     PT SHORT TERM GOAL #5   Title Pt to have scant drainage from Rt LE to prevent soiling of clothes and decrease risk of infection    Time 1   Period Weeks   Status On-going           PT Long Term Goals - 09/29/15 1625      PT LONG TERM GOAL #1   Title Pt pain to be no greater than a 1/10 to allow pt to walk for up to 10 minutes without having  to take rest breaks and with no increased Lt leg pain    Time 8   Period Weeks   Status Achieved     PT LONG TERM GOAL #2   Title wound size to have decreased by 3x1 cm with no depth to allow pt and wife to be able to care for wound at home.    Time 8   Period Weeks   Status On-going     PT LONG TERM GOAL #3   Title Pt wound to have no drainage for reduced risk of infection    Time 8   Period Weeks   Status On-going     PT LONG TERM GOAL #4   Title Pt and wife to verbalize confidence in maintaning wound care at home until wound is completely healed.    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 Chronic respiratory failure with hypoxia and hypercapnia ([place]) 12/11/2014  . Obesity hypoventilation syndrome ([place]) 12/11/2014  . Addisons disease ([place]) 12/11/2014  . Noncompliance with CPAP treatment 12/11/2014  . 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 Breath shortness 10/06/2014  . Snoring  07/01/2013  . Diaphoresis 07/01/2013  . Adrenal insufficiency (Addison's disease) ([place]) 07/01/2013  . Hypogonadism male 07/01/2013  . Hypoxemia 07/01/2013  . Lung nodules   . Aortic aneurysm without rupture ([place])   . AAA (abdominal aortic aneurysm) ([place])   . Hyperlipidemia   . Clotting disorder ([place])   . Arthritis   . Osteoarthritis   . Dyspnea on exertion 05/31/2010    Teena Irani, PTA/CLT [phone]  10/01/2015, 5:56 PM  [name] [address] [place], [place], [address] Phone: [phone]   Fax:  [phone]  Name: PROMETHEUS CRONEY MRN: [id number] Date of Birth: [birth year]

## 2015-10-05 ENCOUNTER — Ambulatory Visit (HOSPITAL_COMMUNITY): Payer: Medicare Other | Admitting: Physical Therapy

## 2015-10-05 DIAGNOSIS — I890 Lymphedema, not elsewhere classified: Secondary | ICD-10-CM | POA: Diagnosis not present

## 2015-10-05 DIAGNOSIS — I83012 Varicose veins of right lower extremity with ulcer of calf: Secondary | ICD-10-CM | POA: Diagnosis not present

## 2015-10-05 DIAGNOSIS — J449 Chronic obstructive pulmonary disease, unspecified: Secondary | ICD-10-CM | POA: Diagnosis not present

## 2015-10-05 DIAGNOSIS — S81022A Laceration with foreign body, left knee, initial encounter: Secondary | ICD-10-CM | POA: Diagnosis not present

## 2015-10-05 DIAGNOSIS — L97829 Non-pressure chronic ulcer of other part of left lower leg with unspecified severity: Secondary | ICD-10-CM

## 2015-10-05 DIAGNOSIS — M25562 Pain in left knee: Secondary | ICD-10-CM | POA: Diagnosis not present

## 2015-10-05 DIAGNOSIS — I83028 Varicose veins of left lower extremity with ulcer other part of lower leg: Secondary | ICD-10-CM | POA: Diagnosis not present

## 2015-10-05 DIAGNOSIS — R262 Difficulty in walking, not elsewhere classified: Secondary | ICD-10-CM | POA: Diagnosis not present

## 2015-10-05 NOTE — Therapy (Signed)
[place], [place], [address] Phone: [phone]   Fax:  [phone]  Wound Care Therapy  Patient Details  Name: [name] MRN: [id number] Date of Birth: 03/13/1940 No Data Recorded  Encounter Date: 10/05/2015      PT End of Session - 10/05/15 1817    Visit Number 28   Number of Visits 32   Date for PT Re-Evaluation 10/21/15   Authorization Type [employer] medicare   Authorization - Visit Number 28   Authorization - Number of Visits 32   PT Start Time [id number]   PT Stop Time 1730   PT Time Calculation (min) 85 min   Activity Tolerance Patient tolerated treatment well   Behavior During Therapy [facility] for tasks assessed/performed      Past Medical History:  Diagnosis Date  . AAA (abdominal aortic aneurysm) ([place])    followed by Dr. Sherren Mocha Early  . Aortic aneurysm without rupture ([place])   . Arthritis   . Cholecystitis   . Clotting disorder ([place])    history of DVT  . Diabetes mellitus    type II  . Hyperlipidemia   . Hypertension   . Lung nodules   . Obesity   . Osteoarthritis   . Venous insufficiency    lower extremities    Past Surgical History:  Procedure Laterality Date  . CATARACT EXTRACTION W/PHACO  10/21/2010   Procedure: CATARACT EXTRACTION PHACO AND INTRAOCULAR LENS PLACEMENT (IOC);  Surgeon: Tonny Branch;  Location: [facility];  Service: Ophthalmology;  Laterality: Left;  CDE:16.80  . CHOLECYSTECTOMY    . INGUINAL HERNIA REPAIR    . TESTICLE SURGERY  2004   Dr. [name]  . YAG LASER APPLICATION Right [id number]   Procedure: YAG LASER APPLICATION;  Surgeon: [name], MD;  Location: [facility];  Service: Ophthalmology;  Laterality: Right;    There were no vitals filed for this visit.                  Wound Therapy - 10/05/15 1810    Subjective Pt breathing better this session and able to ambulate independently to wound room.  No pain voiced   Patient and Family Stated Goals wound to heal     Date of Onset 06/30/15   Prior Treatments antibiotics, HH    Wound Properties Date First Assessed: 08/05/15 Time First Assessed: 1039 Wound Type: Other (Comment) Location: Leg Location Orientation: Right;Medial Wound Description (Comments): multiple small openings that are weeping  Present on Admission: Yes Final Assessment Date: 08/12/15 Final Assessment Time: 1500   Dressing Type Gauze (Comment);Silver hydrofiber  To medial aspect of thigh; Ace wrap to LE with cotton    Dressing Changed Changed   Dressing Status Old drainage   Dressing Change Frequency PRN   Site / Wound Assessment Dry;Granulation tissue   % Wound base Red or Granulating 100%  only raised areas that are weeping serous fluid markedly    % Wound base Yellow 0%   % Wound base Other (Comment) --  scant to minimal weeping from thigh none from LE    Peri-wound Assessment Edema;Hemosiderin   Drainage Amount Moderate   Drainage Description No odor;Serous  from thigh only    Treatment Cleansed;Debridement (Selective)   Wound Properties Date First Assessed: 07/29/15 Time First Assessed: 0916 Wound Type: Puncture Location: Leg Location Orientation: Left;Lower Present on Admission: Yes   Dressing Type Compression wrap;Gauze (Comment);Moist to moist;Impregnated gauze (bismuth)  Profore  Dressing Changed Changed   Dressing Status Old drainage   Dressing Change Frequency Other (Comment)  decrease to twice a week    Site / Wound Assessment Bleeding;Granulation tissue;Yellow   % Wound base Red or Granulating 95%  99%   % Wound base Yellow 5%  1%   Peri-wound Assessment Hemosiderin;Edema   Drainage Amount Scant  but each day contiinues to have slight decrease of drainage   Drainage Description Serous  yellow liquid drainage with lateral aspect pressure of leg   Treatment Cleansed;Debridement (Selective)   Selective Debridement - Location --  none needed at this time.    Wound Therapy - Clinical Statement Continued closure  of Lt anterior shin wound.   Less drainage noted from Rt medial LE as well.  Able to use smaller bandage this session (tape behind the leg is very painful for patient).  Pt now with pump (see pateint education) and instructed to use only when compression is off his LE's.  Pt with continued improvement.    Wound Therapy - Functional Problem List difficulty walking, edema    Factors Delaying/Impairing Wound Healing Infection - systemic/local;Immobility;Multiple medical problems;Polypharmacy;Vascular compromise   Hydrotherapy Plan Debridement;Dressing change;Patient/family education   Wound Therapy - Frequency Other (comment)  decrease to twice a week    Wound Therapy - Current Recommendations PT   Wound Plan Continue with debridement,(as needed), and decongestive techniques.    Dressing  silverhydrofiber, 1/4 ABD, medipore tape to Rt medial thigh.  Lt knee wound covered with xeroform  2x2 gauze  followed by dry 2x2   Manual Therapy --  done seperate from all other aspects of treatment.                  PT Education - 10/05/15 1816    Education provided Yes   Education Details Margie from [employer] came by with compression pump.  Ensured proper fit and educated patient/spouse on use (2X day for 1 hour treatments.  do not use over compression bandages).   Person(s) Educated Patient;Spouse   Methods Explanation;Demonstration;Tactile cues;Verbal cues;Handout   Comprehension Verbalized understanding;Returned demonstration;Verbal cues required;Tactile cues required;Need further instruction;Other (comment)          PT Short Term Goals - 09/29/15 1625      PT SHORT TERM GOAL #1   Title Pt to be able to verbalize signs and symptoms of cellulitis as well as the importance to call MD to prevent a systemic infection   Time 1   Period Weeks   Status Achieved     PT SHORT TERM GOAL #2   Title Pt pain to be decreased to no more than a 4/10 to allow pt to be able to walk for five minutes  without increased pain    Time 4   Period Weeks   Status Achieved     PT SHORT TERM GOAL #3   Title Wound bed to be 100% granulated to decrease risk of infection and allow closure of wound    Time 4   Period Weeks   Status On-going     PT SHORT TERM GOAL #4   Title drainage to be scant to show decreased local infection and prevent soiling of clothes    Time 4   Period Weeks   Status On-going     PT SHORT TERM GOAL #5   Title Pt to have scant drainage from Rt LE to prevent soiling of clothes and decrease risk of infection    Time 1  Period Weeks   Status On-going           PT Long Term Goals - 09/29/15 1625      PT LONG TERM GOAL #1   Title Pt pain to be no greater than a 1/10 to allow pt to walk for up to 10 minutes without having to take rest breaks and with no increased Lt leg pain    Time 8   Period Weeks   Status Achieved     PT LONG TERM GOAL #2   Title wound size to have decreased by 3x1 cm with no depth to allow pt and wife to be able to care for wound at home.    Time 8   Period Weeks   Status On-going     PT LONG TERM GOAL #3   Title Pt wound to have no drainage for reduced risk of infection    Time 8   Period Weeks   Status On-going     PT LONG TERM GOAL #4   Title Pt and wife to verbalize confidence in maintaning wound care at home until wound is completely healed.    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 Chronic respiratory failure with hypoxia and hypercapnia ([place]) 12/11/2014  . Obesity hypoventilation syndrome ([place]) 12/11/2014  . Addisons disease ([place]) 12/11/2014  . Noncompliance with CPAP treatment 12/11/2014  . 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 Breath shortness 10/06/2014  . Snoring 07/01/2013  . Diaphoresis 07/01/2013  . Adrenal insufficiency (Addison's disease) ([place]) 07/01/2013  . Hypogonadism male 07/01/2013  . Hypoxemia 07/01/2013  . Lung nodules   . Aortic aneurysm without rupture ([place])   . AAA (abdominal aortic aneurysm) ([place])   . Hyperlipidemia   . Clotting disorder ([place])   . Arthritis   . Osteoarthritis   . Dyspnea on exertion 05/31/2010    Teena Irani, PTA/CLT [phone]  10/05/2015, 6:20 PM  [facility] [address] [place], [place], [address] Phone: [phone]   Fax:  [phone]  Name: KEESHAUN WIGGINGTON MRN: [id number] Date of Birth: 07/24/1940

## 2015-10-07 ENCOUNTER — Telehealth (HOSPITAL_COMMUNITY): Payer: Self-pay | Admitting: Physical Therapy

## 2015-10-07 ENCOUNTER — Ambulatory Visit (HOSPITAL_COMMUNITY): Payer: Medicare Other | Admitting: Physical Therapy

## 2015-10-07 DIAGNOSIS — Z882 Allergy status to sulfonamides status: Secondary | ICD-10-CM | POA: Diagnosis not present

## 2015-10-07 DIAGNOSIS — Z9981 Dependence on supplemental oxygen: Secondary | ICD-10-CM | POA: Diagnosis not present

## 2015-10-07 DIAGNOSIS — J440 Chronic obstructive pulmonary disease with acute lower respiratory infection: Secondary | ICD-10-CM | POA: Diagnosis not present

## 2015-10-07 DIAGNOSIS — R410 Disorientation, unspecified: Secondary | ICD-10-CM | POA: Diagnosis not present

## 2015-10-07 DIAGNOSIS — J189 Pneumonia, unspecified organism: Secondary | ICD-10-CM | POA: Diagnosis not present

## 2015-10-07 DIAGNOSIS — Z886 Allergy status to analgesic agent status: Secondary | ICD-10-CM | POA: Diagnosis not present

## 2015-10-07 DIAGNOSIS — M549 Dorsalgia, unspecified: Secondary | ICD-10-CM | POA: Diagnosis not present

## 2015-10-07 DIAGNOSIS — Z79899 Other long term (current) drug therapy: Secondary | ICD-10-CM | POA: Diagnosis not present

## 2015-10-07 DIAGNOSIS — R609 Edema, unspecified: Secondary | ICD-10-CM | POA: Diagnosis not present

## 2015-10-07 DIAGNOSIS — I480 Paroxysmal atrial fibrillation: Secondary | ICD-10-CM | POA: Diagnosis not present

## 2015-10-07 DIAGNOSIS — R419 Unspecified symptoms and signs involving cognitive functions and awareness: Secondary | ICD-10-CM | POA: Diagnosis not present

## 2015-10-07 DIAGNOSIS — I10 Essential (primary) hypertension: Secondary | ICD-10-CM | POA: Diagnosis not present

## 2015-10-07 DIAGNOSIS — E271 Primary adrenocortical insufficiency: Secondary | ICD-10-CM | POA: Diagnosis not present

## 2015-10-07 DIAGNOSIS — Z87891 Personal history of nicotine dependence: Secondary | ICD-10-CM | POA: Diagnosis not present

## 2015-10-07 DIAGNOSIS — Z86718 Personal history of other venous thrombosis and embolism: Secondary | ICD-10-CM | POA: Diagnosis not present

## 2015-10-07 DIAGNOSIS — Z7901 Long term (current) use of anticoagulants: Secondary | ICD-10-CM | POA: Diagnosis not present

## 2015-10-07 DIAGNOSIS — G8929 Other chronic pain: Secondary | ICD-10-CM | POA: Diagnosis not present

## 2015-10-07 DIAGNOSIS — B9620 Unspecified Escherichia coli [E. coli] as the cause of diseases classified elsewhere: Secondary | ICD-10-CM | POA: Diagnosis not present

## 2015-10-07 DIAGNOSIS — N390 Urinary tract infection, site not specified: Secondary | ICD-10-CM | POA: Diagnosis not present

## 2015-10-07 DIAGNOSIS — R4182 Altered mental status, unspecified: Secondary | ICD-10-CM | POA: Diagnosis not present

## 2015-10-07 DIAGNOSIS — I872 Venous insufficiency (chronic) (peripheral): Secondary | ICD-10-CM | POA: Diagnosis not present

## 2015-10-07 DIAGNOSIS — Z880 Allergy status to penicillin: Secondary | ICD-10-CM | POA: Diagnosis not present

## 2015-10-07 DIAGNOSIS — Z888 Allergy status to other drugs, medicaments and biological substances status: Secondary | ICD-10-CM | POA: Diagnosis not present

## 2015-10-07 DIAGNOSIS — G4733 Obstructive sleep apnea (adult) (pediatric): Secondary | ICD-10-CM | POA: Diagnosis not present

## 2015-10-07 NOTE — Addendum Note (Signed)
Addended by: Mena Goes on: 10/07/2015 04:24 PM   Modules accepted: Orders

## 2015-10-07 NOTE — Telephone Encounter (Signed)
Therapist called patient due to being 15 minutes late for appointment.  Pt stated that he was trying to find me and that we had changed our location.  Therapist asked pt where he was and he stated that he was at "[employer]".  Therapist asked if he meant "[employer]" and pt stated no that he was in "[employer]".  Therapist asked pt to find a Dance movement psychotherapist and have him call his wife.    Meanwhile therapist called pt wife and explained the situation.  Therapist stated that she felt that Ms. Gleason needed to call her husband and find out where he physically was and get him to the ER so they could try and assess why he was confused.    Ms. Kumagai asked if pt needed to be brought to therapy.  The therapist stated that she felt the ER would be a better place to take Mr. Manos at this time.   Rayetta Humphrey, [place] CLT [phone]

## 2015-10-12 ENCOUNTER — Ambulatory Visit (HOSPITAL_COMMUNITY): Payer: Medicare Other | Admitting: Physical Therapy

## 2015-10-12 ENCOUNTER — Telehealth (HOSPITAL_COMMUNITY): Payer: Self-pay

## 2015-10-12 DIAGNOSIS — I890 Lymphedema, not elsewhere classified: Secondary | ICD-10-CM | POA: Diagnosis not present

## 2015-10-12 DIAGNOSIS — M25562 Pain in left knee: Secondary | ICD-10-CM

## 2015-10-12 DIAGNOSIS — R262 Difficulty in walking, not elsewhere classified: Secondary | ICD-10-CM

## 2015-10-12 DIAGNOSIS — I83012 Varicose veins of right lower extremity with ulcer of calf: Secondary | ICD-10-CM | POA: Diagnosis not present

## 2015-10-12 DIAGNOSIS — S81022A Laceration with foreign body, left knee, initial encounter: Secondary | ICD-10-CM

## 2015-10-12 DIAGNOSIS — I83028 Varicose veins of left lower extremity with ulcer other part of lower leg: Secondary | ICD-10-CM

## 2015-10-12 DIAGNOSIS — L97219 Non-pressure chronic ulcer of right calf with unspecified severity: Secondary | ICD-10-CM

## 2015-10-12 DIAGNOSIS — L97829 Non-pressure chronic ulcer of other part of left lower leg with unspecified severity: Secondary | ICD-10-CM

## 2015-10-12 NOTE — Therapy (Signed)
[place], [place], [address] Phone: [phone]   Fax:  [phone]  Wound Care Therapy  Patient Details  Name: [name] MRN: [id number] Date of Birth: 07/23/1940 No Data Recorded  Encounter Date: 10/12/2015      PT End of Session - 10/12/15 1659    Visit Number 29   Number of Visits 32   Date for PT Re-Evaluation 10/21/15   Authorization Type [employer] medicare   Authorization - Visit Number 29   Authorization - Number of Visits 32   PT Start Time 1520   PT Stop Time 1610   PT Time Calculation (min) 50 min   Activity Tolerance Patient tolerated treatment well   Behavior During Therapy [facility] for tasks assessed/performed      Past Medical History:  Diagnosis Date  . AAA (abdominal aortic aneurysm) ([place])    followed by Dr. Sherren Mocha Early  . Aortic aneurysm without rupture ([place])   . Arthritis   . Cholecystitis   . Clotting disorder ([place])    history of DVT  . Diabetes mellitus    type II  . Hyperlipidemia   . Hypertension   . Lung nodules   . Obesity   . Osteoarthritis   . Venous insufficiency    lower extremities    Past Surgical History:  Procedure Laterality Date  . CATARACT EXTRACTION W/PHACO  10/21/2010   Procedure: CATARACT EXTRACTION PHACO AND INTRAOCULAR LENS PLACEMENT (IOC);  Surgeon: Tonny Branch;  Location: [facility];  Service: Ophthalmology;  Laterality: Left;  CDE:16.80  . CHOLECYSTECTOMY    . INGUINAL HERNIA REPAIR    . TESTICLE SURGERY  2004   Dr. [name]  . YAG LASER APPLICATION Right [id number]   Procedure: YAG LASER APPLICATION;  Surgeon: [name], MD;  Location: [facility];  Service: Ophthalmology;  Laterality: Right;    There were no vitals filed for this visit.                  Wound Therapy - 10/12/15 1630    Subjective Pt admitted last week with pneumonia and UTI.  Pt in hospital X 3 days.  Still with difficulty breathing and weakness today.  No issues with LE's.   wearing his knee high compression stockings.   Wound Properties Date First Assessed: 08/05/15 Time First Assessed: 1039 Wound Type: Other (Comment) Location: Leg Location Orientation: Right;Medial Wound Description (Comments): multiple small openings that are weeping  Present on Admission: Yes Final Assessment Date: 08/12/15 Final Assessment Time: 1500   Dressing Type Gauze (Comment);Silver hydrofiber   Dressing Changed Changed   Dressing Status None   Dressing Change Frequency PRN   Site / Wound Assessment Dry;Granulation tissue   % Wound base Red or Granulating 100%   % Wound base Yellow 0%   Peri-wound Assessment Edema;Hemosiderin   Drainage Amount Moderate   Drainage Description No odor;Serous   Treatment Cleansed   Wound Properties Date First Assessed: 07/29/15 Time First Assessed: 0916 Wound Type: Puncture Location: Leg Location Orientation: Left;Lower Present on Admission: Yes   Dressing Type Compression wrap;Gauze (Comment);Moist to moist;Impregnated gauze (bismuth)   Dressing Changed Changed   Dressing Status Old drainage   Dressing Change Frequency PRN   Site / Wound Assessment Clean;Dry   % Wound base Red or Granulating 100%   % Wound base Yellow 0%   Peri-wound Assessment Hemosiderin   Drainage Amount Scant   Drainage Description Serous   Treatment Cleansed;Debridement (Selective)  Wound Therapy - Clinical Statement Rt medial thigh with "dripping" drainage at this point, overall improving without induration perimeter as before.  Lt LE inferior knee wound continues to heal with only 3 small spots remaining and scant drainage.  continued with massage this session and education (see education above regarding juxtafit).  Pt came in with compression garments rolled down causing rings and uneven compression.  Instructed to fit hose on heel and not to pull them up so high and will not be too long to roll down.  Pt and spouse verbalized understanding.  Pt can now complete his massage  using his pump and come only for wound care.  Anticipate heeling by end of next week.  Urged to [facility] appt at [facility] for juxtafit.   Wound Therapy - Functional Problem List difficulty walking, edema    Wound Plan Continue with debridement with appropriate dressings.  continue with education.  Decrease treatment time to one slot.   Dressing  silverhydrofiber, 1/4 ABD, medipore tape to Rt medial thigh.  Lt knee wound covered with xeroform  2x2 gauze  followed by dry 2x2                 PT Education - 10/12/15 1647    Education provided Yes   Education Details Educated on proper donning of compression hose.   Demonstrated juxtafit garment with sample at clinic and explained to patient and spouse.    Person(s) Educated Patient;Spouse   Methods Explanation;Demonstration   Comprehension Verbalized understanding          PT Short Term Goals - 09/29/15 1625      PT SHORT TERM GOAL #1   Title Pt to be able to verbalize signs and symptoms of cellulitis as well as the importance to call MD to prevent a systemic infection   Time 1   Period Weeks   Status Achieved     PT SHORT TERM GOAL #2   Title Pt pain to be decreased to no more than a 4/10 to allow pt to be able to walk for five minutes without increased pain    Time 4   Period Weeks   Status Achieved     PT SHORT TERM GOAL #3   Title Wound bed to be 100% granulated to decrease risk of infection and allow closure of wound    Time 4   Period Weeks   Status On-going     PT SHORT TERM GOAL #4   Title drainage to be scant to show decreased local infection and prevent soiling of clothes    Time 4   Period Weeks   Status On-going     PT SHORT TERM GOAL #5   Title Pt to have scant drainage from Rt LE to prevent soiling of clothes and decrease risk of infection    Time 1   Period Weeks   Status On-going           PT Long Term Goals - 09/29/15 1625      PT LONG TERM GOAL #1   Title Pt pain to be no greater than a 1/10  to allow pt to walk for up to 10 minutes without having to take rest breaks and with no increased Lt leg pain    Time 8   Period Weeks   Status Achieved     PT LONG TERM GOAL #2   Title wound size to have decreased by 3x1 cm with no depth to allow pt and  wife to be able to care for wound at home.    Time 8   Period Weeks   Status On-going     PT LONG TERM GOAL #3   Title Pt wound to have no drainage for reduced risk of infection    Time 8   Period Weeks   Status On-going     PT LONG TERM GOAL #4   Title Pt and wife to verbalize confidence in maintaning wound care at home until wound is completely healed.    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 Chronic respiratory failure with hypoxia and hypercapnia ([place]) 12/11/2014  . Obesity hypoventilation syndrome ([place]) 12/11/2014  . Addisons disease ([place]) 12/11/2014  . Noncompliance with CPAP treatment 12/11/2014  . 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 Breath shortness 10/06/2014  . Snoring 07/01/2013  . Diaphoresis 07/01/2013  . Adrenal insufficiency (Addison's disease) ([place]) 07/01/2013  . Hypogonadism male 07/01/2013  . Hypoxemia 07/01/2013  . Lung nodules   . Aortic aneurysm without rupture  ([place])   . AAA (abdominal aortic aneurysm) ([place])   . Hyperlipidemia   . Clotting disorder ([place])   . Arthritis   . Osteoarthritis   . Dyspnea on exertion 05/31/2010    Teena Irani, PTA/CLT [phone]  10/12/2015, 5:06 PM  [facility] [address] [place], [place], [address] Phone: [phone]   Fax:  [phone]  Name: [name] MRN: [id number] Date of Birth: 07/04/1940

## 2015-10-12 NOTE — Telephone Encounter (Signed)
Pt cx - he said that he just couldn't get here at 10:30 but did want a call if we had anyone that cancelled today

## 2015-10-15 ENCOUNTER — Ambulatory Visit (HOSPITAL_COMMUNITY): Payer: Medicare Other | Admitting: Physical Therapy

## 2015-10-15 DIAGNOSIS — E860 Dehydration: Secondary | ICD-10-CM | POA: Diagnosis not present

## 2015-10-15 DIAGNOSIS — I890 Lymphedema, not elsewhere classified: Secondary | ICD-10-CM

## 2015-10-15 DIAGNOSIS — R262 Difficulty in walking, not elsewhere classified: Secondary | ICD-10-CM

## 2015-10-15 DIAGNOSIS — J9690 Respiratory failure, unspecified, unspecified whether with hypoxia or hypercapnia: Secondary | ICD-10-CM | POA: Diagnosis not present

## 2015-10-15 DIAGNOSIS — M25562 Pain in left knee: Secondary | ICD-10-CM

## 2015-10-15 DIAGNOSIS — L97829 Non-pressure chronic ulcer of other part of left lower leg with unspecified severity: Secondary | ICD-10-CM

## 2015-10-15 DIAGNOSIS — J449 Chronic obstructive pulmonary disease, unspecified: Secondary | ICD-10-CM | POA: Diagnosis not present

## 2015-10-15 DIAGNOSIS — L97219 Non-pressure chronic ulcer of right calf with unspecified severity: Secondary | ICD-10-CM

## 2015-10-15 DIAGNOSIS — I83028 Varicose veins of left lower extremity with ulcer other part of lower leg: Secondary | ICD-10-CM

## 2015-10-15 DIAGNOSIS — I83012 Varicose veins of right lower extremity with ulcer of calf: Secondary | ICD-10-CM

## 2015-10-15 DIAGNOSIS — N10 Acute tubulo-interstitial nephritis: Secondary | ICD-10-CM | POA: Diagnosis not present

## 2015-10-15 DIAGNOSIS — S81022A Laceration with foreign body, left knee, initial encounter: Secondary | ICD-10-CM | POA: Diagnosis not present

## 2015-10-15 DIAGNOSIS — Z7901 Long term (current) use of anticoagulants: Secondary | ICD-10-CM | POA: Diagnosis not present

## 2015-10-15 NOTE — Therapy (Signed)
[place], [place], [address] Phone: [phone]   Fax:  [phone]  Wound Care Therapy  Patient Details  Name: [name] MRN: [id number] Date of Birth: 1940-06-13 No Data Recorded  Encounter Date: 10/15/2015  Treatment 30/30    Past Medical History:  Diagnosis Date  . AAA (abdominal aortic aneurysm) ([place])    followed by Dr. Sherren Mocha Early  . Aortic aneurysm without rupture ([place])   . Arthritis   . Cholecystitis   . Clotting disorder ([place])    history of DVT  . Diabetes mellitus    type II  . Hyperlipidemia   . Hypertension   . Lung nodules   . Obesity   . Osteoarthritis   . Venous insufficiency    lower extremities    Past Surgical History:  Procedure Laterality Date  . CATARACT EXTRACTION W/PHACO  10/21/2010   Procedure: CATARACT EXTRACTION PHACO AND INTRAOCULAR LENS PLACEMENT (IOC);  Surgeon: Tonny Branch;  Location: [facility];  Service: Ophthalmology;  Laterality: Left;  CDE:16.80  . CHOLECYSTECTOMY    . INGUINAL HERNIA REPAIR    . TESTICLE SURGERY  2004   Dr. [name]  . YAG LASER APPLICATION Right 03/19/6331   Procedure: YAG LASER APPLICATION;  Surgeon: [name], MD;  Location: [facility];  Service: Ophthalmology;  Laterality: Right;    There were no vitals filed for this visit.                  Wound Therapy - 10/15/15 1557    Subjective Wife concerned with continued weakness and wants patient to resume HHPT asap.  Pt comes today without compression on LE's.  No pain or weeping noted.   Pain Assessment No/denies pain   Wound Properties Date First Assessed: 08/05/15 Time First Assessed: 1039 Wound Type: Other (Comment) Location: Leg Location Orientation: Right;Medial Wound Description (Comments): multiple small openings that are weeping  Present on Admission: Yes Final Assessment Date: 08/12/15 Final Assessment Time: 1500   Dressing Type Gauze (Comment);Silver hydrofiber   Dressing  Changed Changed   Dressing Status Old drainage   Dressing Change Frequency PRN   Site / Wound Assessment Dry;Granulation tissue   % Wound base Red or Granulating 100%   % Wound base Yellow 0%   Peri-wound Assessment Edema;Hemosiderin   Drainage Amount Minimal   Drainage Description No odor;Serous   Treatment Cleansed   Wound Properties Date First Assessed: 07/29/15 Time First Assessed: 0916 Wound Type: Puncture Location: Leg Location Orientation: Left;Lower Present on Admission: Yes   Dressing Type Compression wrap;Gauze (Comment);Moist to moist;Impregnated gauze (bismuth)   Dressing Changed Changed   Dressing Status Old drainage   Dressing Change Frequency PRN   Site / Wound Assessment Clean;Dry   % Wound base Red or Granulating 100%   % Wound base Yellow 0%   Peri-wound Assessment Hemosiderin   Drainage Amount Scant   Drainage Description Serous   Treatment Cleansed;Debridement (Select[place])   Wound Therapy - Clinical Statement Original area on Rt medial thigh is now healed but with small area distal to original that is draining a little serous fluid.  Lt anterior shin wound only with one small opening approx 0.2X0.2 cm.  Spouse and patient instructed to call [facility] and make appt for juxtafit compression fitting ASAP and to continue with light dressings on both areas until fully healed.  Pt has met all skilled needs at this time and is ready for discharge.    Wound  Plan Discharge                   PT Short Term Goals - 10/15/15 1603      PT SHORT TERM GOAL #1   Title Pt to be able to verbalize signs and symptoms of cellulitis as well as the importance to call MD to prevent a systemic infection   Time 1   Period Weeks   Status Achieved     PT SHORT TERM GOAL #2   Title Pt pain to be decreased to no more than a 4/10 to allow pt to be able to walk for five minutes without increased pain    Time 4   Period Weeks   Status Achieved     PT SHORT TERM GOAL #3   Title  Wound bed to be 100% granulated to decrease risk of infection and allow closure of wound    Time 4   Period Weeks   Status Achieved     PT SHORT TERM GOAL #4   Title drainage to be scant to show decreased local infection and prevent soiling of clothes    Time 4   Period Weeks   Status On-going     PT SHORT TERM GOAL #5   Title Pt to have scant drainage from Rt LE to prevent soiling of clothes and decrease risk of infection    Time 1   Period Weeks   Status On-going           PT Long Term Goals - 10/15/15 1603      PT LONG TERM GOAL #1   Title Pt pain to be no greater than a 1/10 to allow pt to walk for up to 10 minutes without having to take rest breaks and with no increased Lt leg pain    Time 8   Period Weeks   Status Partially Met  unable to walk 10 minutes due to fatigue     PT LONG TERM GOAL #2   Title wound size to have decreased by 3x1 cm with no depth to allow pt and wife to be able to care for wound at home.    Time 8   Period Weeks   Status Achieved     PT LONG TERM GOAL #3   Title Pt wound to have no drainage for reduced risk of infection    Time 8   Period Weeks   Status Partially Met     PT LONG TERM GOAL #4   Title Pt and wife to verbalize confidence in maintaning wound care at home until wound is completely healed.    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 Chronic respiratory failure with hypoxia and hypercapnia ([place]) 12/11/2014  . Obesity hypoventilation syndrome ([place]) 12/11/2014  . Addisons disease ([place]) 12/11/2014  . Noncompliance with CPAP treatment 12/11/2014  . 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 Breath shortness 10/06/2014  . Snoring 07/01/2013  . Diaphoresis 07/01/2013  . Adrenal insufficiency (Addison's disease) ([place]) 07/01/2013  . Hypogonadism male 07/01/2013  . Hypoxemia 07/01/2013  . Lung nodules   . Aortic aneurysm without rupture ([place])   . AAA (abdominal aortic aneurysm) ([place])   . Hyperlipidemia   . Clotting disorder ([place])   . Arthritis   . Osteoarthritis   . Dyspnea on exertion 05/31/2010    Teena Irani, PTA/CLT [phone] 10/15/2015, 4:27 PM  [facility] [address] [place], [place], [address] Phone: [phone]   Fax:  [phone]  Name: [name] MRN: [id number] Date of Birth: November 24, 1940  PHYSICAL THERAPY DISCHARGE SUMMARY  Visits from Start of Care:30  Current functional level related to goals / functional outcomes: Wound is almost healed. Pt is very weak and would benefit from Home health physical therapy at this time.  Pt has recently been discharged from the hospital with diagnosis of UTI And pnumonia.    Remaining deficits: Decreased activity tolerance due to UTI and pnuemonia    Education / Equipment: Cleanse  wound and keep covered until totally healed   Plan: Patient agrees to discharge.  Patient goals were partially met.                                                  the patient's request.  ?????    Rayetta Humphrey, [place] CLT [phone]

## 2015-10-17 DIAGNOSIS — I872 Venous insufficiency (chronic) (peripheral): Secondary | ICD-10-CM | POA: Diagnosis not present

## 2015-10-17 DIAGNOSIS — G894 Chronic pain syndrome: Secondary | ICD-10-CM | POA: Diagnosis not present

## 2015-10-17 DIAGNOSIS — J440 Chronic obstructive pulmonary disease with acute lower respiratory infection: Secondary | ICD-10-CM | POA: Diagnosis not present

## 2015-10-17 DIAGNOSIS — J441 Chronic obstructive pulmonary disease with (acute) exacerbation: Secondary | ICD-10-CM | POA: Diagnosis not present

## 2015-10-17 DIAGNOSIS — M4800 Spinal stenosis, site unspecified: Secondary | ICD-10-CM | POA: Diagnosis not present

## 2015-10-17 DIAGNOSIS — I10 Essential (primary) hypertension: Secondary | ICD-10-CM | POA: Diagnosis not present

## 2015-10-17 DIAGNOSIS — S81802D Unspecified open wound, left lower leg, subsequent encounter: Secondary | ICD-10-CM | POA: Diagnosis not present

## 2015-10-17 DIAGNOSIS — M1991 Primary osteoarthritis, unspecified site: Secondary | ICD-10-CM | POA: Diagnosis not present

## 2015-10-17 DIAGNOSIS — I480 Paroxysmal atrial fibrillation: Secondary | ICD-10-CM | POA: Diagnosis not present

## 2015-10-17 DIAGNOSIS — J189 Pneumonia, unspecified organism: Secondary | ICD-10-CM | POA: Diagnosis not present

## 2015-10-20 ENCOUNTER — Ambulatory Visit (HOSPITAL_COMMUNITY): Payer: Medicare Other | Admitting: Physical Therapy

## 2015-10-20 DIAGNOSIS — J9611 Chronic respiratory failure with hypoxia: Secondary | ICD-10-CM | POA: Diagnosis not present

## 2015-10-20 DIAGNOSIS — E2749 Other adrenocortical insufficiency: Secondary | ICD-10-CM | POA: Diagnosis not present

## 2015-10-20 DIAGNOSIS — J441 Chronic obstructive pulmonary disease with (acute) exacerbation: Secondary | ICD-10-CM | POA: Diagnosis not present

## 2015-10-21 DIAGNOSIS — J189 Pneumonia, unspecified organism: Secondary | ICD-10-CM | POA: Diagnosis not present

## 2015-10-21 DIAGNOSIS — M1991 Primary osteoarthritis, unspecified site: Secondary | ICD-10-CM | POA: Diagnosis not present

## 2015-10-21 DIAGNOSIS — I480 Paroxysmal atrial fibrillation: Secondary | ICD-10-CM | POA: Diagnosis not present

## 2015-10-21 DIAGNOSIS — I872 Venous insufficiency (chronic) (peripheral): Secondary | ICD-10-CM | POA: Diagnosis not present

## 2015-10-21 DIAGNOSIS — I10 Essential (primary) hypertension: Secondary | ICD-10-CM | POA: Diagnosis not present

## 2015-10-21 DIAGNOSIS — G894 Chronic pain syndrome: Secondary | ICD-10-CM | POA: Diagnosis not present

## 2015-10-21 DIAGNOSIS — M4800 Spinal stenosis, site unspecified: Secondary | ICD-10-CM | POA: Diagnosis not present

## 2015-10-21 DIAGNOSIS — S81802D Unspecified open wound, left lower leg, subsequent encounter: Secondary | ICD-10-CM | POA: Diagnosis not present

## 2015-10-21 DIAGNOSIS — J441 Chronic obstructive pulmonary disease with (acute) exacerbation: Secondary | ICD-10-CM | POA: Diagnosis not present

## 2015-10-21 DIAGNOSIS — J440 Chronic obstructive pulmonary disease with acute lower respiratory infection: Secondary | ICD-10-CM | POA: Diagnosis not present

## 2015-10-22 ENCOUNTER — Ambulatory Visit (HOSPITAL_COMMUNITY): Payer: Medicare Other | Admitting: Physical Therapy

## 2015-10-23 DIAGNOSIS — I10 Essential (primary) hypertension: Secondary | ICD-10-CM | POA: Diagnosis not present

## 2015-10-23 DIAGNOSIS — S81802D Unspecified open wound, left lower leg, subsequent encounter: Secondary | ICD-10-CM | POA: Diagnosis not present

## 2015-10-23 DIAGNOSIS — I480 Paroxysmal atrial fibrillation: Secondary | ICD-10-CM | POA: Diagnosis not present

## 2015-10-23 DIAGNOSIS — M1991 Primary osteoarthritis, unspecified site: Secondary | ICD-10-CM | POA: Diagnosis not present

## 2015-10-23 DIAGNOSIS — J441 Chronic obstructive pulmonary disease with (acute) exacerbation: Secondary | ICD-10-CM | POA: Diagnosis not present

## 2015-10-23 DIAGNOSIS — J440 Chronic obstructive pulmonary disease with acute lower respiratory infection: Secondary | ICD-10-CM | POA: Diagnosis not present

## 2015-10-23 DIAGNOSIS — J189 Pneumonia, unspecified organism: Secondary | ICD-10-CM | POA: Diagnosis not present

## 2015-10-23 DIAGNOSIS — I872 Venous insufficiency (chronic) (peripheral): Secondary | ICD-10-CM | POA: Diagnosis not present

## 2015-10-23 DIAGNOSIS — G894 Chronic pain syndrome: Secondary | ICD-10-CM | POA: Diagnosis not present

## 2015-10-23 DIAGNOSIS — M4800 Spinal stenosis, site unspecified: Secondary | ICD-10-CM | POA: Diagnosis not present

## 2015-10-26 ENCOUNTER — Ambulatory Visit (HOSPITAL_COMMUNITY): Payer: Medicare Other | Admitting: Physical Therapy

## 2015-10-26 DIAGNOSIS — S81802D Unspecified open wound, left lower leg, subsequent encounter: Secondary | ICD-10-CM | POA: Diagnosis not present

## 2015-10-26 DIAGNOSIS — I10 Essential (primary) hypertension: Secondary | ICD-10-CM | POA: Diagnosis not present

## 2015-10-26 DIAGNOSIS — J440 Chronic obstructive pulmonary disease with acute lower respiratory infection: Secondary | ICD-10-CM | POA: Diagnosis not present

## 2015-10-26 DIAGNOSIS — M1991 Primary osteoarthritis, unspecified site: Secondary | ICD-10-CM | POA: Diagnosis not present

## 2015-10-26 DIAGNOSIS — J441 Chronic obstructive pulmonary disease with (acute) exacerbation: Secondary | ICD-10-CM | POA: Diagnosis not present

## 2015-10-26 DIAGNOSIS — I872 Venous insufficiency (chronic) (peripheral): Secondary | ICD-10-CM | POA: Diagnosis not present

## 2015-10-26 DIAGNOSIS — I480 Paroxysmal atrial fibrillation: Secondary | ICD-10-CM | POA: Diagnosis not present

## 2015-10-26 DIAGNOSIS — J189 Pneumonia, unspecified organism: Secondary | ICD-10-CM | POA: Diagnosis not present

## 2015-10-26 DIAGNOSIS — G894 Chronic pain syndrome: Secondary | ICD-10-CM | POA: Diagnosis not present

## 2015-10-26 DIAGNOSIS — M4800 Spinal stenosis, site unspecified: Secondary | ICD-10-CM | POA: Diagnosis not present

## 2015-10-27 DIAGNOSIS — M4800 Spinal stenosis, site unspecified: Secondary | ICD-10-CM | POA: Diagnosis not present

## 2015-10-27 DIAGNOSIS — M1991 Primary osteoarthritis, unspecified site: Secondary | ICD-10-CM | POA: Diagnosis not present

## 2015-10-27 DIAGNOSIS — J441 Chronic obstructive pulmonary disease with (acute) exacerbation: Secondary | ICD-10-CM | POA: Diagnosis not present

## 2015-10-27 DIAGNOSIS — J440 Chronic obstructive pulmonary disease with acute lower respiratory infection: Secondary | ICD-10-CM | POA: Diagnosis not present

## 2015-10-27 DIAGNOSIS — I480 Paroxysmal atrial fibrillation: Secondary | ICD-10-CM | POA: Diagnosis not present

## 2015-10-27 DIAGNOSIS — I872 Venous insufficiency (chronic) (peripheral): Secondary | ICD-10-CM | POA: Diagnosis not present

## 2015-10-27 DIAGNOSIS — S81802D Unspecified open wound, left lower leg, subsequent encounter: Secondary | ICD-10-CM | POA: Diagnosis not present

## 2015-10-27 DIAGNOSIS — G894 Chronic pain syndrome: Secondary | ICD-10-CM | POA: Diagnosis not present

## 2015-10-27 DIAGNOSIS — I10 Essential (primary) hypertension: Secondary | ICD-10-CM | POA: Diagnosis not present

## 2015-10-27 DIAGNOSIS — J189 Pneumonia, unspecified organism: Secondary | ICD-10-CM | POA: Diagnosis not present

## 2015-10-28 DIAGNOSIS — G4730 Sleep apnea, unspecified: Secondary | ICD-10-CM | POA: Diagnosis not present

## 2015-10-28 DIAGNOSIS — J449 Chronic obstructive pulmonary disease, unspecified: Secondary | ICD-10-CM | POA: Diagnosis not present

## 2015-10-29 ENCOUNTER — Ambulatory Visit (HOSPITAL_COMMUNITY): Payer: Medicare Other | Admitting: Physical Therapy

## 2015-10-29 DIAGNOSIS — I480 Paroxysmal atrial fibrillation: Secondary | ICD-10-CM | POA: Diagnosis not present

## 2015-10-29 DIAGNOSIS — S81802D Unspecified open wound, left lower leg, subsequent encounter: Secondary | ICD-10-CM | POA: Diagnosis not present

## 2015-10-29 DIAGNOSIS — I872 Venous insufficiency (chronic) (peripheral): Secondary | ICD-10-CM | POA: Diagnosis not present

## 2015-10-29 DIAGNOSIS — J189 Pneumonia, unspecified organism: Secondary | ICD-10-CM | POA: Diagnosis not present

## 2015-10-29 DIAGNOSIS — M1991 Primary osteoarthritis, unspecified site: Secondary | ICD-10-CM | POA: Diagnosis not present

## 2015-10-29 DIAGNOSIS — I10 Essential (primary) hypertension: Secondary | ICD-10-CM | POA: Diagnosis not present

## 2015-10-29 DIAGNOSIS — J441 Chronic obstructive pulmonary disease with (acute) exacerbation: Secondary | ICD-10-CM | POA: Diagnosis not present

## 2015-10-29 DIAGNOSIS — M4800 Spinal stenosis, site unspecified: Secondary | ICD-10-CM | POA: Diagnosis not present

## 2015-10-29 DIAGNOSIS — G894 Chronic pain syndrome: Secondary | ICD-10-CM | POA: Diagnosis not present

## 2015-10-29 DIAGNOSIS — J440 Chronic obstructive pulmonary disease with acute lower respiratory infection: Secondary | ICD-10-CM | POA: Diagnosis not present

## 2015-10-30 DIAGNOSIS — J441 Chronic obstructive pulmonary disease with (acute) exacerbation: Secondary | ICD-10-CM | POA: Diagnosis not present

## 2015-10-30 DIAGNOSIS — I10 Essential (primary) hypertension: Secondary | ICD-10-CM | POA: Diagnosis not present

## 2015-10-30 DIAGNOSIS — S81802D Unspecified open wound, left lower leg, subsequent encounter: Secondary | ICD-10-CM | POA: Diagnosis not present

## 2015-10-30 DIAGNOSIS — I480 Paroxysmal atrial fibrillation: Secondary | ICD-10-CM | POA: Diagnosis not present

## 2015-10-30 DIAGNOSIS — G894 Chronic pain syndrome: Secondary | ICD-10-CM | POA: Diagnosis not present

## 2015-10-30 DIAGNOSIS — J189 Pneumonia, unspecified organism: Secondary | ICD-10-CM | POA: Diagnosis not present

## 2015-10-30 DIAGNOSIS — I872 Venous insufficiency (chronic) (peripheral): Secondary | ICD-10-CM | POA: Diagnosis not present

## 2015-10-30 DIAGNOSIS — J440 Chronic obstructive pulmonary disease with acute lower respiratory infection: Secondary | ICD-10-CM | POA: Diagnosis not present

## 2015-10-30 DIAGNOSIS — M1991 Primary osteoarthritis, unspecified site: Secondary | ICD-10-CM | POA: Diagnosis not present

## 2015-10-30 DIAGNOSIS — M4800 Spinal stenosis, site unspecified: Secondary | ICD-10-CM | POA: Diagnosis not present

## 2015-11-02 ENCOUNTER — Ambulatory Visit (HOSPITAL_COMMUNITY): Payer: Medicare Other | Admitting: Physical Therapy

## 2015-11-03 DIAGNOSIS — I10 Essential (primary) hypertension: Secondary | ICD-10-CM | POA: Diagnosis not present

## 2015-11-03 DIAGNOSIS — J441 Chronic obstructive pulmonary disease with (acute) exacerbation: Secondary | ICD-10-CM | POA: Diagnosis not present

## 2015-11-03 DIAGNOSIS — J189 Pneumonia, unspecified organism: Secondary | ICD-10-CM | POA: Diagnosis not present

## 2015-11-03 DIAGNOSIS — I872 Venous insufficiency (chronic) (peripheral): Secondary | ICD-10-CM | POA: Diagnosis not present

## 2015-11-03 DIAGNOSIS — M4800 Spinal stenosis, site unspecified: Secondary | ICD-10-CM | POA: Diagnosis not present

## 2015-11-03 DIAGNOSIS — J440 Chronic obstructive pulmonary disease with acute lower respiratory infection: Secondary | ICD-10-CM | POA: Diagnosis not present

## 2015-11-03 DIAGNOSIS — G894 Chronic pain syndrome: Secondary | ICD-10-CM | POA: Diagnosis not present

## 2015-11-03 DIAGNOSIS — I480 Paroxysmal atrial fibrillation: Secondary | ICD-10-CM | POA: Diagnosis not present

## 2015-11-03 DIAGNOSIS — S81802D Unspecified open wound, left lower leg, subsequent encounter: Secondary | ICD-10-CM | POA: Diagnosis not present

## 2015-11-03 DIAGNOSIS — M1991 Primary osteoarthritis, unspecified site: Secondary | ICD-10-CM | POA: Diagnosis not present

## 2015-11-04 DIAGNOSIS — G894 Chronic pain syndrome: Secondary | ICD-10-CM | POA: Diagnosis not present

## 2015-11-04 DIAGNOSIS — I480 Paroxysmal atrial fibrillation: Secondary | ICD-10-CM | POA: Diagnosis not present

## 2015-11-04 DIAGNOSIS — J189 Pneumonia, unspecified organism: Secondary | ICD-10-CM | POA: Diagnosis not present

## 2015-11-04 DIAGNOSIS — M1991 Primary osteoarthritis, unspecified site: Secondary | ICD-10-CM | POA: Diagnosis not present

## 2015-11-04 DIAGNOSIS — I10 Essential (primary) hypertension: Secondary | ICD-10-CM | POA: Diagnosis not present

## 2015-11-04 DIAGNOSIS — M4800 Spinal stenosis, site unspecified: Secondary | ICD-10-CM | POA: Diagnosis not present

## 2015-11-04 DIAGNOSIS — J440 Chronic obstructive pulmonary disease with acute lower respiratory infection: Secondary | ICD-10-CM | POA: Diagnosis not present

## 2015-11-04 DIAGNOSIS — S81802D Unspecified open wound, left lower leg, subsequent encounter: Secondary | ICD-10-CM | POA: Diagnosis not present

## 2015-11-04 DIAGNOSIS — I872 Venous insufficiency (chronic) (peripheral): Secondary | ICD-10-CM | POA: Diagnosis not present

## 2015-11-04 DIAGNOSIS — J441 Chronic obstructive pulmonary disease with (acute) exacerbation: Secondary | ICD-10-CM | POA: Diagnosis not present

## 2015-11-05 ENCOUNTER — Ambulatory Visit (HOSPITAL_COMMUNITY): Payer: Medicare Other | Admitting: Physical Therapy

## 2015-11-05 ENCOUNTER — Other Ambulatory Visit: Payer: Self-pay | Admitting: *Deleted

## 2015-11-05 DIAGNOSIS — I712 Thoracic aortic aneurysm, without rupture, unspecified: Secondary | ICD-10-CM

## 2015-11-06 ENCOUNTER — Other Ambulatory Visit: Payer: Self-pay | Admitting: *Deleted

## 2015-11-06 DIAGNOSIS — I872 Venous insufficiency (chronic) (peripheral): Secondary | ICD-10-CM | POA: Diagnosis not present

## 2015-11-06 DIAGNOSIS — J189 Pneumonia, unspecified organism: Secondary | ICD-10-CM | POA: Diagnosis not present

## 2015-11-06 DIAGNOSIS — J441 Chronic obstructive pulmonary disease with (acute) exacerbation: Secondary | ICD-10-CM | POA: Diagnosis not present

## 2015-11-06 DIAGNOSIS — G894 Chronic pain syndrome: Secondary | ICD-10-CM | POA: Diagnosis not present

## 2015-11-06 DIAGNOSIS — J440 Chronic obstructive pulmonary disease with acute lower respiratory infection: Secondary | ICD-10-CM | POA: Diagnosis not present

## 2015-11-06 DIAGNOSIS — I480 Paroxysmal atrial fibrillation: Secondary | ICD-10-CM | POA: Diagnosis not present

## 2015-11-06 DIAGNOSIS — M1991 Primary osteoarthritis, unspecified site: Secondary | ICD-10-CM | POA: Diagnosis not present

## 2015-11-06 DIAGNOSIS — S81802D Unspecified open wound, left lower leg, subsequent encounter: Secondary | ICD-10-CM | POA: Diagnosis not present

## 2015-11-06 DIAGNOSIS — M4800 Spinal stenosis, site unspecified: Secondary | ICD-10-CM | POA: Diagnosis not present

## 2015-11-06 DIAGNOSIS — I10 Essential (primary) hypertension: Secondary | ICD-10-CM | POA: Diagnosis not present

## 2015-11-09 ENCOUNTER — Ambulatory Visit (HOSPITAL_COMMUNITY): Payer: Medicare Other | Admitting: Physical Therapy

## 2015-11-09 DIAGNOSIS — G894 Chronic pain syndrome: Secondary | ICD-10-CM | POA: Diagnosis not present

## 2015-11-09 DIAGNOSIS — J441 Chronic obstructive pulmonary disease with (acute) exacerbation: Secondary | ICD-10-CM | POA: Diagnosis not present

## 2015-11-09 DIAGNOSIS — I480 Paroxysmal atrial fibrillation: Secondary | ICD-10-CM | POA: Diagnosis not present

## 2015-11-09 DIAGNOSIS — S81802D Unspecified open wound, left lower leg, subsequent encounter: Secondary | ICD-10-CM | POA: Diagnosis not present

## 2015-11-09 DIAGNOSIS — M1991 Primary osteoarthritis, unspecified site: Secondary | ICD-10-CM | POA: Diagnosis not present

## 2015-11-09 DIAGNOSIS — J189 Pneumonia, unspecified organism: Secondary | ICD-10-CM | POA: Diagnosis not present

## 2015-11-09 DIAGNOSIS — I10 Essential (primary) hypertension: Secondary | ICD-10-CM | POA: Diagnosis not present

## 2015-11-09 DIAGNOSIS — J440 Chronic obstructive pulmonary disease with acute lower respiratory infection: Secondary | ICD-10-CM | POA: Diagnosis not present

## 2015-11-09 DIAGNOSIS — M4800 Spinal stenosis, site unspecified: Secondary | ICD-10-CM | POA: Diagnosis not present

## 2015-11-09 DIAGNOSIS — I872 Venous insufficiency (chronic) (peripheral): Secondary | ICD-10-CM | POA: Diagnosis not present

## 2015-11-10 DIAGNOSIS — M1991 Primary osteoarthritis, unspecified site: Secondary | ICD-10-CM | POA: Diagnosis not present

## 2015-11-10 DIAGNOSIS — I480 Paroxysmal atrial fibrillation: Secondary | ICD-10-CM | POA: Diagnosis not present

## 2015-11-10 DIAGNOSIS — S81802D Unspecified open wound, left lower leg, subsequent encounter: Secondary | ICD-10-CM | POA: Diagnosis not present

## 2015-11-10 DIAGNOSIS — G894 Chronic pain syndrome: Secondary | ICD-10-CM | POA: Diagnosis not present

## 2015-11-10 DIAGNOSIS — M4800 Spinal stenosis, site unspecified: Secondary | ICD-10-CM | POA: Diagnosis not present

## 2015-11-10 DIAGNOSIS — J441 Chronic obstructive pulmonary disease with (acute) exacerbation: Secondary | ICD-10-CM | POA: Diagnosis not present

## 2015-11-10 DIAGNOSIS — I10 Essential (primary) hypertension: Secondary | ICD-10-CM | POA: Diagnosis not present

## 2015-11-10 DIAGNOSIS — I872 Venous insufficiency (chronic) (peripheral): Secondary | ICD-10-CM | POA: Diagnosis not present

## 2015-11-10 DIAGNOSIS — J440 Chronic obstructive pulmonary disease with acute lower respiratory infection: Secondary | ICD-10-CM | POA: Diagnosis not present

## 2015-11-10 DIAGNOSIS — J189 Pneumonia, unspecified organism: Secondary | ICD-10-CM | POA: Diagnosis not present

## 2015-11-12 ENCOUNTER — Ambulatory Visit (HOSPITAL_COMMUNITY): Payer: Medicare Other | Admitting: Physical Therapy

## 2015-11-12 DIAGNOSIS — M1991 Primary osteoarthritis, unspecified site: Secondary | ICD-10-CM | POA: Diagnosis not present

## 2015-11-12 DIAGNOSIS — I480 Paroxysmal atrial fibrillation: Secondary | ICD-10-CM | POA: Diagnosis not present

## 2015-11-12 DIAGNOSIS — J441 Chronic obstructive pulmonary disease with (acute) exacerbation: Secondary | ICD-10-CM | POA: Diagnosis not present

## 2015-11-12 DIAGNOSIS — M4800 Spinal stenosis, site unspecified: Secondary | ICD-10-CM | POA: Diagnosis not present

## 2015-11-12 DIAGNOSIS — J189 Pneumonia, unspecified organism: Secondary | ICD-10-CM | POA: Diagnosis not present

## 2015-11-12 DIAGNOSIS — G894 Chronic pain syndrome: Secondary | ICD-10-CM | POA: Diagnosis not present

## 2015-11-12 DIAGNOSIS — I872 Venous insufficiency (chronic) (peripheral): Secondary | ICD-10-CM | POA: Diagnosis not present

## 2015-11-12 DIAGNOSIS — S81802D Unspecified open wound, left lower leg, subsequent encounter: Secondary | ICD-10-CM | POA: Diagnosis not present

## 2015-11-12 DIAGNOSIS — I10 Essential (primary) hypertension: Secondary | ICD-10-CM | POA: Diagnosis not present

## 2015-11-12 DIAGNOSIS — J440 Chronic obstructive pulmonary disease with acute lower respiratory infection: Secondary | ICD-10-CM | POA: Diagnosis not present

## 2015-11-16 DIAGNOSIS — J440 Chronic obstructive pulmonary disease with acute lower respiratory infection: Secondary | ICD-10-CM | POA: Diagnosis not present

## 2015-11-16 DIAGNOSIS — M4800 Spinal stenosis, site unspecified: Secondary | ICD-10-CM | POA: Diagnosis not present

## 2015-11-16 DIAGNOSIS — I10 Essential (primary) hypertension: Secondary | ICD-10-CM | POA: Diagnosis not present

## 2015-11-16 DIAGNOSIS — J189 Pneumonia, unspecified organism: Secondary | ICD-10-CM | POA: Diagnosis not present

## 2015-11-16 DIAGNOSIS — G894 Chronic pain syndrome: Secondary | ICD-10-CM | POA: Diagnosis not present

## 2015-11-16 DIAGNOSIS — M1991 Primary osteoarthritis, unspecified site: Secondary | ICD-10-CM | POA: Diagnosis not present

## 2015-11-16 DIAGNOSIS — S81802D Unspecified open wound, left lower leg, subsequent encounter: Secondary | ICD-10-CM | POA: Diagnosis not present

## 2015-11-16 DIAGNOSIS — J441 Chronic obstructive pulmonary disease with (acute) exacerbation: Secondary | ICD-10-CM | POA: Diagnosis not present

## 2015-11-16 DIAGNOSIS — I872 Venous insufficiency (chronic) (peripheral): Secondary | ICD-10-CM | POA: Diagnosis not present

## 2015-11-16 DIAGNOSIS — I480 Paroxysmal atrial fibrillation: Secondary | ICD-10-CM | POA: Diagnosis not present

## 2015-11-18 ENCOUNTER — Telehealth (HOSPITAL_COMMUNITY): Payer: Self-pay | Admitting: Physical Therapy

## 2015-11-18 DIAGNOSIS — M1991 Primary osteoarthritis, unspecified site: Secondary | ICD-10-CM | POA: Diagnosis not present

## 2015-11-18 DIAGNOSIS — I480 Paroxysmal atrial fibrillation: Secondary | ICD-10-CM | POA: Diagnosis not present

## 2015-11-18 DIAGNOSIS — J189 Pneumonia, unspecified organism: Secondary | ICD-10-CM | POA: Diagnosis not present

## 2015-11-18 DIAGNOSIS — I10 Essential (primary) hypertension: Secondary | ICD-10-CM | POA: Diagnosis not present

## 2015-11-18 DIAGNOSIS — I872 Venous insufficiency (chronic) (peripheral): Secondary | ICD-10-CM | POA: Diagnosis not present

## 2015-11-18 DIAGNOSIS — J441 Chronic obstructive pulmonary disease with (acute) exacerbation: Secondary | ICD-10-CM | POA: Diagnosis not present

## 2015-11-18 DIAGNOSIS — S81802D Unspecified open wound, left lower leg, subsequent encounter: Secondary | ICD-10-CM | POA: Diagnosis not present

## 2015-11-18 DIAGNOSIS — G894 Chronic pain syndrome: Secondary | ICD-10-CM | POA: Diagnosis not present

## 2015-11-18 DIAGNOSIS — J440 Chronic obstructive pulmonary disease with acute lower respiratory infection: Secondary | ICD-10-CM | POA: Diagnosis not present

## 2015-11-18 DIAGNOSIS — M4800 Spinal stenosis, site unspecified: Secondary | ICD-10-CM | POA: Diagnosis not present

## 2015-11-18 NOTE — Telephone Encounter (Signed)
Printed order on 11/12/15 for pt to pick up. L/M ask pt to call us back and let us know when they would pick up the grament order to take to MD to sign. 11/18/15

## 2015-11-19 DIAGNOSIS — M4800 Spinal stenosis, site unspecified: Secondary | ICD-10-CM | POA: Diagnosis not present

## 2015-11-19 DIAGNOSIS — G894 Chronic pain syndrome: Secondary | ICD-10-CM | POA: Diagnosis not present

## 2015-11-19 DIAGNOSIS — I872 Venous insufficiency (chronic) (peripheral): Secondary | ICD-10-CM | POA: Diagnosis not present

## 2015-11-19 DIAGNOSIS — S81802D Unspecified open wound, left lower leg, subsequent encounter: Secondary | ICD-10-CM | POA: Diagnosis not present

## 2015-11-19 DIAGNOSIS — J189 Pneumonia, unspecified organism: Secondary | ICD-10-CM | POA: Diagnosis not present

## 2015-11-19 DIAGNOSIS — J440 Chronic obstructive pulmonary disease with acute lower respiratory infection: Secondary | ICD-10-CM | POA: Diagnosis not present

## 2015-11-19 DIAGNOSIS — I480 Paroxysmal atrial fibrillation: Secondary | ICD-10-CM | POA: Diagnosis not present

## 2015-11-19 DIAGNOSIS — I10 Essential (primary) hypertension: Secondary | ICD-10-CM | POA: Diagnosis not present

## 2015-11-19 DIAGNOSIS — M1991 Primary osteoarthritis, unspecified site: Secondary | ICD-10-CM | POA: Diagnosis not present

## 2015-11-19 DIAGNOSIS — J441 Chronic obstructive pulmonary disease with (acute) exacerbation: Secondary | ICD-10-CM | POA: Diagnosis not present

## 2015-11-24 DIAGNOSIS — I872 Venous insufficiency (chronic) (peripheral): Secondary | ICD-10-CM | POA: Diagnosis not present

## 2015-11-24 DIAGNOSIS — J440 Chronic obstructive pulmonary disease with acute lower respiratory infection: Secondary | ICD-10-CM | POA: Diagnosis not present

## 2015-11-24 DIAGNOSIS — I480 Paroxysmal atrial fibrillation: Secondary | ICD-10-CM | POA: Diagnosis not present

## 2015-11-24 DIAGNOSIS — S81802D Unspecified open wound, left lower leg, subsequent encounter: Secondary | ICD-10-CM | POA: Diagnosis not present

## 2015-11-24 DIAGNOSIS — J441 Chronic obstructive pulmonary disease with (acute) exacerbation: Secondary | ICD-10-CM | POA: Diagnosis not present

## 2015-11-24 DIAGNOSIS — M4800 Spinal stenosis, site unspecified: Secondary | ICD-10-CM | POA: Diagnosis not present

## 2015-11-24 DIAGNOSIS — G894 Chronic pain syndrome: Secondary | ICD-10-CM | POA: Diagnosis not present

## 2015-11-24 DIAGNOSIS — M1991 Primary osteoarthritis, unspecified site: Secondary | ICD-10-CM | POA: Diagnosis not present

## 2015-11-24 DIAGNOSIS — J189 Pneumonia, unspecified organism: Secondary | ICD-10-CM | POA: Diagnosis not present

## 2015-11-24 DIAGNOSIS — I10 Essential (primary) hypertension: Secondary | ICD-10-CM | POA: Diagnosis not present

## 2015-11-26 DIAGNOSIS — J189 Pneumonia, unspecified organism: Secondary | ICD-10-CM | POA: Diagnosis not present

## 2015-11-26 DIAGNOSIS — G894 Chronic pain syndrome: Secondary | ICD-10-CM | POA: Diagnosis not present

## 2015-11-26 DIAGNOSIS — J440 Chronic obstructive pulmonary disease with acute lower respiratory infection: Secondary | ICD-10-CM | POA: Diagnosis not present

## 2015-11-26 DIAGNOSIS — I872 Venous insufficiency (chronic) (peripheral): Secondary | ICD-10-CM | POA: Diagnosis not present

## 2015-11-26 DIAGNOSIS — I10 Essential (primary) hypertension: Secondary | ICD-10-CM | POA: Diagnosis not present

## 2015-11-26 DIAGNOSIS — M1991 Primary osteoarthritis, unspecified site: Secondary | ICD-10-CM | POA: Diagnosis not present

## 2015-11-26 DIAGNOSIS — I480 Paroxysmal atrial fibrillation: Secondary | ICD-10-CM | POA: Diagnosis not present

## 2015-11-26 DIAGNOSIS — R600 Localized edema: Secondary | ICD-10-CM | POA: Diagnosis not present

## 2015-11-26 DIAGNOSIS — M4800 Spinal stenosis, site unspecified: Secondary | ICD-10-CM | POA: Diagnosis not present

## 2015-11-26 DIAGNOSIS — S81802D Unspecified open wound, left lower leg, subsequent encounter: Secondary | ICD-10-CM | POA: Diagnosis not present

## 2015-11-26 DIAGNOSIS — J441 Chronic obstructive pulmonary disease with (acute) exacerbation: Secondary | ICD-10-CM | POA: Diagnosis not present

## 2015-11-27 DIAGNOSIS — S79911A Unspecified injury of right hip, initial encounter: Secondary | ICD-10-CM | POA: Diagnosis not present

## 2015-11-27 DIAGNOSIS — S300XXA Contusion of lower back and pelvis, initial encounter: Secondary | ICD-10-CM | POA: Diagnosis not present

## 2015-11-27 DIAGNOSIS — W19XXXA Unspecified fall, initial encounter: Secondary | ICD-10-CM | POA: Diagnosis not present

## 2015-11-27 DIAGNOSIS — J449 Chronic obstructive pulmonary disease, unspecified: Secondary | ICD-10-CM | POA: Diagnosis not present

## 2015-11-27 DIAGNOSIS — S62330A Displaced fracture of neck of second metacarpal bone, right hand, initial encounter for closed fracture: Secondary | ICD-10-CM | POA: Diagnosis not present

## 2015-11-27 DIAGNOSIS — S79912A Unspecified injury of left hip, initial encounter: Secondary | ICD-10-CM | POA: Diagnosis not present

## 2015-11-27 DIAGNOSIS — W07XXXA Fall from chair, initial encounter: Secondary | ICD-10-CM | POA: Diagnosis not present

## 2015-11-27 DIAGNOSIS — R262 Difficulty in walking, not elsewhere classified: Secondary | ICD-10-CM | POA: Diagnosis not present

## 2015-11-28 DIAGNOSIS — W19XXXA Unspecified fall, initial encounter: Secondary | ICD-10-CM | POA: Diagnosis not present

## 2015-11-28 DIAGNOSIS — R262 Difficulty in walking, not elsewhere classified: Secondary | ICD-10-CM | POA: Diagnosis not present

## 2015-11-29 DIAGNOSIS — Z9981 Dependence on supplemental oxygen: Secondary | ICD-10-CM | POA: Diagnosis not present

## 2015-11-29 DIAGNOSIS — Z888 Allergy status to other drugs, medicaments and biological substances status: Secondary | ICD-10-CM | POA: Diagnosis not present

## 2015-11-29 DIAGNOSIS — J449 Chronic obstructive pulmonary disease, unspecified: Secondary | ICD-10-CM | POA: Diagnosis not present

## 2015-11-29 DIAGNOSIS — I480 Paroxysmal atrial fibrillation: Secondary | ICD-10-CM | POA: Diagnosis not present

## 2015-11-29 DIAGNOSIS — R2681 Unsteadiness on feet: Secondary | ICD-10-CM | POA: Diagnosis not present

## 2015-11-29 DIAGNOSIS — E271 Primary adrenocortical insufficiency: Secondary | ICD-10-CM | POA: Diagnosis not present

## 2015-11-29 DIAGNOSIS — S62390A Other fracture of second metacarpal bone, right hand, initial encounter for closed fracture: Secondary | ICD-10-CM | POA: Diagnosis not present

## 2015-11-29 DIAGNOSIS — K219 Gastro-esophageal reflux disease without esophagitis: Secondary | ICD-10-CM | POA: Diagnosis not present

## 2015-11-29 DIAGNOSIS — R0902 Hypoxemia: Secondary | ICD-10-CM | POA: Diagnosis not present

## 2015-11-29 DIAGNOSIS — I10 Essential (primary) hypertension: Secondary | ICD-10-CM | POA: Diagnosis not present

## 2015-11-29 DIAGNOSIS — R0602 Shortness of breath: Secondary | ICD-10-CM | POA: Diagnosis not present

## 2015-11-29 DIAGNOSIS — M4800 Spinal stenosis, site unspecified: Secondary | ICD-10-CM | POA: Diagnosis not present

## 2015-11-29 DIAGNOSIS — E279 Disorder of adrenal gland, unspecified: Secondary | ICD-10-CM | POA: Diagnosis not present

## 2015-11-29 DIAGNOSIS — Z7901 Long term (current) use of anticoagulants: Secondary | ICD-10-CM | POA: Diagnosis not present

## 2015-11-29 DIAGNOSIS — R262 Difficulty in walking, not elsewhere classified: Secondary | ICD-10-CM | POA: Diagnosis not present

## 2015-11-29 DIAGNOSIS — G8929 Other chronic pain: Secondary | ICD-10-CM | POA: Diagnosis not present

## 2015-11-29 DIAGNOSIS — I482 Chronic atrial fibrillation: Secondary | ICD-10-CM | POA: Diagnosis not present

## 2015-11-29 DIAGNOSIS — M48061 Spinal stenosis, lumbar region without neurogenic claudication: Secondary | ICD-10-CM | POA: Diagnosis not present

## 2015-11-29 DIAGNOSIS — L309 Dermatitis, unspecified: Secondary | ICD-10-CM | POA: Diagnosis not present

## 2015-11-29 DIAGNOSIS — S62300D Unspecified fracture of second metacarpal bone, right hand, subsequent encounter for fracture with routine healing: Secondary | ICD-10-CM | POA: Diagnosis not present

## 2015-11-29 DIAGNOSIS — G4733 Obstructive sleep apnea (adult) (pediatric): Secondary | ICD-10-CM | POA: Diagnosis not present

## 2015-11-29 DIAGNOSIS — M545 Low back pain: Secondary | ICD-10-CM | POA: Diagnosis not present

## 2015-11-29 DIAGNOSIS — S3992XA Unspecified injury of lower back, initial encounter: Secondary | ICD-10-CM | POA: Diagnosis not present

## 2015-11-29 DIAGNOSIS — Z86718 Personal history of other venous thrombosis and embolism: Secondary | ICD-10-CM | POA: Diagnosis not present

## 2015-11-29 DIAGNOSIS — M6281 Muscle weakness (generalized): Secondary | ICD-10-CM | POA: Diagnosis not present

## 2015-11-29 DIAGNOSIS — Z880 Allergy status to penicillin: Secondary | ICD-10-CM | POA: Diagnosis not present

## 2015-11-29 DIAGNOSIS — R609 Edema, unspecified: Secondary | ICD-10-CM | POA: Diagnosis not present

## 2015-11-29 DIAGNOSIS — R600 Localized edema: Secondary | ICD-10-CM | POA: Diagnosis not present

## 2015-11-29 DIAGNOSIS — S300XXS Contusion of lower back and pelvis, sequela: Secondary | ICD-10-CM | POA: Diagnosis not present

## 2015-11-29 DIAGNOSIS — M549 Dorsalgia, unspecified: Secondary | ICD-10-CM | POA: Diagnosis not present

## 2015-11-29 DIAGNOSIS — R339 Retention of urine, unspecified: Secondary | ICD-10-CM | POA: Diagnosis not present

## 2015-11-29 DIAGNOSIS — R531 Weakness: Secondary | ICD-10-CM | POA: Diagnosis not present

## 2015-11-29 DIAGNOSIS — Z886 Allergy status to analgesic agent status: Secondary | ICD-10-CM | POA: Diagnosis not present

## 2015-11-29 DIAGNOSIS — W19XXXA Unspecified fall, initial encounter: Secondary | ICD-10-CM | POA: Diagnosis not present

## 2015-11-29 DIAGNOSIS — S300XXA Contusion of lower back and pelvis, initial encounter: Secondary | ICD-10-CM | POA: Diagnosis not present

## 2015-11-29 DIAGNOSIS — S300XXD Contusion of lower back and pelvis, subsequent encounter: Secondary | ICD-10-CM | POA: Diagnosis not present

## 2015-11-29 DIAGNOSIS — Z23 Encounter for immunization: Secondary | ICD-10-CM | POA: Diagnosis not present

## 2015-11-29 DIAGNOSIS — Z882 Allergy status to sulfonamides status: Secondary | ICD-10-CM | POA: Diagnosis not present

## 2015-12-03 DIAGNOSIS — Z5181 Encounter for therapeutic drug level monitoring: Secondary | ICD-10-CM | POA: Diagnosis not present

## 2015-12-03 DIAGNOSIS — Z86718 Personal history of other venous thrombosis and embolism: Secondary | ICD-10-CM | POA: Diagnosis not present

## 2015-12-03 DIAGNOSIS — I10 Essential (primary) hypertension: Secondary | ICD-10-CM | POA: Diagnosis not present

## 2015-12-03 DIAGNOSIS — E784 Other hyperlipidemia: Secondary | ICD-10-CM | POA: Diagnosis not present

## 2015-12-03 DIAGNOSIS — R262 Difficulty in walking, not elsewhere classified: Secondary | ICD-10-CM | POA: Diagnosis not present

## 2015-12-03 DIAGNOSIS — Z23 Encounter for immunization: Secondary | ICD-10-CM | POA: Diagnosis not present

## 2015-12-03 DIAGNOSIS — R2681 Unsteadiness on feet: Secondary | ICD-10-CM | POA: Diagnosis not present

## 2015-12-03 DIAGNOSIS — S300XXD Contusion of lower back and pelvis, subsequent encounter: Secondary | ICD-10-CM | POA: Diagnosis not present

## 2015-12-03 DIAGNOSIS — R791 Abnormal coagulation profile: Secondary | ICD-10-CM | POA: Diagnosis not present

## 2015-12-03 DIAGNOSIS — R600 Localized edema: Secondary | ICD-10-CM | POA: Diagnosis not present

## 2015-12-03 DIAGNOSIS — M545 Low back pain: Secondary | ICD-10-CM | POA: Diagnosis not present

## 2015-12-03 DIAGNOSIS — S62300D Unspecified fracture of second metacarpal bone, right hand, subsequent encounter for fracture with routine healing: Secondary | ICD-10-CM | POA: Diagnosis not present

## 2015-12-03 DIAGNOSIS — S300XXS Contusion of lower back and pelvis, sequela: Secondary | ICD-10-CM | POA: Diagnosis not present

## 2015-12-03 DIAGNOSIS — E279 Disorder of adrenal gland, unspecified: Secondary | ICD-10-CM | POA: Diagnosis not present

## 2015-12-03 DIAGNOSIS — R339 Retention of urine, unspecified: Secondary | ICD-10-CM | POA: Diagnosis not present

## 2015-12-03 DIAGNOSIS — K219 Gastro-esophageal reflux disease without esophagitis: Secondary | ICD-10-CM | POA: Diagnosis not present

## 2015-12-03 DIAGNOSIS — E038 Other specified hypothyroidism: Secondary | ICD-10-CM | POA: Diagnosis not present

## 2015-12-03 DIAGNOSIS — S62348A Nondisplaced fracture of base of other metacarpal bone, initial encounter for closed fracture: Secondary | ICD-10-CM | POA: Diagnosis not present

## 2015-12-03 DIAGNOSIS — G4733 Obstructive sleep apnea (adult) (pediatric): Secondary | ICD-10-CM | POA: Diagnosis not present

## 2015-12-03 DIAGNOSIS — I480 Paroxysmal atrial fibrillation: Secondary | ICD-10-CM | POA: Diagnosis not present

## 2015-12-03 DIAGNOSIS — Z79899 Other long term (current) drug therapy: Secondary | ICD-10-CM | POA: Diagnosis not present

## 2015-12-03 DIAGNOSIS — J441 Chronic obstructive pulmonary disease with (acute) exacerbation: Secondary | ICD-10-CM | POA: Diagnosis not present

## 2015-12-03 DIAGNOSIS — J449 Chronic obstructive pulmonary disease, unspecified: Secondary | ICD-10-CM | POA: Diagnosis not present

## 2015-12-03 DIAGNOSIS — Z7901 Long term (current) use of anticoagulants: Secondary | ICD-10-CM | POA: Diagnosis not present

## 2015-12-03 DIAGNOSIS — M6281 Muscle weakness (generalized): Secondary | ICD-10-CM | POA: Diagnosis not present

## 2015-12-03 DIAGNOSIS — N390 Urinary tract infection, site not specified: Secondary | ICD-10-CM | POA: Diagnosis not present

## 2015-12-04 DIAGNOSIS — J441 Chronic obstructive pulmonary disease with (acute) exacerbation: Secondary | ICD-10-CM | POA: Diagnosis not present

## 2015-12-04 DIAGNOSIS — E784 Other hyperlipidemia: Secondary | ICD-10-CM | POA: Diagnosis not present

## 2015-12-04 DIAGNOSIS — E038 Other specified hypothyroidism: Secondary | ICD-10-CM | POA: Diagnosis not present

## 2015-12-04 DIAGNOSIS — S62348A Nondisplaced fracture of base of other metacarpal bone, initial encounter for closed fracture: Secondary | ICD-10-CM | POA: Diagnosis not present

## 2015-12-08 ENCOUNTER — Other Ambulatory Visit: Payer: Medicare Other

## 2015-12-08 ENCOUNTER — Ambulatory Visit: Payer: Medicare Other | Admitting: Thoracic Surgery (Cardiothoracic Vascular Surgery)

## 2015-12-21 DIAGNOSIS — Z5181 Encounter for therapeutic drug level monitoring: Secondary | ICD-10-CM | POA: Diagnosis not present

## 2015-12-21 DIAGNOSIS — Z79899 Other long term (current) drug therapy: Secondary | ICD-10-CM | POA: Diagnosis not present

## 2015-12-29 ENCOUNTER — Ambulatory Visit: Payer: Medicare Other | Admitting: Thoracic Surgery (Cardiothoracic Vascular Surgery)

## 2015-12-29 ENCOUNTER — Other Ambulatory Visit: Payer: Medicare Other

## 2016-01-06 ENCOUNTER — Ambulatory Visit: Payer: Medicare Other | Admitting: Orthopaedic Surgery

## 2016-01-12 DIAGNOSIS — J449 Chronic obstructive pulmonary disease, unspecified: Secondary | ICD-10-CM | POA: Diagnosis not present

## 2016-01-12 DIAGNOSIS — M6281 Muscle weakness (generalized): Secondary | ICD-10-CM | POA: Diagnosis not present

## 2016-01-12 DIAGNOSIS — R0602 Shortness of breath: Secondary | ICD-10-CM | POA: Diagnosis not present

## 2016-01-12 DIAGNOSIS — R269 Unspecified abnormalities of gait and mobility: Secondary | ICD-10-CM | POA: Diagnosis not present

## 2016-01-12 DIAGNOSIS — I4891 Unspecified atrial fibrillation: Secondary | ICD-10-CM | POA: Diagnosis not present

## 2016-01-13 DIAGNOSIS — Z79891 Long term (current) use of opiate analgesic: Secondary | ICD-10-CM | POA: Diagnosis not present

## 2016-01-13 DIAGNOSIS — E039 Hypothyroidism, unspecified: Secondary | ICD-10-CM | POA: Diagnosis not present

## 2016-01-13 DIAGNOSIS — R600 Localized edema: Secondary | ICD-10-CM | POA: Diagnosis not present

## 2016-01-13 DIAGNOSIS — I10 Essential (primary) hypertension: Secondary | ICD-10-CM | POA: Diagnosis not present

## 2016-01-13 DIAGNOSIS — Z7901 Long term (current) use of anticoagulants: Secondary | ICD-10-CM | POA: Diagnosis not present

## 2016-01-13 DIAGNOSIS — Z792 Long term (current) use of antibiotics: Secondary | ICD-10-CM | POA: Diagnosis not present

## 2016-01-13 DIAGNOSIS — I480 Paroxysmal atrial fibrillation: Secondary | ICD-10-CM | POA: Diagnosis not present

## 2016-01-13 DIAGNOSIS — S62300D Unspecified fracture of second metacarpal bone, right hand, subsequent encounter for fracture with routine healing: Secondary | ICD-10-CM | POA: Diagnosis not present

## 2016-01-13 DIAGNOSIS — J449 Chronic obstructive pulmonary disease, unspecified: Secondary | ICD-10-CM | POA: Diagnosis not present

## 2016-01-13 DIAGNOSIS — M4807 Spinal stenosis, lumbosacral region: Secondary | ICD-10-CM | POA: Diagnosis not present

## 2016-01-13 DIAGNOSIS — G4733 Obstructive sleep apnea (adult) (pediatric): Secondary | ICD-10-CM | POA: Diagnosis not present

## 2016-01-13 DIAGNOSIS — I82411 Acute embolism and thrombosis of right femoral vein: Secondary | ICD-10-CM | POA: Diagnosis not present

## 2016-01-13 DIAGNOSIS — E279 Disorder of adrenal gland, unspecified: Secondary | ICD-10-CM | POA: Diagnosis not present

## 2016-01-13 DIAGNOSIS — Z9981 Dependence on supplemental oxygen: Secondary | ICD-10-CM | POA: Diagnosis not present

## 2016-01-13 DIAGNOSIS — I82811 Embolism and thrombosis of superficial veins of right lower extremities: Secondary | ICD-10-CM | POA: Diagnosis not present

## 2016-01-14 DIAGNOSIS — R58 Hemorrhage, not elsewhere classified: Secondary | ICD-10-CM | POA: Diagnosis not present

## 2016-01-14 DIAGNOSIS — R2681 Unsteadiness on feet: Secondary | ICD-10-CM | POA: Diagnosis not present

## 2016-01-14 DIAGNOSIS — G894 Chronic pain syndrome: Secondary | ICD-10-CM | POA: Diagnosis not present

## 2016-01-14 DIAGNOSIS — G959 Disease of spinal cord, unspecified: Secondary | ICD-10-CM | POA: Diagnosis not present

## 2016-01-14 DIAGNOSIS — J9610 Chronic respiratory failure, unspecified whether with hypoxia or hypercapnia: Secondary | ICD-10-CM | POA: Diagnosis not present

## 2016-01-15 DIAGNOSIS — I480 Paroxysmal atrial fibrillation: Secondary | ICD-10-CM | POA: Diagnosis not present

## 2016-01-15 DIAGNOSIS — I82411 Acute embolism and thrombosis of right femoral vein: Secondary | ICD-10-CM | POA: Diagnosis not present

## 2016-01-15 DIAGNOSIS — Z7901 Long term (current) use of anticoagulants: Secondary | ICD-10-CM | POA: Diagnosis not present

## 2016-01-15 DIAGNOSIS — Z792 Long term (current) use of antibiotics: Secondary | ICD-10-CM | POA: Diagnosis not present

## 2016-01-15 DIAGNOSIS — R600 Localized edema: Secondary | ICD-10-CM | POA: Diagnosis not present

## 2016-01-15 DIAGNOSIS — Z9981 Dependence on supplemental oxygen: Secondary | ICD-10-CM | POA: Diagnosis not present

## 2016-01-15 DIAGNOSIS — G4733 Obstructive sleep apnea (adult) (pediatric): Secondary | ICD-10-CM | POA: Diagnosis not present

## 2016-01-15 DIAGNOSIS — I10 Essential (primary) hypertension: Secondary | ICD-10-CM | POA: Diagnosis not present

## 2016-01-15 DIAGNOSIS — E039 Hypothyroidism, unspecified: Secondary | ICD-10-CM | POA: Diagnosis not present

## 2016-01-15 DIAGNOSIS — S62300D Unspecified fracture of second metacarpal bone, right hand, subsequent encounter for fracture with routine healing: Secondary | ICD-10-CM | POA: Diagnosis not present

## 2016-01-15 DIAGNOSIS — I82811 Embolism and thrombosis of superficial veins of right lower extremities: Secondary | ICD-10-CM | POA: Diagnosis not present

## 2016-01-15 DIAGNOSIS — M4807 Spinal stenosis, lumbosacral region: Secondary | ICD-10-CM | POA: Diagnosis not present

## 2016-01-15 DIAGNOSIS — E279 Disorder of adrenal gland, unspecified: Secondary | ICD-10-CM | POA: Diagnosis not present

## 2016-01-15 DIAGNOSIS — Z79891 Long term (current) use of opiate analgesic: Secondary | ICD-10-CM | POA: Diagnosis not present

## 2016-01-15 DIAGNOSIS — J449 Chronic obstructive pulmonary disease, unspecified: Secondary | ICD-10-CM | POA: Diagnosis not present

## 2016-01-16 DIAGNOSIS — G4733 Obstructive sleep apnea (adult) (pediatric): Secondary | ICD-10-CM | POA: Diagnosis not present

## 2016-01-16 DIAGNOSIS — Z7901 Long term (current) use of anticoagulants: Secondary | ICD-10-CM | POA: Diagnosis not present

## 2016-01-16 DIAGNOSIS — E039 Hypothyroidism, unspecified: Secondary | ICD-10-CM | POA: Diagnosis not present

## 2016-01-16 DIAGNOSIS — Z79891 Long term (current) use of opiate analgesic: Secondary | ICD-10-CM | POA: Diagnosis not present

## 2016-01-16 DIAGNOSIS — I82411 Acute embolism and thrombosis of right femoral vein: Secondary | ICD-10-CM | POA: Diagnosis not present

## 2016-01-16 DIAGNOSIS — I480 Paroxysmal atrial fibrillation: Secondary | ICD-10-CM | POA: Diagnosis not present

## 2016-01-16 DIAGNOSIS — S62300D Unspecified fracture of second metacarpal bone, right hand, subsequent encounter for fracture with routine healing: Secondary | ICD-10-CM | POA: Diagnosis not present

## 2016-01-16 DIAGNOSIS — E279 Disorder of adrenal gland, unspecified: Secondary | ICD-10-CM | POA: Diagnosis not present

## 2016-01-16 DIAGNOSIS — Z9981 Dependence on supplemental oxygen: Secondary | ICD-10-CM | POA: Diagnosis not present

## 2016-01-16 DIAGNOSIS — R600 Localized edema: Secondary | ICD-10-CM | POA: Diagnosis not present

## 2016-01-16 DIAGNOSIS — Z792 Long term (current) use of antibiotics: Secondary | ICD-10-CM | POA: Diagnosis not present

## 2016-01-16 DIAGNOSIS — I82811 Embolism and thrombosis of superficial veins of right lower extremities: Secondary | ICD-10-CM | POA: Diagnosis not present

## 2016-01-16 DIAGNOSIS — J449 Chronic obstructive pulmonary disease, unspecified: Secondary | ICD-10-CM | POA: Diagnosis not present

## 2016-01-16 DIAGNOSIS — I10 Essential (primary) hypertension: Secondary | ICD-10-CM | POA: Diagnosis not present

## 2016-01-16 DIAGNOSIS — M4807 Spinal stenosis, lumbosacral region: Secondary | ICD-10-CM | POA: Diagnosis not present

## 2016-01-21 ENCOUNTER — Inpatient Hospital Stay (HOSPITAL_COMMUNITY)
Admission: EM | Admit: 2016-01-21 | Discharge: 2016-02-15 | DRG: 297 | Disposition: E | Payer: Medicare Other | Attending: Pulmonary Disease | Admitting: Pulmonary Disease

## 2016-01-21 ENCOUNTER — Emergency Department (HOSPITAL_COMMUNITY): Payer: Medicare Other

## 2016-01-21 ENCOUNTER — Encounter (HOSPITAL_COMMUNITY): Payer: Self-pay | Admitting: Emergency Medicine

## 2016-01-21 DIAGNOSIS — Z886 Allergy status to analgesic agent status: Secondary | ICD-10-CM

## 2016-01-21 DIAGNOSIS — Z9981 Dependence on supplemental oxygen: Secondary | ICD-10-CM

## 2016-01-21 DIAGNOSIS — Z66 Do not resuscitate: Secondary | ICD-10-CM | POA: Diagnosis not present

## 2016-01-21 DIAGNOSIS — R402432 Glasgow coma scale score 3-8, at arrival to emergency department: Secondary | ICD-10-CM | POA: Diagnosis present

## 2016-01-21 DIAGNOSIS — Z7901 Long term (current) use of anticoagulants: Secondary | ICD-10-CM

## 2016-01-21 DIAGNOSIS — M1990 Unspecified osteoarthritis, unspecified site: Secondary | ICD-10-CM | POA: Diagnosis not present

## 2016-01-21 DIAGNOSIS — Z6839 Body mass index (BMI) 39.0-39.9, adult: Secondary | ICD-10-CM | POA: Diagnosis not present

## 2016-01-21 DIAGNOSIS — J449 Chronic obstructive pulmonary disease, unspecified: Secondary | ICD-10-CM | POA: Diagnosis present

## 2016-01-21 DIAGNOSIS — I482 Chronic atrial fibrillation: Secondary | ICD-10-CM | POA: Diagnosis not present

## 2016-01-21 DIAGNOSIS — Z9114 Patient's other noncompliance with medication regimen: Secondary | ICD-10-CM

## 2016-01-21 DIAGNOSIS — Z79890 Hormone replacement therapy (postmenopausal): Secondary | ICD-10-CM

## 2016-01-21 DIAGNOSIS — Z86718 Personal history of other venous thrombosis and embolism: Secondary | ICD-10-CM | POA: Diagnosis not present

## 2016-01-21 DIAGNOSIS — R404 Transient alteration of awareness: Secondary | ICD-10-CM | POA: Diagnosis not present

## 2016-01-21 DIAGNOSIS — Z7952 Long term (current) use of systemic steroids: Secondary | ICD-10-CM | POA: Diagnosis not present

## 2016-01-21 DIAGNOSIS — Z79899 Other long term (current) drug therapy: Secondary | ICD-10-CM

## 2016-01-21 DIAGNOSIS — I714 Abdominal aortic aneurysm, without rupture: Secondary | ICD-10-CM | POA: Diagnosis not present

## 2016-01-21 DIAGNOSIS — Z8679 Personal history of other diseases of the circulatory system: Secondary | ICD-10-CM

## 2016-01-21 DIAGNOSIS — R570 Cardiogenic shock: Secondary | ICD-10-CM | POA: Insufficient documentation

## 2016-01-21 DIAGNOSIS — Z880 Allergy status to penicillin: Secondary | ICD-10-CM | POA: Diagnosis not present

## 2016-01-21 DIAGNOSIS — E872 Acidosis: Secondary | ICD-10-CM | POA: Diagnosis present

## 2016-01-21 DIAGNOSIS — Z87891 Personal history of nicotine dependence: Secondary | ICD-10-CM

## 2016-01-21 DIAGNOSIS — Z882 Allergy status to sulfonamides status: Secondary | ICD-10-CM

## 2016-01-21 DIAGNOSIS — I959 Hypotension, unspecified: Secondary | ICD-10-CM | POA: Diagnosis present

## 2016-01-21 DIAGNOSIS — E1151 Type 2 diabetes mellitus with diabetic peripheral angiopathy without gangrene: Secondary | ICD-10-CM | POA: Diagnosis present

## 2016-01-21 DIAGNOSIS — G4733 Obstructive sleep apnea (adult) (pediatric): Secondary | ICD-10-CM | POA: Diagnosis present

## 2016-01-21 DIAGNOSIS — Z9119 Patient's noncompliance with other medical treatment and regimen: Secondary | ICD-10-CM | POA: Diagnosis not present

## 2016-01-21 DIAGNOSIS — Z515 Encounter for palliative care: Secondary | ICD-10-CM | POA: Diagnosis present

## 2016-01-21 DIAGNOSIS — I469 Cardiac arrest, cause unspecified: Principal | ICD-10-CM | POA: Diagnosis present

## 2016-01-21 HISTORY — DX: Chronic obstructive pulmonary disease, unspecified: J44.9

## 2016-01-21 LAB — COMPREHENSIVE METABOLIC PANEL
ALBUMIN: 1.2 g/dL — AB (ref 3.5–5.0)
ALK PHOS: 54 U/L (ref 38–126)
ALT: 107 U/L — AB (ref 17–63)
AST: 131 U/L — ABNORMAL HIGH (ref 15–41)
Anion gap: 13 (ref 5–15)
BUN: 16 mg/dL (ref 6–20)
CALCIUM: 6.6 mg/dL — AB (ref 8.9–10.3)
CHLORIDE: 113 mmol/L — AB (ref 101–111)
CO2: 17 mmol/L — AB (ref 22–32)
CREATININE: 0.93 mg/dL (ref 0.61–1.24)
GFR calc Af Amer: >60 mL/min (ref >60)
GFR calc non Af Amer: >60 mL/min (ref >60)
GLUCOSE: 104 mg/dL — AB (ref 65–99)
Potassium: 4.3 mmol/L (ref 3.5–5.1)
SODIUM: 143 mmol/L (ref 135–145)
Total Bilirubin: 1 mg/dL (ref 0.3–1.2)
Total Protein: 3 g/dL — ABNORMAL LOW (ref 6.5–8.1)

## 2016-01-21 LAB — I-STAT ARTERIAL BLOOD GAS, ED
Acid-base deficit: 7 mmol/L — ABNORMAL HIGH (ref 0.0–2.0)
Bicarbonate: 22.3 mmol/L (ref 20.0–28.0)
O2 SAT: 100 %
PCO2 ART: 64.8 mmHg — AB (ref 32.0–48.0)
PO2 ART: 262 mmHg — AB (ref 83.0–108.0)
Patient temperature: 98.6
TCO2: 24 mmol/L (ref 0–100)
pH, Arterial: 7.144 — CL (ref 7.350–7.450)

## 2016-01-21 LAB — TROPONIN I: Troponin I: 0.26 ng/mL (ref <0.03)

## 2016-01-21 LAB — MAGNESIUM: MAGNESIUM: 1.5 mg/dL — AB (ref 1.7–2.4)

## 2016-01-21 MED ORDER — EPINEPHRINE PF 1 MG/ML IJ SOLN
1.0000 mg | Freq: Once | INTRAMUSCULAR | Status: AC
  Administered 2016-01-21: 1 mg via INTRAVENOUS

## 2016-01-21 MED ORDER — MORPHINE SULFATE (PF) 4 MG/ML IV SOLN
INTRAVENOUS | Status: AC
  Filled 2016-01-21: qty 1

## 2016-01-21 MED ORDER — SODIUM BICARBONATE 8.4 % IV SOLN
50.0000 meq | Freq: Once | INTRAVENOUS | Status: AC
Start: 2016-01-21 — End: 2016-01-21
  Administered 2016-01-21: 50 meq via INTRAVENOUS

## 2016-01-21 MED ORDER — MORPHINE BOLUS VIA INFUSION
5.0000 mg | INTRAVENOUS | Status: DC | PRN
  Filled 2016-01-21: qty 20

## 2016-01-21 MED ORDER — NOREPINEPHRINE BITARTRATE 1 MG/ML IV SOLN
0.0000 ug/min | Freq: Once | INTRAVENOUS | Status: AC
  Administered 2016-01-21: 5.33 ug/min via INTRAVENOUS
  Filled 2016-01-21: qty 4

## 2016-01-21 MED ORDER — SODIUM BICARBONATE 8.4 % IV SOLN
INTRAVENOUS | Status: AC
  Filled 2016-01-21: qty 50

## 2016-01-21 MED ORDER — SODIUM CHLORIDE 0.9 % IV SOLN
10.0000 mg/h | INTRAVENOUS | Status: DC
  Filled 2016-01-21: qty 10

## 2016-01-21 MED ORDER — SODIUM CHLORIDE 0.9 % IV BOLUS (SEPSIS)
1000.0000 mL | Freq: Once | INTRAVENOUS | Status: AC
  Administered 2016-01-21: 1000 mL via INTRAVENOUS

## 2016-01-21 MED ORDER — SODIUM BICARBONATE 8.4 % IV SOLN
100.0000 meq | Freq: Once | INTRAVENOUS | Status: AC
  Administered 2016-01-21: 100 meq via INTRAVENOUS

## 2016-01-21 MED ORDER — STERILE WATER FOR INJECTION IV SOLN
INTRAVENOUS | Status: DC
  Administered 2016-01-21: 03:00:00 via INTRAVENOUS
  Filled 2016-01-21 (×3): qty 850

## 2016-01-21 MED FILL — Medication: Qty: 1 | Status: AC

## 2016-01-25 ENCOUNTER — Telehealth: Payer: Self-pay

## 2016-01-25 NOTE — Telephone Encounter (Signed)
On 01/25/2016 I received a death certificate from [place] (original). The death certificate is for burial. The patient is a patient of Doctor Maneem. The death certificate will be taken to Pulmonary Unit @ [facility] this pm for signature.  On Feb 01, 2016 I received the death certificate back from Doctor Maneem. I got the death certificate ready and called the funeral home to let them know I mailed the death certificate to the [facility] Department per the funeral home request.

## 2016-01-26 ENCOUNTER — Other Ambulatory Visit: Payer: Medicare Other

## 2016-01-26 ENCOUNTER — Ambulatory Visit: Payer: Medicare Other | Admitting: Thoracic Surgery (Cardiothoracic Vascular Surgery)

## 2016-02-15 NOTE — ED Notes (Signed)
Respiratory attempting ABG.  Second phlebotomist at bedside

## 2016-02-15 NOTE — ED Notes (Signed)
Intensivist at bedside communicating with wife

## 2016-02-15 NOTE — ED Notes (Signed)
MD consulting with Critical Care for possible cold cool

## 2016-02-15 NOTE — ED Triage Notes (Signed)
Per EMS:  Pt found by wife down at home, last seen 20 minutes prior.  CPR was done for 25 minutes on scene with ROSC.  6 epinephrines were given, and 2mg  of Narcan.  Cold saline is now currently infusing.  Pt is NSR at this time, HR 90.  A King airway was placed on scene, (#4).  EtCO2 has been 40 en route.  Pt was asystole through-out code.

## 2016-02-15 NOTE — ED Provider Notes (Signed)
[facility] DEPT Provider Note   CSN: [id number] Arrival date & time: 2016/02/16  0053  By signing my name below, I, Emmanuella Mensah, attest that this documentation has been prepared under the direction and in the presence of Delora Fuel, MD. Electronically Signed: Judithann Sauger, ED Scribe. 2016/02/16. 1:11 AM.   History   Chief Complaint Chief Complaint  Patient presents with  . post cpr    HPI Comments: Level 5 Caveat due to pt being unresponsive  [name] is a 76 y.o. male with a hx of COPD, DM, hypertension, Addison's disease, and chronic respiratory failure with hypoxia and hypercapnia brought in by ambulance, who presents to the Emergency Department post CPR. Per EMS, pt was found by wife laying down after being seen normal 20 minutes prior. His wife began CPR prior to EMS arrival and they performed 25 minutes of CPR on scene with ROSC. They also placed a king airway on scene. EMS personnel states that pt regained his pulse approximately 45 minutes PTA. He received 6 epis and 2 mg narcan en route. Pt has an allergy to aspirin, claritin-d, penicillins, and sulfa antibiotics.   The history is provided by the spouse and the EMS personnel. The history is limited by the condition of the patient. No language interpreter was used.    Past Medical History:  Diagnosis Date  . AAA (abdominal aortic aneurysm) ([place])    followed by Dr. Sherren Mocha Early  . Aortic aneurysm without rupture ([place])   . Arthritis   . Cholecystitis   . Clotting disorder ([place])    history of DVT  . Diabetes mellitus    type II  . Hyperlipidemia   . Hypertension   . Lung nodules   . Obesity   . Osteoarthritis   . Venous insufficiency    lower extremities    Patient Active Problem List   Diagnosis Date Noted  . Chronic respiratory failure with hypoxia and hypercapnia ([place]) 12/11/2014  . Obesity hypoventilation syndrome ([place]) 12/11/2014  . Addisons disease ([place]) 12/11/2014  . Noncompliance with CPAP  treatment 12/11/2014  . 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 Breath shortness 10/06/2014  . Snoring 07/01/2013  . Diaphoresis 07/01/2013  . Adrenal insufficiency (Addison's disease) ([place]) 07/01/2013  . Hypogonadism male 07/01/2013  . Hypoxemia 07/01/2013  . Lung nodules   . Aortic aneurysm without rupture ([place])   . AAA (abdominal aortic aneurysm) ([place])   . Hyperlipidemia   . Clotting disorder ([place])   . Arthritis   . Osteoarthritis   . Dyspnea on exertion 05/31/2010    Past Surgical History:  Procedure Laterality Date  . CATARACT EXTRACTION W/PHACO  10/21/2010   Procedure: CATARACT EXTRACTION PHACO AND INTRAOCULAR LENS PLACEMENT (IOC);  Surgeon: Tonny Branch;  Location: [facility];  Service: Ophthalmology;  Laterality: Left;  CDE:16.80  . CHOLECYSTECTOMY    . INGUINAL HERNIA REPAIR    . TESTICLE SURGERY  2004   Dr. [name]  . YAG LASER APPLICATION Right [id number]   Procedure: YAG LASER APPLICATION;  Surgeon: [name], MD;  Location: [facility];  Service: Ophthalmology;  Laterality: Right;       Home Medications    Prior to Admission medications   Medication Sig Start Date End Date Taking? Authorizing Provider  acetaminophen (TYLENOL) 650 MG CR tablet Take 650 mg by mouth every 8 (eight) hours as needed for pain.    Historical Provider, MD  atorvastatin (LIPITOR) 20 MG tablet Take 20 mg by  mouth at bedtime.      Historical Provider, MD  clotrimazole-betamethasone (LOTRISONE) cream Apply 1 application topically 2 (two) times daily.    Historical Provider, MD  dexamethasone (DECADRON) 0.75 MG tablet Take 0.75 mg by mouth daily.     Historical Provider, MD  furosemide (LASIX) 20 MG tablet Take 60 mg by mouth daily.     Historical Provider, MD  gabapentin (NEURONTIN) 100 MG tablet Take 100 mg by mouth 2 (two) times daily.     Historical Provider, MD  HYDROmorphone (DILAUDID) 4 MG tablet Take 4 mg by mouth every 6 (six) hours as needed for moderate pain.   05/22/13   Historical Provider, MD  metoprolol succinate (TOPROL XL) 25 MG 24 hr tablet Take 1 tablet (25 mg total) by mouth daily. 12/09/14   Melrose Nakayama, MD  omeprazole (PRILOSEC) 20 MG capsule Take 20 mg by mouth daily.      Historical Provider, MD  Potassium 99 MG TABS Take 1 tablet by mouth daily.    Historical Provider, MD  predniSONE (DELTASONE) 10 MG tablet Take 10 mg by mouth daily with breakfast. Take 1 in the morning and 1/2 at hs.    Historical Provider, MD  testosterone enanthate (DELATESTRYL) 200 MG/ML injection Inject into the muscle every 14 (fourteen) days. For IM use only    Historical Provider, MD  warfarin (COUMADIN) 2 MG tablet Take 4 mg by mouth daily. Takes with a 7.5 mg tablet for total dose of 9.5 mg     Historical Provider, MD  warfarin (COUMADIN) 7.5 MG tablet Take 4 mg by mouth daily. Takes with a 2 mg tablet for total dose of 9.5 mg     Historical Provider, MD    Family History Family History  Problem Relation Age of Onset  . Heart disease Mother   . Cervical cancer Mother   . Stroke Father   . Diabetes Father   . Arthritis Sister   . Cancer Brother     bladder  . Cancer Sister     cervical  . Pancreatic cancer Other   . Prostate cancer Other   . Anesthesia problems Neg Hx   . Hypotension Neg Hx   . Malignant hyperthermia Neg Hx   . Pseudochol deficiency Neg Hx     Social History Social History  Substance Use Topics  . Smoking status: Former Smoker    Types: Cigarettes    Quit date: 07/24/1999  . Smokeless tobacco: Never Used  . Alcohol use No     Allergies   Aspirin; Claritin-d [loratadine-pseudoephedrine er]; Penicillins; and Sulfa antibiotics   Review of Systems Review of Systems  Unable to perform ROS: Patient unresponsive     Physical Exam Updated Vital Signs There were no vitals taken for this visit.  Physical Exam  Constitutional: He appears well-developed and well-nourished.  Morbidly obese; unresponsive; pt on  ventilator for respirations   HENT:  Head: Normocephalic.  KING airway in place   Eyes:  Pupils 6 mm and non reactive   Neck: No JVD present.  Cardiovascular: Normal rate and regular rhythm.  Exam reveals distant heart sounds.   Heart tones are distance  Pulmonary/Chest: Effort normal and breath sounds normal. He has no wheezes. He has no rales. He exhibits no tenderness.  Abdominal: Soft. Bowel sounds are normal. He exhibits no distension and no mass. There is no tenderness.  Musculoskeletal:  2+ pretibial edema; severe venous stasis changes present. Intraosseous line present in proximal left  tibia  Lymphadenopathy:    He has no cervical adenopathy.  Neurological:  GCS = 3  Skin: Skin is warm and dry. No rash noted.  Nursing note and vitals reviewed.    ED Treatments / Results  DIAGNOSTIC STUDIES: Oxygen Saturation is 81% on RA, low by my interpretation.    COORDINATION OF CARE: 1:04 AM- Pt will receive extensive lab work, chest x-ray, and EKG for further evaluation. He will also receive 1 mg adrenalin and levophed infusion.    Labs (all labs ordered are listed, but only abnormal results are displayed) Labs Reviewed  I-STAT ARTERIAL BLOOD GAS, ED - Abnormal; Notable for the following:       Result Value   pH, Arterial 7.144 (*)    pCO2 arterial 64.8 (*)    pO2, Arterial 262.0 (*)    Acid-base deficit 7.0 (*)    All other components within normal limits  COMPREHENSIVE METABOLIC PANEL  TROPONIN I  CBC WITH DIFFERENTIAL/PLATELET  URINALYSIS, ROUTINE W REFLEX MICROSCOPIC  MAGNESIUM  LACTIC ACID, PLASMA  CBC WITH DIFFERENTIAL/PLATELET  PROTIME-INR    EKG  EKG Interpretation  Date/Time:  02-07-16 00:57:40 EST Ventricular Rate:  108 PR Interval:    QRS Duration: 136 QT Interval:  400 QTC Calculation: 537 R Axis:   -46 Text Interpretation:  Sinus tachycardia Left atrial enlargement IVCD, consider atypical RBBB LVH with IVCD and secondary repol  abnrm Prolonged QT interval When compared with ECG of 10/11/2008, HEART RATE has increased Left ventricular hypertrophy is now Present QT has lengthened Confirmed by Roxanne Mins  MD, Darryel Diodato ([id number]) on 2016-02-07 1:09:44 AM       Radiology Dg Chest Portable 1 View  Result Date: 02-07-16 CLINICAL DATA:  Post CPR EXAM: PORTABLE CHEST 1 VIEW COMPARISON:  12/02/2015 FINDINGS: The endotracheal tube is 2 cm above the carina. Left lateral costophrenic angle is excluded from the image but the visible portions of the lungs demonstrate only streaky opacities. No dense confluent consolidation. No large pneumothorax. IMPRESSION: Satisfactorily positioned ETT. Electronically Signed   By: Andreas Newport M.D.   On: 02-07-16 01:47    Procedures (including critical care time) Procedure Name: Intubation Date/Time: February 07, 2016 1:00 AM Performed by: Roxanne Mins, Sherl Yzaguirre Pre-anesthesia Checklist: Patient identified, Emergency Drugs available, Suction available, Patient being monitored and Timeout performed Oxygen Delivery Method: Ambu bag Preoxygenation: Pre-oxygenation with 100% oxygen Ventilation: Mask ventilation without difficulty Laryngoscope Size: Glidescope and 4 Grade View: Grade I Tube size: 7.5 mm Number of attempts: 1 Airway Equipment and Method: Video-laryngoscopy Placement Confirmation: ETT inserted through vocal cords under direct vision,  Positive ETCO2,  CO2 detector and Breath sounds checked- equal and bilateral Secured at: 23 cm Tube secured with: ETT holder Dental Injury: Teeth and Oropharynx as per pre-operative assessment       Prior to intubation, King airway was removed.  CRITICAL CARE Performed by: Delora Fuel Total critical care time: 60 minutes Critical care time was exclusive of separately billable procedures and treating other patients. Critical care was necessary to treat or prevent imminent or life-threatening deterioration. Critical care was time spent personally by me on the  following activities: development of treatment plan with patient and/or surrogate as well as nursing, discussions with consultants, evaluation of patient's response to treatment, examination of patient, obtaining history from patient or surrogate, ordering and performing treatments and interventions, ordering and review of laboratory studies, ordering and review of radiographic studies, pulse oximetry and re-evaluation of patient's condition.   Medications Ordered in ED Medications  EPINEPHrine (ADRENALIN) 1 mg (1 mg Intravenous Given 02/06/16 0110)  norepinephrine (LEVOPHED) 4 mg in dextrose 5 % 250 mL (0.016 mg/mL) infusion (30 mcg/min Intravenous Rate/Dose Change 2016/02/06 0142)  sodium chloride 0.9 % bolus 1,000 mL (1,000 mLs Intravenous New Bag/Given 02-06-2016 0125)  sodium bicarbonate injection 100 mEq (100 mEq Intravenous Given Feb 06, 2016 0140)     Initial Impression / Assessment and Plan / ED Course  Delora Fuel, MD has reviewed the triage vital signs and the nursing notes.  Pertinent labs & imaging results that were available during my care of the patient were reviewed by me and considered in my medical decision making (see chart for details).  Clinical Course    Out of hospital arrest with return of spontaneous circulation. Unfortunately, patient is not showing any neurologic signs of life with GCS of 3. After arrival, [facility] airway was removed and endotracheal tube placed. He did become hypotensive and this was felt to be secondary to his metabolizing epinephrine which had been given prehospital. He is given additional epinephrine and started on norepinephrine drip. Case is discussed with Dr. Oletta Darter of critical care who agrees to admit the patient. Decision on therapeutic hypothermia will be deferred until intensivist is able to see the patient.  Arterial blood gases come back showing acute respiratory acidosis which is strongly suggestive of the primary event being respiratory arrest.  Final  Clinical Impressions(s) / ED Diagnoses   Final diagnoses:  Cardiac arrest [facility])    New Prescriptions New Prescriptions   No medications on file   I personally performed the services described in this documentation, which was scribed in my presence. The recorded information has been reviewed and is accurate.       Delora Fuel, MD [id number] [phone]

## 2016-02-15 NOTE — ED Notes (Signed)
Unsuccessful blood draw x2 

## 2016-02-15 NOTE — ED Notes (Signed)
Pt wedding band left in place. This RN delivered pt glasses to security.

## 2016-02-15 NOTE — ED Notes (Signed)
Critical troponin 0.26. MD notified.

## 2016-02-15 NOTE — ED Notes (Signed)
CC at bedside 

## 2016-02-15 NOTE — H&P (Signed)
PULMONARY / CRITICAL CARE MEDICINE   Name: [name] MRN: [id number] DOB: [birth year]    ADMISSION DATE:  01-22-2016 CONSULTATION DATE:  12/7  REFERRING MD:  Dr. Roxanne Mins  CHIEF COMPLAINT:  Cardiac arrest  HISTORY OF PRESENT ILLNESS:   76 year old male with PMH as below, which is significant for COPD on home O2, small AAA stable on last CT, PVD, DVT on warfarin, DM, and OSA. Wife reports that he was recently discharged from SNF because he did not want to cooperate with treatment there and they were not feeding him enough. He left at his own request. Since returning home he has been refusing several of his medications and had not been wearing his O2 or CPAP. Wife states that he has not been himself the last couple of days. His appetite has been poor which is very unlike him. At times his pulse ox would get as low as the 50s and then she could convince him to put his O2 on. 12/7 early AM she went to bed and about an hour later she found him to be unresponsive with his O2 off. His face was blue. She called EMS and started CPR. EMS arrived and did 25 additional mins CPR with ROSC. Asystole entire code. He was intubated in ED and PCCM called for admission. No labs or imaging had been completed at that time.   PAST MEDICAL HISTORY :  He  has a past medical history of AAA (abdominal aortic aneurysm) ([place]); Aortic aneurysm without rupture ([place]); Arthritis; Cholecystitis; Clotting disorder ([place]); COPD (chronic obstructive pulmonary disease) ([place]); Diabetes mellitus; Hyperlipidemia; Hypertension; Lung nodules; Obesity; Osteoarthritis; and Venous insufficiency.  PAST SURGICAL HISTORY: He  has a past surgical history that includes Cholecystectomy; Inguinal hernia repair; Testicle surgery (2004); Cataract extraction w/PHACO (10/21/2010); and Yag laser application (Right, [id number]).  Allergies  Allergen Reactions  . Aspirin Other (See Comments)    Swells my prostate  . Claritin-D  [Loratadine-Pseudoephedrine Er]   . Penicillins   . Sulfa Antibiotics     Current Facility-Administered Medications on File Prior to Encounter  Medication  . fentaNYL (SUBLIMAZE) injection 25-50 mcg   Current Outpatient Prescriptions on File Prior to Encounter  Medication Sig  . acetaminophen (TYLENOL) 650 MG CR tablet Take 650 mg by mouth every 8 (eight) hours as needed for pain.  Marland Kitchen atorvastatin (LIPITOR) 20 MG tablet Take 20 mg by mouth at bedtime.    . clotrimazole-betamethasone (LOTRISONE) cream Apply 1 application topically 2 (two) times daily.  Marland Kitchen dexamethasone (DECADRON) 0.75 MG tablet Take 0.75 mg by mouth daily.   . furosemide (LASIX) 20 MG tablet Take 60 mg by mouth daily.   Marland Kitchen gabapentin (NEURONTIN) 100 MG tablet Take 100 mg by mouth 2 (two) times daily.   Marland Kitchen HYDROmorphone (DILAUDID) 4 MG tablet Take 4 mg by mouth every 6 (six) hours as needed for moderate pain.   . metoprolol succinate (TOPROL XL) 25 MG 24 hr tablet Take 1 tablet (25 mg total) by mouth daily.  Marland Kitchen omeprazole (PRILOSEC) 20 MG capsule Take 20 mg by mouth daily.    . Potassium 99 MG TABS Take 1 tablet by mouth daily.  . predniSONE (DELTASONE) 10 MG tablet Take 10 mg by mouth daily with breakfast. Take 1 in the morning and 1/2 at hs.  . testosterone enanthate (DELATESTRYL) 200 MG/ML injection Inject into the muscle every 14 (fourteen) days. For IM use only  . warfarin (COUMADIN) 2 MG tablet Take 4 mg by mouth  daily. Takes with a 7.5 mg tablet for total dose of 9.5 mg   . warfarin (COUMADIN) 7.5 MG tablet Take 4 mg by mouth daily. Takes with a 2 mg tablet for total dose of 9.5 mg     FAMILY HISTORY:  His indicated that his mother is deceased. He indicated that his father is deceased. He indicated that one of his two sisters is deceased. He indicated that his brother is alive. He indicated that the status of his neg hx is unknown. He indicated that the status of his other is unknown.    SOCIAL HISTORY: He  reports  that he quit smoking about 16 years ago. His smoking use included Cigarettes. He has never used smokeless tobacco. He reports that he does not drink alcohol or use drugs.  REVIEW OF SYSTEMS:   unable  SUBJECTIVE:  unable  VITAL SIGNS: BP (!) 47/38   Pulse 81   Temp 97.3 F (36.3 C) (Axillary)   Resp 26   Ht 5\' 10"  (1.778 m)   Wt 124.7 kg (275 lb)   SpO2 (!) 84%   BMI 39.46 kg/m   HEMODYNAMICS:    VENTILATOR SETTINGS: Vent Mode: PRVC FiO2 (%):  [100 %] 100 % Set Rate:  [20 bmp] 20 bmp Vt Set:  [550 mL] 550 mL PEEP:  [5 cmH20] 5 cmH20 Plateau Pressure:  [22 cmH20] 22 cmH20  INTAKE / OUTPUT: No intake/output data recorded.  PHYSICAL EXAMINATION: General:  Morbidly obese male on vent Neuro:  Comatose on vent GCS 3 no sedation HEENT:  Ages/AT, L pupil irregular. Both unresponsive to light.  Cardiovascular:  RRR,  No MRG Lungs:  Distant, coarse Abdomen:  Obese, non-distended, soft Musculoskeletal:  No acute deformity Skin:  Grossly intact, ecchymosis all over both arms.   LABS:  BMET No results for input(s): NA, K, CL, CO2, BUN, CREATININE, GLUCOSE in the last 168 hours.  Electrolytes No results for input(s): CALCIUM, MG, PHOS in the last 168 hours.  CBC No results for input(s): WBC, HGB, HCT, PLT in the last 168 hours.  Coag's No results for input(s): APTT, INR in the last 168 hours.  Sepsis Markers No results for input(s): LATICACIDVEN, PROCALCITON, O2SATVEN in the last 168 hours.  ABG  Recent Labs Lab 02/16/2016 0122  PHART 7.144*  PCO2ART 64.8*  PO2ART 262.0*    Liver Enzymes No results for input(s): AST, ALT, ALKPHOS, BILITOT, ALBUMIN in the last 168 hours.  Cardiac Enzymes No results for input(s): TROPONINI, PROBNP in the last 168 hours.  Glucose No results for input(s): GLUCAP in the last 168 hours.  Imaging Dg Chest Portable 1 View  Result Date: February 16, 2016 CLINICAL DATA:  Post CPR EXAM: PORTABLE CHEST 1 VIEW COMPARISON:  12/02/2015  FINDINGS: The endotracheal tube is 2 cm above the carina. Left lateral costophrenic angle is excluded from the image but the visible portions of the lungs demonstrate only streaky opacities. No dense confluent consolidation. No large pneumothorax. IMPRESSION: Satisfactorily positioned ETT. Electronically Signed   By: Andreas Newport M.D.   On: Feb 16, 2016 01:47     STUDIES:    CULTURES:   ANTIBIOTICS:   SIGNIFICANT EVENTS:   LINES/TUBES: ETT 12/7 >  DISCUSSION:   ASSESSMENT / PLAN:  Cardiac arrest: 76 year old male with multiple medical problems, poor medical compliance, and very poor quality of life per his wife. He had unwitnessed cardiac arrest up to 1 hour before being found unresponsive by wife. She started CPR and then EMS did  another 25 mins before ROSC. Oxygen was off when wife found him. Face was blue. I suspect this is related to hypoxia as his wife would often find him with O2 sat in 50s without his O2 on. Wife here at bedside and we had long discussion about goals of care in setting of poor quality of life and now prolonged cardiac arrest. She wishes to pursue comfort measures at this point understanding that even with aggressive medical care he has a poor prognosis. - Continue vent and pressors for now with no escalation - Will get him started on morphine infusion - Admit to ICU and plan to withdrawal care once up there  Georgann Housekeeper, AGACNP-BC [place] Pager [phone] or [phone]  Jan 22, 2016 2:47 AM   Attending note: I have seen and examined the patient with nurse practitioner/resident and agree with the note. History, labs and imaging reviewed.  76 Y/O with multiple comorbidities admitted after prolonged PEA arrest. He has a poor prognosis for recovery and poor quality of life at baseline. After discussion with wife the decision was made to transition to comfort care.  Pt passed away at 3:04 AM in ED before he could be moved to  ICU and extubated.   The patient is critically ill with multiple organ systems failure and requires high complexity decision making for assessment and support, frequent evaluation and titration of therapies, application of advanced monitoring technologies and extensive interpretation of multiple databases.  Critical care time - 35 mins. This represents my time independent of the NPs time taking care of the pt.  Marshell Garfinkel MD [facility] [place] [facility] Pager [phone] If no answer or after 3pm call: [phone] 01/22/2016, 3:13 AM

## 2016-02-15 DEATH — deceased

## 2017-06-12 IMAGING — CT CT ANGIO CHEST
2 of 5 series · 8 of 30 positions shown · IV contrast (isovue)
Comparison: 12/03/2013

CLINICAL DATA: Followup thoracic aortic aneurysm and bilateral
pulmonary nodules

EXAM:
CT ANGIOGRAPHY CHEST WITH CONTRAST
TECHNIQUE: Multidetector CT imaging of the chest was performed using the
standard protocol during bolus administration of intravenous
contrast. Multiplanar CT image reconstructions and MIPs were
obtained to evaluate the vascular anatomy.
CONTRAST:  75 mL Isovue 370

[Series 4: angio · axial · 0.79mm/px · z∈[-187,-40]mm · 3 of 119 slices shown]
[im 30/119  lung]
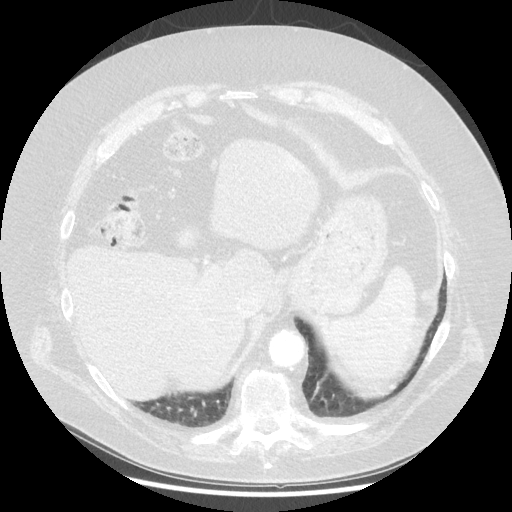
[im 60/119  mediastinal]
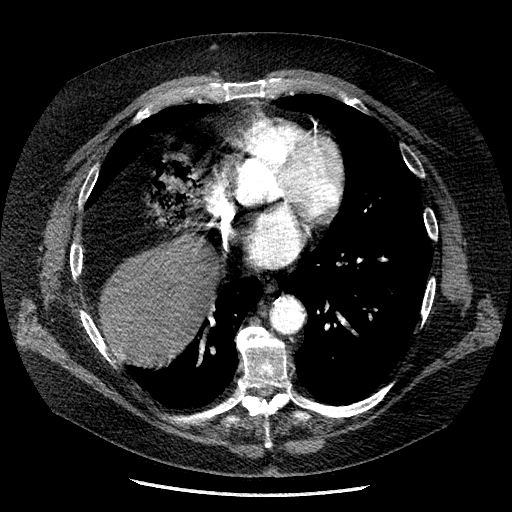
[im 89/119  lung]
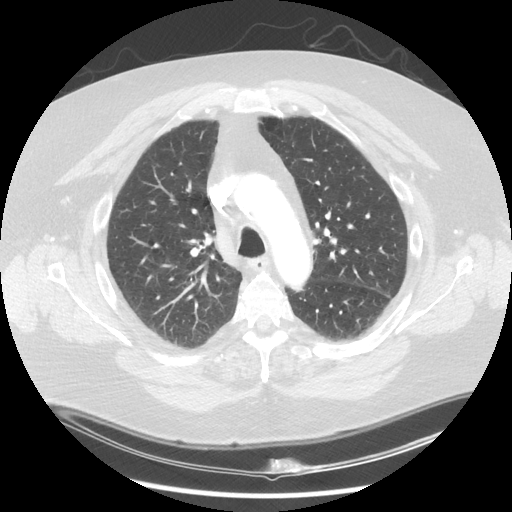

[Series 602: sagittal body · sagittal · 0.79mm/px · 5 of 163 slices shown]
[im 28/163  lung]
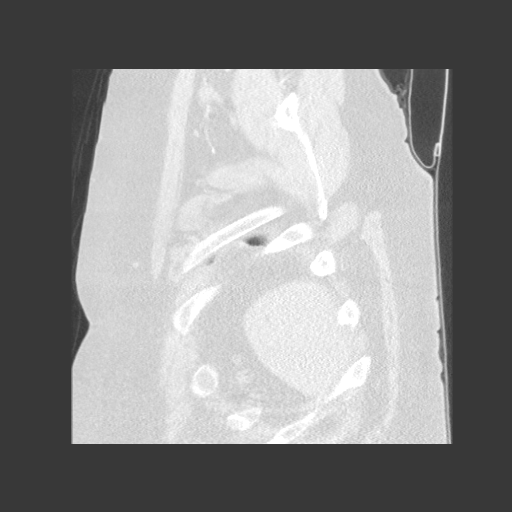
[im 55/163  lung]
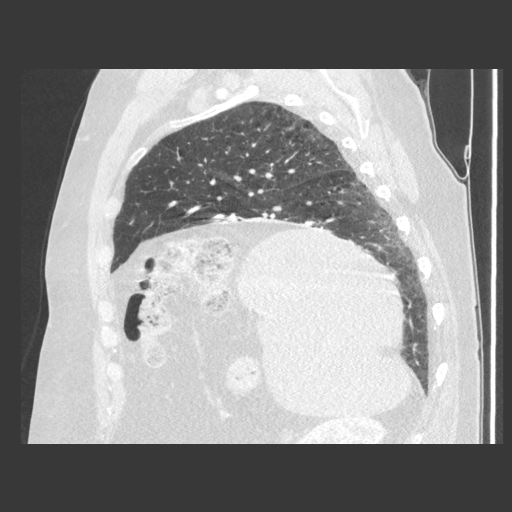
[im 82/163  lung]
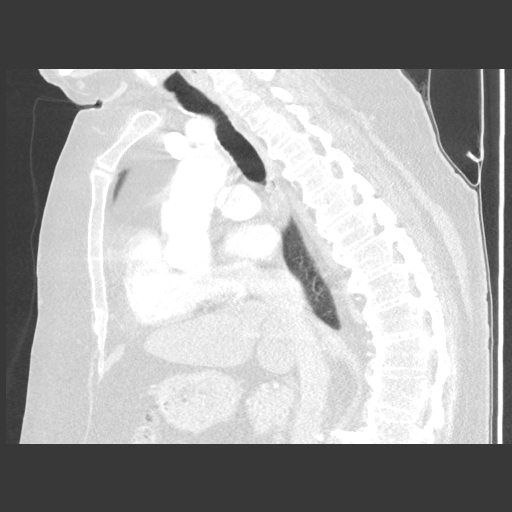
[im 109/163  lung]
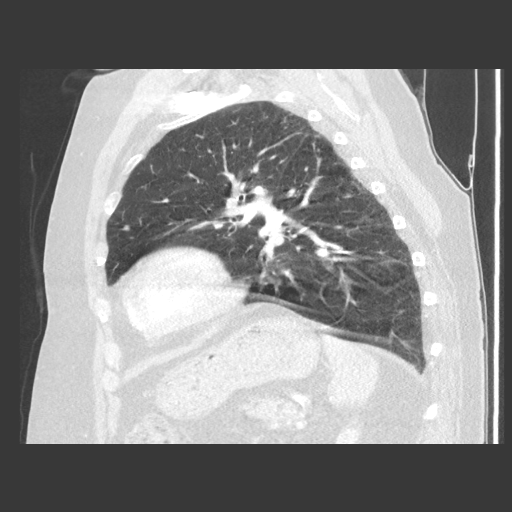
[im 136/163  lung]
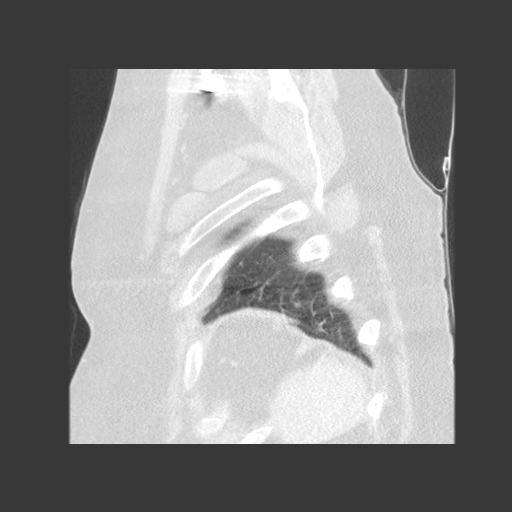

[8 of 30 positions shown; findings below may reference images not displayed]

FINDINGS: The lungs are well aerated bilaterally without focal infiltrate or
sizable effusion. Bilateral small lower lobe nodules are again seen
and stable from the prior study. These again have been stable from
3805 and are consistent with benign nodules.

The thoracic inlet is within normal limits. The thoracic aorta
demonstrates some mild calcific changes. Mild aneurysmal dilatation
is noted to 4.2 cm. Given some slight variation in the imaging
technique this likely is stable in appearance. No evidence of
dissection is identified. The descending thoracic aorta tapers in a
normal fashion. The origins of the celiac axis and superior
mesenteric artery are within normal limits.

The pulmonary artery shows no evidence of pulmonary emboli. No hilar
or mediastinal adenopathy is noted.

Visualized upper abdomen reveals no acute abnormality. Degenerative
changes of the thoracic spine are seen. No acute bony abnormality is
noted.

Review of the MIP images confirms the above findings.
IMPRESSION: Stable aneurysmal dilatation of the ascending aorta. No dissection
is noted.

Stable lung nodules bilaterally. These have been stable for 3 years
and no further follow-up is necessary.

## 2018-09-25 ENCOUNTER — Ambulatory Visit: Payer: Medicare Other | Admitting: Vascular Surgery
